# Patient Record
Sex: Female | Born: 1945
Health system: Southern US, Community
[De-identification: ages and names within clinical notes are randomized; demographics above are authoritative.]

## PROBLEM LIST (undated history)

## (undated) DIAGNOSIS — B9681 Helicobacter pylori [H. pylori] as the cause of diseases classified elsewhere: Secondary | ICD-10-CM

## (undated) DIAGNOSIS — I4891 Unspecified atrial fibrillation: Secondary | ICD-10-CM

## (undated) DIAGNOSIS — Z9889 Other specified postprocedural states: Secondary | ICD-10-CM

## (undated) DIAGNOSIS — K5281 Eosinophilic gastritis or gastroenteritis: Secondary | ICD-10-CM

## (undated) DIAGNOSIS — M1990 Unspecified osteoarthritis, unspecified site: Secondary | ICD-10-CM

## (undated) DIAGNOSIS — K5790 Diverticulosis of intestine, part unspecified, without perforation or abscess without bleeding: Secondary | ICD-10-CM

## (undated) DIAGNOSIS — E785 Hyperlipidemia, unspecified: Secondary | ICD-10-CM

## (undated) DIAGNOSIS — K219 Gastro-esophageal reflux disease without esophagitis: Secondary | ICD-10-CM

## (undated) DIAGNOSIS — E7800 Pure hypercholesterolemia, unspecified: Secondary | ICD-10-CM

## (undated) DIAGNOSIS — K279 Peptic ulcer, site unspecified, unspecified as acute or chronic, without hemorrhage or perforation: Secondary | ICD-10-CM

## (undated) HISTORY — DX: Eosinophilic gastritis or gastroenteritis: K52.81

## (undated) HISTORY — PX: CHOLECYSTECTOMY: SHX55

## (undated) HISTORY — DX: Gastro-esophageal reflux disease without esophagitis: K21.9

## (undated) HISTORY — DX: Helicobacter pylori (H. pylori) as the cause of diseases classified elsewhere: K27.9

## (undated) HISTORY — PX: BUNIONECTOMY: SHX129

## (undated) HISTORY — DX: Unspecified osteoarthritis, unspecified site: M19.90

## (undated) HISTORY — DX: Diverticulosis of intestine, part unspecified, without perforation or abscess without bleeding: K57.90

## (undated) HISTORY — PX: OTHER SURGICAL HISTORY: SHX169

## (undated) HISTORY — DX: Helicobacter pylori (H. pylori) as the cause of diseases classified elsewhere: B96.81

## (undated) HISTORY — DX: Unspecified atrial fibrillation: I48.91

## (undated) HISTORY — PX: CARPAL TUNNEL RELEASE: SHX101

## (undated) HISTORY — DX: Other specified postprocedural states: Z98.890

## (undated) HISTORY — PX: HAND SURGERY: SHX662

---

## 1999-09-27 ENCOUNTER — Ambulatory Visit (HOSPITAL_BASED_OUTPATIENT_CLINIC_OR_DEPARTMENT_OTHER): Admission: RE | Admit: 1999-09-27 | Discharge: 1999-09-27 | Payer: Self-pay | Admitting: Orthopedic Surgery

## 2000-12-26 ENCOUNTER — Encounter: Payer: Self-pay | Admitting: Rheumatology

## 2000-12-26 ENCOUNTER — Encounter (HOSPITAL_COMMUNITY): Admission: RE | Admit: 2000-12-26 | Discharge: 2001-01-25 | Payer: Self-pay | Admitting: Rheumatology

## 2001-08-13 ENCOUNTER — Ambulatory Visit (HOSPITAL_COMMUNITY): Admission: RE | Admit: 2001-08-13 | Discharge: 2001-08-13 | Payer: Self-pay | Admitting: Family Medicine

## 2001-08-13 ENCOUNTER — Encounter: Payer: Self-pay | Admitting: Family Medicine

## 2003-11-30 ENCOUNTER — Ambulatory Visit (HOSPITAL_COMMUNITY): Admission: RE | Admit: 2003-11-30 | Discharge: 2003-11-30 | Payer: Self-pay | Admitting: Internal Medicine

## 2004-03-19 DIAGNOSIS — Z9889 Other specified postprocedural states: Secondary | ICD-10-CM

## 2004-03-19 HISTORY — PX: COLONOSCOPY: SHX174

## 2004-03-19 HISTORY — DX: Other specified postprocedural states: Z98.890

## 2004-08-03 ENCOUNTER — Ambulatory Visit (HOSPITAL_COMMUNITY): Admission: RE | Admit: 2004-08-03 | Discharge: 2004-08-03 | Payer: Self-pay | Admitting: Family Medicine

## 2004-08-23 ENCOUNTER — Ambulatory Visit (HOSPITAL_COMMUNITY): Admission: RE | Admit: 2004-08-23 | Discharge: 2004-08-23 | Payer: Self-pay | Admitting: Family Medicine

## 2004-09-06 ENCOUNTER — Ambulatory Visit (HOSPITAL_COMMUNITY): Admission: RE | Admit: 2004-09-06 | Discharge: 2004-09-06 | Payer: Self-pay | Admitting: Internal Medicine

## 2004-09-06 ENCOUNTER — Ambulatory Visit: Payer: Self-pay | Admitting: Internal Medicine

## 2004-10-02 ENCOUNTER — Other Ambulatory Visit: Admission: RE | Admit: 2004-10-02 | Discharge: 2004-10-02 | Payer: Self-pay | Admitting: Obstetrics and Gynecology

## 2005-03-19 DIAGNOSIS — Z9889 Other specified postprocedural states: Secondary | ICD-10-CM

## 2005-03-19 HISTORY — DX: Other specified postprocedural states: Z98.890

## 2005-03-22 ENCOUNTER — Encounter (INDEPENDENT_AMBULATORY_CARE_PROVIDER_SITE_OTHER): Payer: Self-pay | Admitting: Specialist

## 2005-03-22 ENCOUNTER — Ambulatory Visit (HOSPITAL_BASED_OUTPATIENT_CLINIC_OR_DEPARTMENT_OTHER): Admission: RE | Admit: 2005-03-22 | Discharge: 2005-03-22 | Payer: Self-pay | Admitting: Orthopedic Surgery

## 2005-09-04 ENCOUNTER — Ambulatory Visit (HOSPITAL_COMMUNITY): Admission: RE | Admit: 2005-09-04 | Discharge: 2005-09-04 | Payer: Self-pay | Admitting: Family Medicine

## 2006-02-22 ENCOUNTER — Ambulatory Visit (HOSPITAL_COMMUNITY): Admission: RE | Admit: 2006-02-22 | Discharge: 2006-02-22 | Payer: Self-pay | Admitting: Family Medicine

## 2006-03-05 ENCOUNTER — Ambulatory Visit: Payer: Self-pay | Admitting: Internal Medicine

## 2006-03-13 ENCOUNTER — Ambulatory Visit: Payer: Self-pay | Admitting: Internal Medicine

## 2006-03-13 ENCOUNTER — Encounter (INDEPENDENT_AMBULATORY_CARE_PROVIDER_SITE_OTHER): Payer: Self-pay | Admitting: Specialist

## 2006-03-13 ENCOUNTER — Ambulatory Visit (HOSPITAL_COMMUNITY): Admission: RE | Admit: 2006-03-13 | Discharge: 2006-03-13 | Payer: Self-pay | Admitting: Internal Medicine

## 2006-04-15 ENCOUNTER — Ambulatory Visit: Payer: Self-pay | Admitting: Internal Medicine

## 2006-07-17 ENCOUNTER — Ambulatory Visit: Payer: Self-pay | Admitting: Internal Medicine

## 2006-09-09 ENCOUNTER — Ambulatory Visit (HOSPITAL_COMMUNITY): Admission: RE | Admit: 2006-09-09 | Discharge: 2006-09-09 | Payer: Self-pay | Admitting: Family Medicine

## 2007-03-06 ENCOUNTER — Ambulatory Visit: Payer: Self-pay | Admitting: Internal Medicine

## 2007-04-17 ENCOUNTER — Ambulatory Visit: Payer: Self-pay | Admitting: Internal Medicine

## 2007-04-21 ENCOUNTER — Ambulatory Visit (HOSPITAL_COMMUNITY): Admission: RE | Admit: 2007-04-21 | Discharge: 2007-04-21 | Payer: Self-pay | Admitting: Internal Medicine

## 2007-05-15 ENCOUNTER — Ambulatory Visit (HOSPITAL_COMMUNITY): Admission: RE | Admit: 2007-05-15 | Discharge: 2007-05-15 | Payer: Self-pay | Admitting: Family Medicine

## 2007-09-10 ENCOUNTER — Ambulatory Visit (HOSPITAL_COMMUNITY): Admission: RE | Admit: 2007-09-10 | Discharge: 2007-09-10 | Payer: Self-pay | Admitting: Family Medicine

## 2007-10-06 ENCOUNTER — Emergency Department (HOSPITAL_COMMUNITY): Admission: EM | Admit: 2007-10-06 | Discharge: 2007-10-06 | Payer: Self-pay | Admitting: Emergency Medicine

## 2007-11-17 DIAGNOSIS — A048 Other specified bacterial intestinal infections: Secondary | ICD-10-CM | POA: Insufficient documentation

## 2007-11-17 DIAGNOSIS — K5289 Other specified noninfective gastroenteritis and colitis: Secondary | ICD-10-CM | POA: Insufficient documentation

## 2007-11-17 DIAGNOSIS — R12 Heartburn: Secondary | ICD-10-CM | POA: Insufficient documentation

## 2007-11-17 DIAGNOSIS — R1319 Other dysphagia: Secondary | ICD-10-CM | POA: Insufficient documentation

## 2007-11-17 DIAGNOSIS — R110 Nausea: Secondary | ICD-10-CM | POA: Insufficient documentation

## 2007-11-17 DIAGNOSIS — K219 Gastro-esophageal reflux disease without esophagitis: Secondary | ICD-10-CM | POA: Insufficient documentation

## 2007-11-17 DIAGNOSIS — K222 Esophageal obstruction: Secondary | ICD-10-CM | POA: Insufficient documentation

## 2007-11-17 DIAGNOSIS — Z9089 Acquired absence of other organs: Secondary | ICD-10-CM | POA: Insufficient documentation

## 2007-11-17 DIAGNOSIS — R1110 Vomiting, unspecified: Secondary | ICD-10-CM | POA: Insufficient documentation

## 2007-11-17 DIAGNOSIS — R197 Diarrhea, unspecified: Secondary | ICD-10-CM | POA: Insufficient documentation

## 2007-11-17 DIAGNOSIS — M4726 Other spondylosis with radiculopathy, lumbar region: Secondary | ICD-10-CM | POA: Insufficient documentation

## 2007-11-17 DIAGNOSIS — Z9189 Other specified personal risk factors, not elsewhere classified: Secondary | ICD-10-CM | POA: Insufficient documentation

## 2007-11-17 DIAGNOSIS — R634 Abnormal weight loss: Secondary | ICD-10-CM | POA: Insufficient documentation

## 2007-11-17 DIAGNOSIS — K449 Diaphragmatic hernia without obstruction or gangrene: Secondary | ICD-10-CM | POA: Insufficient documentation

## 2008-12-06 ENCOUNTER — Ambulatory Visit (HOSPITAL_COMMUNITY): Admission: RE | Admit: 2008-12-06 | Discharge: 2008-12-06 | Payer: Self-pay | Admitting: Family Medicine

## 2008-12-08 ENCOUNTER — Ambulatory Visit (HOSPITAL_COMMUNITY): Admission: RE | Admit: 2008-12-08 | Discharge: 2008-12-08 | Payer: Self-pay | Admitting: Family Medicine

## 2008-12-09 ENCOUNTER — Encounter (HOSPITAL_COMMUNITY): Admission: RE | Admit: 2008-12-09 | Discharge: 2008-12-15 | Payer: Self-pay | Admitting: Family Medicine

## 2009-01-18 ENCOUNTER — Ambulatory Visit (HOSPITAL_COMMUNITY): Admission: RE | Admit: 2009-01-18 | Discharge: 2009-01-18 | Payer: Self-pay | Admitting: Family Medicine

## 2009-06-15 ENCOUNTER — Ambulatory Visit (HOSPITAL_COMMUNITY)
Admission: RE | Admit: 2009-06-15 | Discharge: 2009-06-15 | Payer: Self-pay | Source: Home / Self Care | Admitting: Family Medicine

## 2009-06-16 ENCOUNTER — Telehealth (INDEPENDENT_AMBULATORY_CARE_PROVIDER_SITE_OTHER): Payer: Self-pay

## 2009-11-11 ENCOUNTER — Ambulatory Visit: Payer: Self-pay | Admitting: Internal Medicine

## 2009-11-11 DIAGNOSIS — R143 Flatulence: Secondary | ICD-10-CM

## 2009-11-11 DIAGNOSIS — R141 Gas pain: Secondary | ICD-10-CM | POA: Insufficient documentation

## 2009-11-11 DIAGNOSIS — R142 Eructation: Secondary | ICD-10-CM

## 2009-12-26 ENCOUNTER — Telehealth (INDEPENDENT_AMBULATORY_CARE_PROVIDER_SITE_OTHER): Payer: Self-pay

## 2010-03-08 ENCOUNTER — Telehealth (INDEPENDENT_AMBULATORY_CARE_PROVIDER_SITE_OTHER): Payer: Self-pay

## 2010-03-23 ENCOUNTER — Ambulatory Visit (HOSPITAL_COMMUNITY)
Admission: RE | Admit: 2010-03-23 | Discharge: 2010-03-23 | Payer: Self-pay | Source: Home / Self Care | Attending: Family Medicine | Admitting: Family Medicine

## 2010-04-09 ENCOUNTER — Encounter: Payer: Self-pay | Admitting: Internal Medicine

## 2010-04-18 NOTE — Assessment & Plan Note (Signed)
Summary: FU ON MEDICATIONS,ACID REFLUX/SS   Primary Care Provider:  [facility]  Chief Complaint:  F/U needs to keep getting Nexium.  History of Present Illness:  History of GERD well-controlled on Nexium 40 mg orally daily when she can take it. She has not ever had a prescription filled.   She appeared to get by with samples from this office and office of PCP. She is out of her supply at this time. No dysphagia. She does have gas bloating and constipation on occasion; prior EGD demonstrated a Schatzki's ring in 2007. She had no esophagitis. Last colonoscopy was in June of 2006. She had scattered diverticulosis. She is due for routine screening colonoscopy 2016. She does not have any melena rectal bleeding. She denies dysphagia.   Problems Prior to Update: 1)  Hx of Osteoarthritis  (ICD-715.90) 2)  Cholecystectomy, Hx of  (ICD-V45.79) 3)  Abdominal Pain, Hx of  (ICD-V15.89) 4)  Gerd  (ICD-530.81) 5)  Diarrhea  (ICD-787.91) 6)  Hx of Weight Loss  (ICD-783.21) 7)  Nausea  (ICD-787.02) 8)  Vomiting  (ICD-787.03) 9)  Hx of Helicobacter Pylori Gastritis  (ICD-041.86) 10)  Hx of Schatzki's Ring  (ICD-530.3) 11)  Heartburn  (ICD-787.1) 12)  Eosinophilic Gastroenteritis  (ICD-558.9) 13)  Hiatal Hernia  (ICD-553.3) 14)  Other Dysphagia  (ZOX-096.04)  Current Medications (verified): 1)  Vitamin C .... Take 1 Tablet By Mouth Once A Day 2)  Zinc .... Take 1 Tablet By Mouth Once A Day 3)  Vitamin D .... Take 1 Tablet By Mouth Once A Day 4)  Vicodin 5-500 Mg Tabs (Hydrocodone-Acetaminophen) .... Take One Tablet By Mouth As Needed 5)  Lopid 600 Mg Tabs (Gemfibrozil) .... Take 1 Tablet By Mouth Two Times A Day 6)  Calcium 600 Mg .... Take 1 Tablet By Mouth Once A Day 7)  Vitamin D 5000 Iu .... Take 1 Tablet By Mouth Once A Day 8)  Omeprazole 20 Mg Cpdr (Omeprazole) .... Take 1 Tablet By Mouth Once A Day 9)  Advil .... As Needed  Allergies (verified): 1)  ! Aspirin 2)  ! Celebrex 3)  !  Fosamax 4)  ! Vioxx 5)  ! * Vytorin  Past History:  Past Surgical History: Last updated: 11/17/2007 Hysterectomy C-Section Cholecystectomy Bladder tacking Feet surgery Hand surgery  Family History: Last updated: 11-24-09 Father: Deceased age 41   CVA Mother: Deceased age 72   unknown Siblings: 70 2 living, healthy Deceased.Marland KitchenMarland KitchenEmphysema, lung cancer, abd aneurysm  Social History: Last updated: 11/24/2009 Marital Status: Married Children: 1 Occupation: Gaffer  Past Medical History: Esinophilic gastroenteritis H. Pylori Chronic Esophageal Reflux Dysphagia Hx Schatski's Ring Osteoarthritis Hx Diverticulosis  Family History: Father: Deceased age 50   CVA Mother: Deceased age 24   unknown Siblings: 52 2 living, healthy Deceased.Marland KitchenMarland KitchenEmphysema, lung cancer, abd aneurysm  Social History: Marital Status: Married Children: 1 Occupation: Retired/ Textile  Vital Signs:  Patient profile:   65 year old female Height:      64 inches Weight:      166 pounds BMI:     28.60 Temp:     98.7 degrees F oral Pulse rate:   72 / minute BP sitting:   100 / 64  (left arm) Cuff size:   regular  Vitals Entered By: Cloria Spring LPN (11-24-2009 1:40 PM)  Physical Exam  General:  pleasant alert lady appears at baseline Abdomen:  Soft, nontender and nondistended. No masses, hepatosplenomegaly or hernias noted. Normal bowel sounds.  Impression &  Recommendations: Impression:  Pleasant lady with GERD, well-controlled on Nexium when she has it to take. No alarm symptoms. I discussed the chronic nature of reflux with this nice ladyr today. Gas bloat symptoms non-specific.Colonoscopy not too long ago.  Recommendations: Continue Nexium 40 mm neurally daily;  prescription given and samples today  Probiotic for gas / bloat /constipation.  Unless something comes up, will plan to see this lady back in one year.  Appended Document: Orders Update    Clinical Lists  Changes  Problems: Added new problem of ABDOMINAL BLOATING (ICD-787.3) Orders: Added new Service order of Est. Patient Level III (81191) - Signed      Appended Document: FU ON MEDICATIONS,ACID REFLUX/SS REMINDER IN COMPUTER

## 2010-04-18 NOTE — Progress Notes (Signed)
Summary: samples  Phone Note Call from Patient   Caller: Patient Summary of Call: pt came to office visit for another pt and requested samples of Nexium. Ok for #30 per RMR but pt will need to make an OV before we can give anymore. Initial call taken by: Hendricks Limes LPN,  June 16, 2009 9:13 AM

## 2010-04-18 NOTE — Progress Notes (Signed)
Summary: nexium samples  Phone Note Call from Patient Call back at Home Phone [phone]   Caller: Patient Summary of Call: pt called requesting nexium samples # 20 given. Initial call taken by: Hendricks Limes LPN,  December 26, 2009 9:39 AM

## 2010-04-20 NOTE — Progress Notes (Signed)
Summary: nexium samples  Phone Note Call from Patient Call back at Home Phone [phone]   Caller: Patient Summary of Call: pt called to see if we have Nexium samples. She asked for them during her sisters OV with RMR. Left #20 at front desk. pt aware Initial call taken by: Hendricks Limes LPN,  March 08, 2010 10:22 AM

## 2010-08-01 NOTE — Assessment & Plan Note (Signed)
NAME:  ZARIN, HAGMANN           CHART#:  [id number]   DATE:  04/17/2007                       DOB:  [birth year]   CHIEF COMPLAINT:  Followup eosinophilic gastroenteritis.   SUBJECTIVE:  The patient is a 65 year old female.  She was initially  treated with prednisone for eosinophilic gastroenteritis one year ago.  She was tapered off of prednisone.  More recently she was seen in  December by Dr. Jena Gauss.  She was still having early morning nausea.  She  has previously been treated for H. pylori.  She was started on Zegerid  40 mg b.i.d., she is now on Prevacid 30 mg b.i.d.  She was found to have  an eosinophil count of 9% on 03/06/2007.  She has rare breakthrough  heartburn and indigestion.  She denies any nausea or vomiting.  Her  weight has remained stable.  She denies any anorexia.  She has been  having some dysphagia for about a year now.  She has problems with both  liquids and solids, points to the back of her throat or her upper  esophagus.  She did have a Schatzki's ring which was not manipulated on  previous EGD.  She does complain of some water brash.  She denies any  history of allergies or asthma.  Denies any rectal bleeding or melena,  denies any diarrhea or constipation.   CURRENT MEDICATIONS:  See updated list from 04/17/2007.   Allergy to ASPIRIN, CELEBREX, FOSAMAX, VIOXX and VYTORIN.   OBJECTIVE:  Weight 158 pounds, height 64 inches, temp 97.6, blood  pressure 110/74 and pulse 64.  GENERAL:  She is a well-developed, well-nourished female in no acute  distress.  HEENT:  Sclerae clear, non injected.  Conjunctivae pink.  Oropharynx  pink and moist without any lesions.  CHEST:  Heart regular rate and rhythm.  Normal S1, S2.  ABDOMEN:  Positive bowel sounds x4.  No bruits auscultated, soft,  nontender, nondistended.  No palpable masses or hepatosplenomegaly.  No  rebound, tenderness or guarding.  EXTREMITIES:  Without clubbing or edema bilaterally.  SKIN:  Pink,  warm, and dry without any rashes or jaundice.   ASSESSMENT:  1. The patient is a 65 year old female with history of eosinophilic      gastroenteritis.  She did well with a course of prednisone last      year.  2. She has been H. pylori positive, status post treatment.  3. She has chronic gastroesophageal reflux disease, well controlled at      this time.  4. She does have continued peripheral eosinophilia with most recent      eosinophil count 8%; however, it is asymptomatic at this time.  5. Dysphagia for one year.  I wonder if it is possible that she could      have eosinophilic esophagitis, although I am not convinved that she      is having esophageal dysphagia.  She may need to have dilatation of      previous Schatski's ring.  Will discuss further with Dr. Jena Gauss.   PLAN:  1. Continue Prevacid 30 mg b.i.d., and I have given her a week's worth      of samples.  2. Will discuss further with Dr. Jena Gauss and make recommendations soon.       Lorenza Burton, N.P.  Electronically Signed     R.  Roetta Sessions, M.D.  Electronically Signed    KJ/MEDQ  D:  04/17/2007  T:  04/17/2007  Job:  [id number]   cc:   Patrica Duel, M.D.

## 2010-08-01 NOTE — Assessment & Plan Note (Signed)
NAME:  Patricia Hodges, Patricia Hodges           CHART#:  [id number]   DATE:  03/06/2007                       DOB:  Jul 24, 1945   Followup probable essential gastroenteritis.  The patient was seen the  first of this year.  She had increasing peripheral eosinophil count.  Biopsies did not, however, reveal increased eosinophils in gastric or  duodenal mucosa.  No evidence of celiac disease or other abnormality of  the GI tract.  She responded nicely to a brief course of prednisone.  She had evidence of H. pylori, for which she took triple drug therapy  for 2 weeks.  She has done well.  She does experience early morning  nausea, but hardly ever vomits.  She says just 2 days ago she did have  an episode of vomiting in the morning and it subsided.  She wanted to  come in and get checked out.  She has not had any abdominal pain.  Her  gallbladder is out.  She asks if she could have hepatitis.  She has not  had any yellow jaundice, clay colored stools, or dark colored urine.  No  fevers or chills.  Colonoscopy 2006 demonstrated only diverticulosis.  Her weight is down 5 pounds from what it was earlier in the year.  She  has not had a repeat CBC with differential recently.  She has been  taking Prevacid 30 mg orally b.i.d.  Sometimes takes Prevacid evening  dose at bedtime.  The patient has been experiencing some typical reflux  symptoms recently.   CURRENT MEDICATIONS:  See updated list.   ALLERGIES:  Aspirin, Celebrex, Fosamax, Vioxx, Vytorin.   EXAM:  She appears in no acute distress.  Weight 165, height 5 feet 4, temperature 98.1, BP 102/70, pulse 76.  SKIN:  Warm and dry.  There is no jaundice or stigmata of chronic liver  disease.  HEENT:  No scleral icterus.  Conjunctivae pink.  CHEST AND LUNGS:  Clear to auscultation.  CARDIOVASCULAR:  Regular rate and rhythm without murmur, gallop, or rub.  ABDOMEN:  Nondistended.  Positive bowel sounds.  Soft and nontender.  No  mass or organomegaly.  EXTREMITIES:  No edema.   IMPRESSION:  Early morning nausea, history of probable eosinophilic  gastroenteritis, history of H. pylori treated.  She did improve  considerably with a course of prednisone previously.  She did have some  nausea and vomiting, which was self-limiting 2 days ago.  She has  chronic early morning nausea and has had some breakthrough reflux  symptoms recently.   I doubt she has hepatitis but this is a concern of hers.   RECOMMENDATIONS:  CBC with differential, hepatic profile.  Stop  Prevacid.  Begin Zegerid 40 mg orally twice daily, i.e. before breakfast  and supper.  She will let us know in 2 weeks how she is doing one way or  the other, and we will go from there.       Jonathon Bellows, M.D.  Electronically Signed     RMR/MEDQ  D:  03/06/2007  T:  03/06/2007  Job:  [id number]   cc:   Patrica Duel, M.D.

## 2010-08-04 NOTE — Op Note (Signed)
NAME:  NARE, GASPARI          ACCOUNT NO.:  [id number]   MEDICAL RECORD NO.:  [id number]          PATIENT TYPE:  AMB   LOCATION:  DSC                          FACILITY:  [facility]   PHYSICIAN:  Katy Fitch. Sypher, M.D. DATE OF BIRTH:  1945/07/07   DATE OF PROCEDURE:  03/22/2005  DATE OF DISCHARGE:                                 OPERATIVE REPORT   PREOPERATIVE DIAGNOSIS:  Severe degenerative arthritis right ring finger PIP  joint.   POSTOPERATIVE DIAGNOSIS:  Severe degenerative arthritis right ring finger  PIP joint.   OPERATION:  Is a size 2 [facility] Silastic implant arthroplasty, right  ring finger PIP joint.   OPERATIONS:  Katy Fitch. Sypher, M.D.   ASSISTANT:  Molly Maduro Dasnoit PA-C.   ANESTHESIA:  Is general by LMA, supervising anesthesiologist is Dr.  Jacklynn Bue.   INDICATIONS:  Patricia Hodges is a 65 year old woman self-referred for  evaluation and management of severe hand pain.   She had a history of extensive degenerative arthritis of her interphalangeal  joints and particular pain and deformity of her right ring finger PIP joint.   We had a lengthy discussion in the office regarding the issues with  osteoarthrosis of the hand.   While we cannot perform implant arthroplasty of all of her joints, the most  symptomatic joints can be relieved by implant arthroplasty technique.   After lengthy informed consent, Ms. Evitt has requested that we  reconstruct her right ring finger PIP joint with a primary indication of  pain relief and secondary indication of improvement of range of motion.   PROCEDURE:  Teanna Elem is brought to the operating room and placed  in supine position on the table.   Following the induction of general anesthesia by LMA technique under direct  supervision of Dr. Jacklynn Bue, the right arm was prepped with Betadine soap  and solution, sterilely draped. A pneumatic tourniquet was applied to  proximal brachium.   Following  exsanguination of right arm with an Esmarch bandage, an arterial  tourniquet on proximal brachium was inflated to 220 mmHg.   1 gram of Ancef was administered as IV prophylactic antibiotic in the  holding area.   Procedure commenced with curvilinear incision exposing extensor mechanism  overlying the ring finger PIP joint.   The extensor was split in a T manner elevating the central slip off the base  of the middle phalanx.   The collateral ligaments were released proximally followed by complete  synovectomy of the PIP joint.   The hypertrophic osteophytes of the condyles of proximal phalanx were  resected to allow identification of the true neck of the proximal phalanx.   An oscillating saw was used to remove the proximal phalangeal head at a line  perpendicular to long axis of the proximal phalanx shaft.   The middle phalangeal joint surface was then leveled with a power bur  followed by use of rongeur to remove the marginal osteophytes.   After thorough synovectomy of the synovial recesses, a bone awl was used to  sound the intramedullary canals. The canals were enlarged with a Swanson  reamer followed by placement of a  size 2 trial implant.   AP lateral images documented satisfactory position of the implant followed  by drill holes at the base of the middle phalanx to reconstruct the central  slip insertion.   After irrigation with sterile saline. A size 2 implant was placed with no-  touch technique. A 0.035-inch Kirschner wire was placed down the flexor  sheath to maintain extension of the DIP and PIP joints. The central slip was  then reconstructed with a grasping suture of 4-0 FiberWire followed by  repair of the extensor split with a running suture of 4-0 Mersilene. The  wound was repaired with intradermal 3-0 Prolene followed by Steri-Strips. A  voluminous gauze dressing was applied with sterile gauze, sterile Webril,  and a hand sandwich splint maintaining the long  ring and small fingers in  the safe position.   There were no apparent complications noted.      Katy Fitch Sypher, M.D.  Electronically Signed     RVS/MEDQ  D:  03/22/2005  T:  03/22/2005  Job:  [id number]   cc:   Katy Fitch. Sypher, M.D.  Fax: [phone]

## 2010-08-04 NOTE — Consult Note (Signed)
NAME:  Patricia Hodges, Patricia Hodges          ACCOUNT NO.:  [id number]   MEDICAL RECORD NO.:  [id number]          PATIENT TYPE:  AMB   LOCATION:  DAY                           FACILITY:  [facility]   PHYSICIAN:  R. Roetta Sessions, M.D. DATE OF BIRTH:  Nov 25, 1945   DATE OF CONSULTATION:  03/05/2006  DATE OF DISCHARGE:                                 CONSULTATION   REASON FOR CONSULTATION:  Intermittent nausea, vomiting, diarrhea.   HISTORY OF PRESENT ILLNESS:  Patricia Hodges is a very pleasant  65 year old Caucasian female, sent over at the courtesy of Dr. Patrica Hodges, to further evaluate essentially a one-year-history of episodes  of nausea, vomiting and diarrhea.  She tells me she has had six episodes  since January of this year, almost always awakening her from a sound  sleep at night.  The symptoms are characterized by sick or nauseated  feeling,  that wakes her from a sound sleep.  She feels that she gets  chills, but has never had a documented fever.  This is followed by  nausea, vomiting and intermittent non-bloody diarrhea.  The attack may  peak within an hour and slowly subside over the subsequent 24 hours.  No  known precipitating factors and almost always occurs in the evening  hours and mostly after she has gone to bed.  She has seen Dr. Nobie Putnam  for these symptoms.  Giardia angio testing came back negative.  She had  an upper GI series which revealed a small hiatal hernia and a small  gastric diverticulum.  The ultrasound of the abdomen demonstrated  minimal fatty infiltration of the liver.  She is status post  cholecystectomy, otherwise no significant findings.   LABORATORY DATA:  Evaluation also revealed low-titer positive H. pylori  serologies at 1.2.  A CBC with differential revealed an elevated  eosinophil count of 9% with the upper limits of normal being 4%.  Her  liver function tests were normal.   Patricia Hodges does drink well water.  No one else at home has been  ill.  She really has not had any significant abdominal pain with her symptoms.  She has not been jaundiced.  Her gallbladder was removed some 20 years  ago because of symptomatic gallstones.   She has a three to four-year-history of rather prominent  gastroesophageal reflux disease symptoms and requires Prevacid 30 mg  orally b.i.d. for control.  She has had no odynophagia or dysphagia or  early satiety.  She has not lost any weight.  She has not had any melena  or rectal bleeding.  She typically has one bowel movement daily when she  does not have bouts of diarrhea.   She previously was on Fosamax some time ago because of atypical chest  pain.  That medication was stopped.   PAST MEDICAL HISTORY:  Significant for osteoarthritis.   PAST SURGICAL HISTORY:  1. Hysterectomy.  2. C-section.  3. Cholecystectomy.  4. Bladder tacking.  5. Feet and hand surgery.  6. She is status post a screening colonoscopy by me on September 06, 2004,      which demonstrated scattered  pan-colonic diverticula.   CURRENT MEDICATIONS:  1. Vicodin p.r.n.  2. Prevacid 30 mg p.o. b.i.d.  3. Vitamin C, glucosamine, coral calcium.   ALLERGIES:  ASPIRIN, CELEBREX, VIOXX, FOSAMAX.   FAMILY HISTORY:  Father died with a CVA.  Mother died at age 74, cause  unknown.  Otherwise no history of chronic GI or liver illness.   SOCIAL HISTORY:  The patient is married.  She has one child.  She is  disabled.  She stopped smoking some 20 years ago.  No alcohol.   REVIEW OF SYSTEMS:  No chest pain, no dyspnea on exertion.  No change in  weight.  No fever, but chills as outlined above.  Otherwise as per the  history of present illness.   PHYSICAL EXAMINATION:  GENERAL:  A pleasant 65 year old lady resting  comfortably.  VITAL SIGNS:  Weight 170.5 pounds, height 5 feet 4 ounces, temperature  98 degrees, blood pressure 128/80, pulse 68.  SKIN:  Warm and dry.  No jaundice.  No stigmata of chronic liver  disease.  HEENT:  No  scleral icterus.  Conjunctivae pink.  Oral cavity without  lesions.  NECK:  Jugular venous distention is not prominent.  CHEST:  Lungs clear to auscultation.  CARDIOVASCULAR:  A regular rate and rhythm, without murmur, gallop or  rub.  ABDOMEN:  Non-distended.  Positive bowel sounds.  Abdomen is soft and  entirely nontender to palpation.  No appreciable hepatosplenomegaly.  EXTREMITIES:  No edema.   IMPRESSION:  1. Ms. Patricia Hodges is a very pleasant 65 year old lady with      recurrent episodes of intermittent nausea, vomiting and non-bloody      diarrhea for one year's duration.  She has relative __________ of      any abdominal pain with these symptoms and she feels fairly well in      between episodes.  She has had approximately six in the past year.  2. She has longstanding gastroesophageal reflux disease requiring      b.i.d. proton pump inhibition therapy.  3. She also has positive Helicobacter pylori serologies.   NOTATION:  I note that she does have an elevated peripheral eosinophil  count.   The differential at this point in time would include eosinophilic  gastroenteritis or much less likely cyclical vomiting syndrome.  I doubt  her positive Helicobacter pylori serologies or her gastroesophageal  reflux disease have anything to do with these episodic acute symptoms;  however, all of these issues need to be addressed further.   RECOMMENDATIONS:  1. Will proceed with a diagnostic EGD in the near future, to rule out      complicated gastroesophageal reflux disease, but as importantly to      in all likelihood perform biopsies of the gastric and small bowel      mucosa, to rule out eosinophilic gastroenteritis.  2. Because she has positive H. pylori serologies and H. pylori is a      class one carcinogen, would make plans to treat for this organism,      but will hold off until we have sorted her recent recurrent acute     GI symptoms.  I discussed this approach with  Ms. Puller.  The      potential risks, benefits and alternatives have been reviewed and      questions answered.  She is agreeable.  Will make further      recommendations in the very near future.  Will go ahead and take  the liberty of repeating a CBC with differential at this time.   I would like to thank Dr. Nobie Putnam for allowing me to see this nice lady  today in consultation.      Jonathon Bellows, M.D.  Electronically Signed     RMR/MEDQ  D:  03/05/2006  T:  03/05/2006  Job:  [id number]   cc:   Patrica Hodges, M.D.  Fax: [phone]

## 2010-08-04 NOTE — Op Note (Signed)
[facility]  Patient:    Patricia Hodges, Patricia Hodges                   MRN: [id number] Proc. Date: 09/27/99 Adm. Date:  [id number] Attending:  Sypher, Douglass Rivers CC:         Katy Fitch. Sypher, Montez Hageman., M.D. (2)                           Operative Report  PREOPERATIVE DIAGNOSIS: 1. Entrapment neuropathy, median nerve, left carpal tunnel. 2. Severe degenerative arthritis with inflammatory synovitis of index and    long finger proximal interphalangeal joints.  POSTOPERATIVE DIAGNOSIS: 1. Entrapment neuropathy, median nerve, left carpal tunnel. 2. Severe degenerative arthritis with inflammatory synovitis of index and    long finger proximal interphalangeal joints.  OPERATION PERFORMED: 1. Release of left transverse carpal ligament. 2. Injection of left index finger proximal interphalangeal joint capsule    with Aristospan and lidocaine 1%. 2. Injection of left long finger proximal interphalangeal joint capsule and    synovium with Aristospan and lidocaine 1%.  OPERATING SURGEON:  Josephine Igo, M.D.  ANESTHESIA:  General by mask.  SUPERVISING ANESTHESIOLOGIST:  Dr. Gelene Mink.  DESCRIPTION OF PROCEDURE:  [name] was brought to the operating room and placed in supine position on the operating table.  Following anesthesia, the left arm was prepped with Betadine soap and solution and sterilely draped.  The left arm was exsanguinated with an Esmarch bandage and the arterial tourniquet was inflated to 220 mmHg.  The procedure commenced with a short incision in line of the ring finger in the palm.  The subcutaneous tissues were carefully divided revealing the palmar fascia.  This was split longitudinally to reveal the common sensory branch of the median nerve.  These were followed back to the transverse carpal ligament which was carefully separated from the median nerve.  The ligament was released on its ulnar border extending into the distal  forearm.  This widely opened the carpal canal.  No masses or other predicaments were noted.  Bleeding points were electrocauterized with bipolar forceps followed by repair of the skin with intradermal 3-0 Prolene.  Attention was then directed to the index and long finger PIP joint.  A 50/50 mixture of 1% lidocaine and Aristospan was injected for a total volume of 0.7 cc into each PIP joint capsule.  This was well tolerated.  The hand was then dressed with a voluminous gauze dressing, sterile Webril and a volar plaster splint maintaining the wrist in 5 degrees dorsiflexion.  She was discharged to the recovery room with stable vital signs.  She will be discharged to the care of her family with prescriptions for Percocet 5/325 1 to 2 tablets p.o. q.4-6h. p.r.n. pain, 20 tablets without refill.DD:  09/27/99 TD:  09/27/99 Job: [id number]

## 2010-08-04 NOTE — Op Note (Signed)
NAME:  Patricia Hodges, Patricia Hodges          ACCOUNT NO.:  [id number]   MEDICAL RECORD NO.:  [id number]          PATIENT TYPE:  AMB   LOCATION:  DAY                           FACILITY:  [facility]   PHYSICIAN:  R. Roetta Sessions, M.D. DATE OF BIRTH:  [birth year]   DATE OF PROCEDURE:  03/13/2006  DATE OF DISCHARGE:                               OPERATIVE REPORT   INDICATIONS FOR PROCEDURE:  The patient is a 60-year lady with  intermittent nausea, vomiting, sparse diarrhea, Giardia antigen testing  negative through Dr. Loraine Leriche Cresenzo's office.  She did have an elevated  peripheral eosinophil count recently with Dr. Nobie Putnam (9%).  I repeated  that on 03/06/2006 and the eosinophil count was elevated at 11%.  EGD is  now being done.  This approach has been discussed with the patient at  length.  Potential risks, benefits and alternatives have been reviewed,  questions were answered.  She is agreeable.  Please see documentation in  the patient's medical record.   PROCEDURE NOTE:  O2 saturation, blood pressure, pulse, and respirations  monitored during the entire procedure.  Conscious sedation with Versed 4  mg IV, Demerol 100 mg IV in divided doses.  Cetacaine spray for topical  pharyngeal anesthesia.  Instrument:  Olympus Pentax system.   FINDINGS:  Examination of tubular esophagus revealed a noncritical  Schatzki ring, otherwise esophageal mucosa appeared entirely normal.  EG  junction easily traversed, stomach and gastric cavity was empty.  Insufflated well with air.  Thorough examination of the gastric mucosa,  retroflexion to proximal stomach, esophagogastric junction, demonstrated  a single fundal diverticulum, a very small hiatal hernia.  There were  linear gastric erosions, otherwise gastric mucosa appeared normal and  patent.  Pylorus was patent and easily traversed. Examination of the  bulb, second and third portion, revealed no abnormalities.  Therapeutic/diagnostic maneuvers were performed  with biopsies of second  and third portion of the duodenum were taken for histologic study.  The  scope was pulled back into the antrum, and biopsies of the gastric  mucosa were taken as well.  The patient tolerated the procedure well.   IMPRESSION:  1. Normal esophagus aside from a noncritical Schatzki ring, not      manipulated.  2. Small hiatal hernia.  3. Single fundal diverticulum.  4. Antral erosions of uncertain significance.  5. Patent pylorus.  6. Normal D1 through D3.  7. Status post biopsies D2, D3, and the antrum.   I expect the patient may well have eosinophilic gastroenteritis and will  probably benefit from a course of  corticosteroid therapy.  We will  follow up on biopsies and make further recommendations in the very near  future.      Jonathon Bellows, M.D.  Electronically Signed     RMR/MEDQ  D:  03/13/2006  T:  03/13/2006  Job:  [id number]   cc:   Patrica Duel, M.D.  Fax: [phone]

## 2010-08-04 NOTE — Consult Note (Signed)
[facility]  Patient:    Patricia Hodges, Patricia Hodges Visit Number: [id number] MRN: [id number]          Service Type: RHE Location: [facility] Attending Physician:  Aundra Dubin Dictated by:   Aundra Dubin, M.D. Proc. Date: 12/26/00 Admit Date:  12/26/2000   CC:         Patrica Duel, M.D.   Consultation Report  CHIEF COMPLAINT:  Arthritis.  Dear Loraine Leriche:  Thank you for this consultation.  Patricia Hodges is a 65 year old white female who has had several years of swelling to the DIPs.  However, about a month to six weeks ago she began hurting all through the shoulders, down to the elbows, and into the hands.  The shoulders seemed to be worse. She was very stiff.  She is achy in the mornings for about one hour before warming up.  She has found that hot water helps her hands.  The shoulders have kept her awake at night but this has improved some.  She received a cortisone injection and she feels that this helped for about one week.  She has significant intolerances to NSAIDs and she says they "tear my stomach up." Other areas that were involved were her knees and ankles.  She feels that she is about 40% improved from six weeks ago.  Also, the wrists were some involved.  The only swollen joints were the hands to the forearms.  She was very active doing yard work and other tasks and people told her that she was doing too much.  Laboratories from May 29, 2000 show AST 23, ALT 18, albumin 4.3, TSH 2.3.  WBC 5.2, hemoglobin 13.7, platelets 247,000.  Glucose 94, creatinine 0.8.  Cholesterol 209.  On further review of systems there has been no weight loss.  She denies fever and rashes.  Her energy level may be a little down but she says it is still pretty good.  She is generally sleeping okay.  There has been no psoriasis, oral ulcers, alopecia, pleurisy, or Raynauds.  She has significant heartburn, but no blood to the bowel movement.  She continues to have a  great deal of back pain in the low back.  There is an area that has bothered her for years. She is quite stiff in the back in the mornings.  PAST MEDICAL HISTORY/PAST SURGICAL HISTORY:  Hysterectomy 1966, cholecystectomy, bladder tack, right foot surgery to the first, third, fourth, and fifth toes and the left second toe, carpal tunnel surgery 2000, reflux.  MEDICATIONS: 1. Nexium 40 mg q.d. 2. Estrace 1 mg q.d. 3. Prozac 20 mg q.d.  DRUG INTOLERANCES:  FOSAMAX, CELEBREX, VIOXX all bother her stomach.  FAMILY HISTORY:  Her father died at age 61 after a stroke.  Her mother died at age 53, was in a nursing home.  Evidently, she had an ankle ulcer and some type of infection near the end of her life.  SOCIAL HISTORY:  She is originally from [place], [place].  She has lived in [place] for most of her life.  She has one grown son.  She lives with her husband.  She is not working since BF closed.  She completed the eighth grade. She has smoked off and on for a number of years and last quit 15 years ago. No alcohol.  PHYSICAL EXAMINATION  VITAL SIGNS:  Weight 176 pounds, height 5 feet 6 inches, blood pressure 120/80, respirations 16.  GENERAL:  She is well-nourished, well-developed.  SKIN:  Unremarkable.  The skin appears to be brown from tanning in a tanning bed.  HEENT:  Normal hair pattern.  PERRL/EOMI.  Mouth:  No ulcers or petechiae.  NECK:  Slight upper cervical adenopathy, nontender thyroid.  LUNGS:  Clear.  HEART:  Regular with no murmur.  ABDOMEN:  Negative HSM, nontender.  MUSCULOSKELETAL:  The hands show swollen DIPs.  They are not tender.  There are several PIPs that are swollen, particularly the left second and third and they are slightly boggy and quite tender.  On the right hand there is less swelling and they are less tender.  The MCPs appear somewhat swollen or full and are slightly tender only on the left side.  The wrists are cool and there is no  guarding with flexion.  They are mildly tender.  Elbows:  Full range of motion.  She has tenderness to the medial right elbow.  Shoulders have a good fluid range of motion and show little resistance.  Trigger points at the elbows, shoulder, neck, occiput, anterior chest, and upper paraspinous muscles were tender.  She was quite tender in the bilateral SI joint areas.  Hips: Good range of motion.  Knees were cool.  Flexes lead to 130 degrees and were nontender.  Ankles were cool, nonswollen, and nontender.  She was nontender over the compression across the MTPs.  NEUROLOGIC:  Strength 5/5.  DTRs 2+ throughout.  Negative SLRs.  With movement of the legs she complained mostly of hurting in the low back.  ASSESSMENT AND PLAN: 1. Suspected osteoarthritis.  I will have her go for x-rays of the hands to    better evaluate this.  She does have slight fullness and tenderness to the    left metacarpophalangeal joints and the bilateral wrists.  Also in the    differential could be an involving inflammatory arthritis.  I will check an    RF, ESR, ANA, CBC, and CMET.  At the present time I will treat this as    osteoarthritis.  I have discussed that NSAIDs are frequently used but we    will not use these because of the significant problems with her stomach.    Placed her on one Darvocet and one 500 mg Tylenol together b.i.d.-t.i.d.    p.r.n.  I pointed out she should not exceed 4000 mc of acetaminophen q.d. 2. Back pain.  This seems to be a fairly significant problem for her.  I will    have her go for physical therapy to learn range of motion and stretching to    try and help with this.  Also, walking and water aerobics could help.  I    think she has access to a jacuzzi and if it allows enough room, then    kicking in the water can help. 3. History of carpal tunnel surgery. 4. Hysterectomy.  Loraine Leriche, I believe Patricia Hodges has OA.  I am not quite certain what happened about a month ago but she may  have overexerted herself with all her tasks.  She seems to be some better but she clearly has the swelling to the PIPs and DIPs.  I have discussed that possible injections to the left second and third PIPs might be reasonable.  She has had this done in the past.  At the present time we will not do this.  I will see her back in about six weeks. Dictated by:   Aundra Dubin, M.D. Attending Physician:  Aundra Dubin DD:  12/26/00  TD:  12/26/00 Job: [id number]

## 2010-08-04 NOTE — Op Note (Signed)
NAME:  Patricia Hodges, Patricia Hodges          ACCOUNT NO.:  [id number]   MEDICAL RECORD NO.:  [id number]          PATIENT TYPE:  AMB   LOCATION:  DAY                           FACILITY:  [facility]   PHYSICIAN:  R. Roetta Sessions, M.D. DATE OF BIRTH:  [birth year]   DATE OF PROCEDURE:  09/06/2004  DATE OF DISCHARGE:                                 OPERATIVE REPORT   PROCEDURE:  Screening colonoscopy.   INDICATIONS FOR PROCEDURE:  The patient is a 65 year old lady sent over for  colorectal cancer screening. She is devoid of any lower GI tract symptoms.  There is no family history of colorectal neoplasia. She has never had a  colonoscopy. Colonoscopy is now being done as a screening maneuver. This  approach has been discussed with patient at length. Potential risks,  benefits, and alternatives have been reviewed and questions answered. She is  agreeable. Please see the documentation in the medical record.   PROCEDURE NOTE:  O2 saturation, blood pressure, pulse, and respirations were  monitored throughout the entire procedure. Conscious sedation with Versed 5  mg IV and Demerol 75 mg IV in divided doses.   INSTRUMENT:  Olympus video chip system.   FINDINGS:  Digital rectal exam revealed no abnormalities.   ENDOSCOPIC FINDINGS:  Prep was good.   Rectum:  Examination of the rectal mucosa revealed normal mucosa. Rectal  vault was small and was unable to retroflex although for the same reason I  was able to see the rectal mucosa very well, and it appeared normal.   Colon:  Colonic mucosa was surveyed from the rectosigmoid junction through  the left, transverse, and right colon to the area of the appendiceal  orifice, ileocecal valve, and cecum. These structures were well seen and  photographed for the record. From this level, the scope was slowly  withdrawn, and all previously mentioned mucosal surfaces were again seen.  The patient had a few scattered pancolonic diverticula all the way to the  cecum.  Remainder of colonic mucosa appeared normal. The patient tolerated  the procedure well and was reactive to endoscopy.   IMPRESSION:  Normal rectum with few scattered pancolonic diverticula.  Remainder of colonic mucosa appeared normal.   RECOMMENDATIONS:  1.  Diverticulosis literature provided to Ms. Veale.  2.  Repeat colonoscopy in 10 years.       RMR/MEDQ  D:  09/06/2004  T:  09/06/2004  Job:  [id number]   cc:   Patrica Duel, M.D.  [address]  [place]  [place] [address]  Fax: [phone]

## 2010-10-19 ENCOUNTER — Encounter: Payer: Self-pay | Admitting: Internal Medicine

## 2010-11-23 ENCOUNTER — Encounter: Payer: Self-pay | Admitting: Gastroenterology

## 2010-11-23 ENCOUNTER — Ambulatory Visit (INDEPENDENT_AMBULATORY_CARE_PROVIDER_SITE_OTHER): Payer: Medicare Other | Admitting: Gastroenterology

## 2010-11-23 DIAGNOSIS — K219 Gastro-esophageal reflux disease without esophagitis: Secondary | ICD-10-CM

## 2010-11-23 NOTE — Assessment & Plan Note (Signed)
65 year old Caucasian female with history of GERD, doing quite well today without any dysphagia, nausea or vomiting, or abdominal pain. She does notice a difference when she misses a dose of Omeprazole. She favors Nexium over omeprazole, but her insurance does not cover this.  Continue with omeprazole daily. Nexium samples provided to use in place of omeprazole until more samples obtained. [employer] does not cover). Colonoscopy in 2016 Follow-up in 1 year with Dr. Jena Gauss. Pt wants to be seen by him only; however, she did state she would be agreeable to seeing myself if he was unable at that time.

## 2010-11-23 NOTE — Progress Notes (Signed)
Primary Care Physician:  Kirk Ruths, MD Primary Gastroenterologist: Dr. Jena Gauss   Chief Complaint  Patient presents with  . Follow-up    HPI:   Ms. Ware is a pleasant 65 year old female who presents in follow-up today for GERD. There was some confusion initially regarding who she was seeing today; she thought she was only seeing Dr. Jena Gauss. I explained who I was, and she stated she would rather reschedule. I told her this was absolutely fine and offered some samples until she could be seen again.  When I returned to the room, she stated she would be ok with seeing me today since she was already here. She was not upset. She is taking Omeprazole daily and Nexium when she can obtain samples. The Nexium does the best for her, but omeprazole is doing a fair job as well. If she misses a dose, she has breakthrough reflux. Otherwise, she denies dysphagia, nausea or vomiting, loss of appetite, or abdominal pain. She has no lower GI symptoms currently.  Her next colonoscopy will be due in 2016.   Past Medical History  Diagnosis Date  . Gastroenteritis, eosinophilic   . H pylori ulcer   . Esophageal reflux   . Dysphagia   . Osteoarthritis   . Diverticulosis   . S/P endoscopy 2007    Schatzki's Ring  . S/P colonoscopy 2006    diverticulosis, due for repeat in 2016    Past Surgical History  Procedure Date  . Hx schatski's ring   . Cesarean section   . Cholecystectomy   . Bladder tack   . Feet surgery   . Hand surgery   . S/p hysterectomy   . Colonoscopy 2006    Current Outpatient Prescriptions  Medication Sig Dispense Refill  . Ascorbic Acid (VITAMIN C) 1000 MG tablet Take 1,000 mg by mouth daily.        . calcium carbonate (OS-CAL) 600 MG TABS Take 600 mg by mouth 2 (two) times daily with a meal.        . gemfibrozil (LOPID) 600 MG tablet Take 600 mg by mouth 2 (two) times daily before a meal.        . ibuprofen (ADVIL,MOTRIN) 200 MG tablet Take 200 mg by mouth every 6 (six)  hours as needed.        Marland Kitchen omeprazole (PRILOSEC) 20 MG capsule Take 20 mg by mouth daily.        Marland Kitchen VITAMIN D, CHOLECALCIFEROL, PO Take 5,000 Units by mouth daily.        Marland Kitchen zinc gluconate 50 MG tablet Take 50 mg by mouth daily.        Marland Kitchen HYDROcodone-acetaminophen (VICODIN) 5-500 MG per tablet Take 1 tablet by mouth every 6 (six) hours as needed.          Allergies as of 11/23/2010 - Review Complete 11/23/2010  Allergen Reaction Noted  . Alendronate sodium    . Aspirin    . Celecoxib    . Ezetimibe-simvastatin    . Rofecoxib        History   Social History  . Marital Status: Married    Spouse Name: N/A    Number of Children: N/A  . Years of Education: N/A   Social History Main Topics  . Smoking status: Former Smoker -- 1.0 packs/day    Types: Cigarettes  . Smokeless tobacco: None   Comment: quit about 30 yrs ago  . Alcohol Use: No  . Drug Use: No  . Sexually  Active: None    Review of Systems: Gen: Denies fever, chills, anorexia. Denies fatigue, weakness, weight loss.  CV: Denies chest pain, palpitations, syncope, peripheral edema, and claudication. Resp: Denies dyspnea at rest, cough, wheezing, coughing up blood, and pleurisy. GI: Denies vomiting blood, jaundice, and fecal incontinence.   Denies dysphagia or odynophagia. Derm: Denies rash, itching, dry skin Psych: Denies depression, anxiety, memory loss, confusion. No homicidal or suicidal ideation.  Heme: Denies bruising, bleeding, and enlarged lymph nodes.  Physical Exam: BP 123/79  Pulse 58  Temp(Src) 97 F (36.1 C) (Temporal)  Ht 5\' 4"  (1.626 m)  Wt 161 lb 6.4 oz (73.211 kg)  BMI 27.70 kg/m2 General:   Alert and oriented. No distress noted. Pleasant and cooperative.  Head:  Normocephalic and atraumatic. Eyes:  Conjuctiva clear without scleral icterus. Mouth:  Oral mucosa pink and moist. Good dentition. No lesions. Neck:  Supple, without mass or thyromegaly. Heart:  S1, S2 present without murmurs, rubs, or  gallops. Regular rate and rhythm. Abdomen:  +BS, soft, non-tender and non-distended. No rebound or guarding. No HSM or masses noted. Msk:  Symmetrical without gross deformities. Normal posture. Extremities:  Without edema. Neurologic:  Alert and  oriented x4;  grossly normal neurologically. Skin:  Intact without significant lesions or rashes. Cervical Nodes:  No significant cervical adenopathy. Psych:  Alert and cooperative. Normal mood and affect.

## 2010-11-23 NOTE — Progress Notes (Signed)
Cc to PCP 

## 2010-12-15 LAB — CBC
HCT: 39.4
Hemoglobin: 13.4
MCHC: 33.9
MCV: 90
Platelets: 210
RBC: 4.38
RDW: 13.3
WBC: 7.3

## 2010-12-15 LAB — BASIC METABOLIC PANEL
BUN: 11
CO2: 31
Calcium: 8.6
Chloride: 107
Creatinine, Ser: 0.7
GFR calc Af Amer: >60
GFR calc non Af Amer: >60
Glucose, Bld: 106 — ABNORMAL HIGH
Potassium: 3.7
Sodium: 140

## 2010-12-15 LAB — POCT CARDIAC MARKERS
CKMB, poc: 2.1
Myoglobin, poc: 64.6
Operator id: 205141
Troponin i, poc: <0.05

## 2010-12-15 LAB — DIFFERENTIAL
Basophils Absolute: 0
Basophils Relative: 1
Eosinophils Absolute: 0.6
Eosinophils Relative: 8 — ABNORMAL HIGH
Lymphocytes Relative: 28
Lymphs Abs: 2
Monocytes Absolute: 0.4
Monocytes Relative: 6
Neutro Abs: 4.3
Neutrophils Relative %: 58

## 2011-01-08 NOTE — Progress Notes (Signed)
Pt called for samples. I left to boxes of Prilosec up front for her.

## 2011-01-19 ENCOUNTER — Other Ambulatory Visit (HOSPITAL_COMMUNITY): Payer: Self-pay | Admitting: Family Medicine

## 2011-01-19 DIAGNOSIS — Z139 Encounter for screening, unspecified: Secondary | ICD-10-CM

## 2011-01-24 ENCOUNTER — Ambulatory Visit (HOSPITAL_COMMUNITY)
Admission: RE | Admit: 2011-01-24 | Discharge: 2011-01-24 | Disposition: A | Discharge status: Home / Self Care | Payer: Medicare Other | Source: Ambulatory Visit | Attending: Family Medicine | Admitting: Family Medicine

## 2011-01-24 DIAGNOSIS — Z780 Asymptomatic menopausal state: Secondary | ICD-10-CM | POA: Insufficient documentation

## 2011-01-24 DIAGNOSIS — Z139 Encounter for screening, unspecified: Secondary | ICD-10-CM

## 2011-01-24 DIAGNOSIS — Z13820 Encounter for screening for osteoporosis: Secondary | ICD-10-CM | POA: Insufficient documentation

## 2011-01-24 DIAGNOSIS — M818 Other osteoporosis without current pathological fracture: Secondary | ICD-10-CM | POA: Insufficient documentation

## 2011-02-06 DIAGNOSIS — C4492 Squamous cell carcinoma of skin, unspecified: Secondary | ICD-10-CM

## 2011-02-06 HISTORY — DX: Squamous cell carcinoma of skin, unspecified: C44.92

## 2011-03-26 ENCOUNTER — Ambulatory Visit (HOSPITAL_COMMUNITY)
Admission: RE | Admit: 2011-03-26 | Discharge: 2011-03-26 | Disposition: A | Discharge status: Home / Self Care | Payer: Medicare Other | Source: Ambulatory Visit | Attending: Family Medicine | Admitting: Family Medicine

## 2011-03-26 ENCOUNTER — Ambulatory Visit (HOSPITAL_COMMUNITY): Payer: Medicare Other

## 2011-03-26 DIAGNOSIS — Z139 Encounter for screening, unspecified: Secondary | ICD-10-CM

## 2011-03-26 DIAGNOSIS — Z1231 Encounter for screening mammogram for malignant neoplasm of breast: Secondary | ICD-10-CM | POA: Insufficient documentation

## 2011-11-29 ENCOUNTER — Other Ambulatory Visit: Payer: Self-pay | Admitting: Dermatology

## 2011-12-04 ENCOUNTER — Encounter: Payer: Self-pay | Admitting: Internal Medicine

## 2012-04-25 ENCOUNTER — Other Ambulatory Visit (HOSPITAL_COMMUNITY): Payer: Self-pay | Admitting: Family Medicine

## 2012-04-25 DIAGNOSIS — Z139 Encounter for screening, unspecified: Secondary | ICD-10-CM

## 2012-04-29 ENCOUNTER — Ambulatory Visit (HOSPITAL_COMMUNITY): Payer: Medicare Other

## 2012-05-08 ENCOUNTER — Ambulatory Visit (HOSPITAL_COMMUNITY): Payer: Medicare Other

## 2012-08-05 ENCOUNTER — Ambulatory Visit (HOSPITAL_COMMUNITY)
Admission: RE | Admit: 2012-08-05 | Discharge: 2012-08-05 | Disposition: A | Discharge status: Home / Self Care | Payer: Medicare Other | Source: Ambulatory Visit | Attending: Family Medicine | Admitting: Family Medicine

## 2012-08-05 DIAGNOSIS — Z1231 Encounter for screening mammogram for malignant neoplasm of breast: Secondary | ICD-10-CM | POA: Insufficient documentation

## 2012-08-05 DIAGNOSIS — Z139 Encounter for screening, unspecified: Secondary | ICD-10-CM

## 2013-02-18 DIAGNOSIS — R002 Palpitations: Secondary | ICD-10-CM | POA: Insufficient documentation

## 2013-10-01 ENCOUNTER — Other Ambulatory Visit (HOSPITAL_COMMUNITY): Payer: Self-pay | Admitting: Family Medicine

## 2013-10-01 ENCOUNTER — Ambulatory Visit (HOSPITAL_COMMUNITY)
Admission: RE | Admit: 2013-10-01 | Discharge: 2013-10-01 | Disposition: A | Discharge status: Home / Self Care | Payer: Medicare HMO | Source: Ambulatory Visit | Attending: Family Medicine | Admitting: Family Medicine

## 2013-10-01 DIAGNOSIS — M546 Pain in thoracic spine: Secondary | ICD-10-CM

## 2013-10-01 DIAGNOSIS — M549 Dorsalgia, unspecified: Secondary | ICD-10-CM | POA: Insufficient documentation

## 2013-12-10 ENCOUNTER — Other Ambulatory Visit (HOSPITAL_COMMUNITY): Payer: Self-pay | Admitting: Family Medicine

## 2013-12-10 DIAGNOSIS — M810 Age-related osteoporosis without current pathological fracture: Secondary | ICD-10-CM

## 2013-12-10 DIAGNOSIS — Z139 Encounter for screening, unspecified: Secondary | ICD-10-CM

## 2013-12-16 ENCOUNTER — Ambulatory Visit (HOSPITAL_COMMUNITY)
Admission: RE | Admit: 2013-12-16 | Discharge: 2013-12-16 | Disposition: A | Discharge status: Home / Self Care | Payer: Medicare HMO | Source: Ambulatory Visit | Attending: Family Medicine | Admitting: Family Medicine

## 2013-12-16 DIAGNOSIS — Z139 Encounter for screening, unspecified: Secondary | ICD-10-CM

## 2013-12-16 DIAGNOSIS — M810 Age-related osteoporosis without current pathological fracture: Secondary | ICD-10-CM | POA: Insufficient documentation

## 2013-12-16 DIAGNOSIS — Z1231 Encounter for screening mammogram for malignant neoplasm of breast: Secondary | ICD-10-CM | POA: Diagnosis present

## 2013-12-21 ENCOUNTER — Other Ambulatory Visit: Payer: Self-pay | Admitting: Ophthalmology

## 2014-04-26 ENCOUNTER — Other Ambulatory Visit: Payer: Self-pay

## 2014-05-05 ENCOUNTER — Other Ambulatory Visit (HOSPITAL_COMMUNITY): Payer: Self-pay | Admitting: Family Medicine

## 2014-05-05 ENCOUNTER — Ambulatory Visit (HOSPITAL_COMMUNITY)
Admission: RE | Admit: 2014-05-05 | Discharge: 2014-05-05 | Disposition: A | Discharge status: Home / Self Care | Payer: Medicare HMO | Source: Ambulatory Visit | Attending: Family Medicine | Admitting: Family Medicine

## 2014-05-05 DIAGNOSIS — M542 Cervicalgia: Secondary | ICD-10-CM

## 2014-05-05 DIAGNOSIS — R51 Headache: Secondary | ICD-10-CM | POA: Diagnosis not present

## 2014-08-02 ENCOUNTER — Telehealth: Payer: Self-pay | Admitting: Internal Medicine

## 2014-08-02 NOTE — Telephone Encounter (Signed)
Letter mailed for pt to call.  

## 2014-08-02 NOTE — Telephone Encounter (Signed)
June RECALL FOR TCS

## 2014-08-04 ENCOUNTER — Telehealth: Payer: Self-pay

## 2014-08-04 NOTE — Telephone Encounter (Signed)
PATIENT CALLED TO SCHEDULE A COLONOSCOPY  [phone]

## 2014-08-06 ENCOUNTER — Telehealth: Payer: Self-pay

## 2014-08-06 ENCOUNTER — Other Ambulatory Visit: Payer: Self-pay

## 2014-08-06 DIAGNOSIS — Z1211 Encounter for screening for malignant neoplasm of colon: Secondary | ICD-10-CM

## 2014-08-09 NOTE — Telephone Encounter (Signed)
See separate triage.  

## 2014-08-09 NOTE — Telephone Encounter (Signed)
Gastroenterology Pre-Procedure Review  Request Date: 08/06/2014 Requesting Physician: ON 10 YEAR RECALL  PT IS REQUESTING PILLS, SAID SHE HAD THEM THE LAST TIME  PATIENT REVIEW QUESTIONS: The patient responded to the following health history questions as indicated:    1. Diabetes Melitis: no 2. Joint replacements in the past 12 months: no 3. Major health problems in the past 3 months: no 4. Has an artificial valve or MVP: no 5. Has a defibrillator: no 6. Has been advised in past to take antibiotics in advance of a procedure like teeth cleaning: no    MEDICATIONS & ALLERGIES:    Patient reports the following regarding taking any blood thinners:   Plavix? no Aspirin? no Coumadin? no  Patient confirms/reports the following medications:  Current Outpatient Prescriptions  Medication Sig Dispense Refill  . FLUoxetine (PROZAC) 10 MG tablet Take 10 mg by mouth daily.    Marland Kitchen HYDROcodone-acetaminophen (VICODIN) 5-500 MG per tablet Take 1 tablet by mouth every 6 (six) hours as needed.      Marland Kitchen ibuprofen (ADVIL,MOTRIN) 200 MG tablet Take 200 mg by mouth every 6 (six) hours as needed.      Marland Kitchen omeprazole (PRILOSEC) 20 MG capsule Take 20 mg by mouth daily.      . Ascorbic Acid (VITAMIN C) 1000 MG tablet Take 1,000 mg by mouth daily.      . calcium carbonate (OS-CAL) 600 MG TABS Take 600 mg by mouth 2 (two) times daily with a meal.      . gemfibrozil (LOPID) 600 MG tablet Take 600 mg by mouth 2 (two) times daily before a meal.      . VITAMIN D, CHOLECALCIFEROL, PO Take 5,000 Units by mouth daily.      Marland Kitchen zinc gluconate 50 MG tablet Take 50 mg by mouth daily.       No current facility-administered medications for this visit.    Patient confirms/reports the following allergies:  Allergies  Allergen Reactions  . Alendronate Sodium     REACTION: Unknown reaction  . Aspirin     REACTION: Unknown reaction  . Celecoxib     REACTION: Unknown reaction  . Ezetimibe-Simvastatin     REACTION: Unknown  reaction  . Rofecoxib     REACTION: Unknown reaction    No orders of the defined types were placed in this encounter.    AUTHORIZATION INFORMATION Primary Insurance:  ID #:   Group #:  Pre-Cert / Auth required:  Pre-Cert / Auth #:   Secondary Insurance:  ID #:  Group #:  Pre-Cert / Auth required:  Pre-Cert / Auth #:   SCHEDULE INFORMATION: Procedure has been scheduled as follows:  Date:   09/08/2014                Time:  10:30 am Location: [facility] Short Stay  This Gastroenterology Pre-Precedure Review Form is being routed to the following provider(s): R. Garfield Cornea, MD

## 2014-08-10 NOTE — Telephone Encounter (Signed)
Please find out if the patient is having daily bowel movements.  I will see if Dr. Gala Romney will agree to pill prep. If we do pill prep, I will need documentation of normal creatinine within past six months or so from PCP.   Better option may be Prepopik (10 ounces of liquid and then lots of water).

## 2014-08-10 NOTE — Telephone Encounter (Signed)
Pt said she sometimes has a little problem with constipation, but not often. For the most part she has a BM daily. She does not want the pill if she will have to have labs, she will go with the Lopeno. IF it is too expensive, she will try the Movie Prep and if it is too expensive she will have to take the cheaper.

## 2014-08-12 NOTE — Telephone Encounter (Signed)
Recommend Moviprep (Split dose) if insurance will pay.

## 2014-08-13 MED ORDER — PEG-KCL-NACL-NASULF-NA ASC-C 100 G PO SOLR: 1.0000 | ORAL | Status: DC

## 2014-08-17 NOTE — Telephone Encounter (Signed)
Rx was sent to the pharmacy and instructions mailed to pt.  

## 2014-08-30 ENCOUNTER — Telehealth: Payer: Self-pay

## 2014-08-30 NOTE — Telephone Encounter (Signed)
I called pt and LMOM for a return call to update med list prior to colonoscopy on 09/08/2014.

## 2014-08-31 ENCOUNTER — Telehealth: Payer: Self-pay

## 2014-08-31 NOTE — Telephone Encounter (Signed)
I called [employer] @ [phone] and spoke to [place] who said a PA is not required for screening colonoscopy.

## 2014-09-06 ENCOUNTER — Ambulatory Visit (HOSPITAL_COMMUNITY)
Admission: RE | Admit: 2014-09-06 | Discharge: 2014-09-06 | Disposition: A | Discharge status: Home / Self Care | Payer: Medicare HMO | Source: Ambulatory Visit | Attending: Family Medicine | Admitting: Family Medicine

## 2014-09-06 ENCOUNTER — Other Ambulatory Visit (HOSPITAL_COMMUNITY): Payer: Self-pay | Admitting: Family Medicine

## 2014-09-06 DIAGNOSIS — M8580 Other specified disorders of bone density and structure, unspecified site: Secondary | ICD-10-CM | POA: Diagnosis not present

## 2014-09-06 DIAGNOSIS — R0781 Pleurodynia: Secondary | ICD-10-CM | POA: Diagnosis present

## 2014-09-07 NOTE — Telephone Encounter (Signed)
PT said there has not been any change in her medications.

## 2014-09-08 ENCOUNTER — Encounter (HOSPITAL_COMMUNITY): Payer: Self-pay | Admitting: *Deleted

## 2014-09-08 ENCOUNTER — Encounter (HOSPITAL_COMMUNITY)
Admission: RE | Disposition: A | Discharge status: Home / Self Care | Payer: Self-pay | Source: Ambulatory Visit | Attending: Internal Medicine

## 2014-09-08 ENCOUNTER — Ambulatory Visit (HOSPITAL_COMMUNITY)
Admission: RE | Admit: 2014-09-08 | Discharge: 2014-09-08 | Disposition: A | Discharge status: Home / Self Care | Payer: Medicare HMO | Source: Ambulatory Visit | Attending: Internal Medicine | Admitting: Internal Medicine

## 2014-09-08 DIAGNOSIS — Z87891 Personal history of nicotine dependence: Secondary | ICD-10-CM | POA: Insufficient documentation

## 2014-09-08 DIAGNOSIS — K5730 Diverticulosis of large intestine without perforation or abscess without bleeding: Secondary | ICD-10-CM | POA: Diagnosis not present

## 2014-09-08 DIAGNOSIS — Z791 Long term (current) use of non-steroidal anti-inflammatories (NSAID): Secondary | ICD-10-CM | POA: Insufficient documentation

## 2014-09-08 DIAGNOSIS — Z79891 Long term (current) use of opiate analgesic: Secondary | ICD-10-CM | POA: Insufficient documentation

## 2014-09-08 DIAGNOSIS — Z860100 Personal history of colon polyps, unspecified: Secondary | ICD-10-CM | POA: Insufficient documentation

## 2014-09-08 DIAGNOSIS — D124 Benign neoplasm of descending colon: Secondary | ICD-10-CM

## 2014-09-08 DIAGNOSIS — F0390 Unspecified dementia without behavioral disturbance: Secondary | ICD-10-CM | POA: Insufficient documentation

## 2014-09-08 DIAGNOSIS — M1990 Unspecified osteoarthritis, unspecified site: Secondary | ICD-10-CM | POA: Diagnosis not present

## 2014-09-08 DIAGNOSIS — Z9049 Acquired absence of other specified parts of digestive tract: Secondary | ICD-10-CM | POA: Insufficient documentation

## 2014-09-08 DIAGNOSIS — K219 Gastro-esophageal reflux disease without esophagitis: Secondary | ICD-10-CM | POA: Insufficient documentation

## 2014-09-08 DIAGNOSIS — Z79899 Other long term (current) drug therapy: Secondary | ICD-10-CM | POA: Insufficient documentation

## 2014-09-08 DIAGNOSIS — D120 Benign neoplasm of cecum: Secondary | ICD-10-CM

## 2014-09-08 DIAGNOSIS — Z1211 Encounter for screening for malignant neoplasm of colon: Secondary | ICD-10-CM | POA: Insufficient documentation

## 2014-09-08 DIAGNOSIS — D122 Benign neoplasm of ascending colon: Secondary | ICD-10-CM | POA: Diagnosis not present

## 2014-09-08 HISTORY — DX: Pure hypercholesterolemia, unspecified: E78.00

## 2014-09-08 HISTORY — PX: COLONOSCOPY: SHX5424

## 2014-09-08 SURGERY — COLONOSCOPY
Anesthesia: Moderate Sedation

## 2014-09-08 MED ORDER — STERILE WATER FOR IRRIGATION IR SOLN
Status: DC | PRN
  Administered 2014-09-08: 11:00:00

## 2014-09-08 MED ORDER — ONDANSETRON HCL 4 MG/2ML IJ SOLN
INTRAMUSCULAR | Status: AC
  Filled 2014-09-08: qty 2

## 2014-09-08 MED ORDER — MEPERIDINE HCL 100 MG/ML IJ SOLN
INTRAMUSCULAR | Status: DC | PRN
  Administered 2014-09-08 (×2): 25 mg via INTRAVENOUS
  Administered 2014-09-08: 50 mg via INTRAVENOUS

## 2014-09-08 MED ORDER — SODIUM CHLORIDE 0.9 % IV SOLN
INTRAVENOUS | Status: DC
  Administered 2014-09-08: 10:00:00 via INTRAVENOUS

## 2014-09-08 MED ORDER — MEPERIDINE HCL 100 MG/ML IJ SOLN
INTRAMUSCULAR | Status: AC
  Filled 2014-09-08: qty 2

## 2014-09-08 MED ORDER — MIDAZOLAM HCL 5 MG/5ML IJ SOLN
INTRAMUSCULAR | Status: AC
  Filled 2014-09-08: qty 10

## 2014-09-08 MED ORDER — MIDAZOLAM HCL 5 MG/5ML IJ SOLN
INTRAMUSCULAR | Status: DC | PRN
Start: 2014-09-08 — End: 2014-09-08
  Administered 2014-09-08: 2 mg via INTRAVENOUS
  Administered 2014-09-08: 1 mg via INTRAVENOUS
  Administered 2014-09-08: 2 mg via INTRAVENOUS
  Administered 2014-09-08: 1 mg via INTRAVENOUS

## 2014-09-08 MED ORDER — ONDANSETRON HCL 4 MG/2ML IJ SOLN
INTRAMUSCULAR | Status: DC | PRN
  Administered 2014-09-08: 4 mg via INTRAVENOUS

## 2014-09-08 NOTE — Telephone Encounter (Signed)
Noted and agree. 

## 2014-09-08 NOTE — Op Note (Signed)
[facility] [address] [place], [address]   COLONOSCOPY PROCEDURE REPORT  PATIENT: Patricia Hodges, Patricia Hodges  MR#: [id number] BIRTHDATE: Feb 28, 1946 , 69  yrs. old GENDER: female ENDOSCOPIST: R.  Garfield Cornea, MD FACP [facility] REFERRED MK:LKJZPH Ethlyn Gallery, M.D. PROCEDURE DATE:  2014/10/02 PROCEDURE:   Colonoscopy with biopsy and snare polypectomy INDICATIONS:Average risk colorectal cancer screening examination. MEDICATIONS: Versed 6 mg IV and Demerol 100 mg IV in divided doses. Zofran 4 mg IV. ASA CLASS:       Class II  CONSENT: The risks, benefits, alternatives and imponderables including but not limited to bleeding, perforation as well as the possibility of a missed lesion have been reviewed.  The potential for biopsy, lesion removal, etc. have also been discussed. Questions have been answered.  All parties agreeable.  Please see the history and physical in the medical record for more information.  DESCRIPTION OF PROCEDURE:   After the risks benefits and alternatives of the procedure were thoroughly explained, informed consent was obtained.  The digital rectal exam revealed no abnormalities of the rectum.   The EC-[id number] ([id number])  endoscope was introduced through the anus and advanced to the cecum, which was identified by both the appendix and ileocecal valve. No adverse events experienced.   The quality of the prep was adequate  The instrument was then slowly withdrawn as the colon was fully examined. Estimated blood loss is zero unless otherwise noted in this procedure report.      COLON FINDINGS: Single ascending colon diverticulum.  (1) 5 mm polyp in the mid descending segment; there was another 5 mm polyp in the cecum and (1) 3 mm polyp in the ascending segment.  The remainder of the colonic mucosa appeared normal  The descending and cecal polyp were removed with cold snare technique.  The ascending colon polyp was removed with cold biopsy forceps  technique.  Retroflexion was performed. .  Withdrawal time=12 minutes 0 seconds.  The scope was withdrawn and the procedure completed. COMPLICATIONS: There were no immediate complications.  ENDOSCOPIC IMPRESSION: Multiple colonic polyps?"removed as described above. Single right-sided colon diverticulum  RECOMMENDATIONS: Follow-up on pathology. Further recommendations to follow.  eSigned:  R. Garfield Cornea, MD Rosalita Chessman [facility] 10-02-2014 11:24 AM   cc:  CPT CODES: ICD CODES:  The ICD and CPT codes recommended by this software are interpretations from the data that the clinical staff has captured with the software.  The verification of the translation of this report to the ICD and CPT codes and modifiers is the sole responsibility of the health care institution and practicing physician where this report was generated.  [place]. will not be held responsible for the validity of the ICD and CPT codes included on this report.  AMA assumes no liability for data contained or not contained herein. CPT is a Designer, television/film set of the [employer].  PATIENT NAME:  Patricia Hodges, Patricia Hodges MR#: [id number]

## 2014-09-08 NOTE — H&P (Signed)
$'@LOGO'A$ @   Primary Care Physician:  Rocky Morel, MD Primary Gastroenterologist:  Dr. Gala Romney  Pre-Procedure History & Physical: HPI:  Patricia Hodges is a 69 y.o. female is here for a screening colonoscopy. Negative examination 10 years ago. No bowel symptoms. No family history of colon cancer.  Past Medical History  Diagnosis Date  . Gastroenteritis, eosinophilic   . H pylori ulcer   . Esophageal reflux   . Dysphagia   . Osteoarthritis   . Diverticulosis   . S/P endoscopy 2007    Schatzki's Ring  . S/P colonoscopy 2006    diverticulosis, due for repeat in 2016  . Dementia   . High cholesterol     Past Surgical History  Procedure Laterality Date  . Hx schatski's ring    . Cesarean section    . Cholecystectomy    . Bladder tack    . Feet surgery    . S/p hysterectomy    . Colonoscopy  2006  . Carpal tunnel release Left   . Hand surgery Right     on ring finger due to swollen knuckle  . Bunionectomy Right     Prior to Admission medications   Medication Sig Start Date End Date Taking? Authorizing Provider  FLUoxetine (PROZAC) 10 MG tablet Take 10 mg by mouth daily.   Yes Historical Provider, MD  HYDROcodone-acetaminophen (VICODIN) 5-500 MG per tablet Take 1 tablet by mouth every 6 (six) hours as needed.     Yes Historical Provider, MD  ibuprofen (ADVIL,MOTRIN) 200 MG tablet Take 200 mg by mouth every 6 (six) hours as needed.     Yes Historical Provider, MD  omeprazole (PRILOSEC) 20 MG capsule Take 20 mg by mouth daily.     Yes Historical Provider, MD  peg 3350 powder (MOVIPREP) 100 G SOLR Take 1 kit (200 g total) by mouth as directed. 08/13/14  Yes Daneil Dolin, MD    Allergies as of 08/06/2014 - Review Complete 08/06/2014  Allergen Reaction Noted  . Alendronate sodium    . Aspirin    . Celecoxib    . Ezetimibe-simvastatin    . Rofecoxib      Family History  Problem Relation Age of Onset  . Colon cancer Neg Hx     History   Social History   . Marital Status: Married    Spouse Name: N/A  . Number of Children: N/A  . Years of Education: N/A   Occupational History  . Not on file.   Social History Main Topics  . Smoking status: Former Smoker -- 1.00 packs/day for 20 years    Types: Cigarettes  . Smokeless tobacco: Not on file     Comment: quit about 30 yrs ago  . Alcohol Use: No  . Drug Use: No  . Sexual Activity: Not on file   Other Topics Concern  . Not on file   Social History Narrative    Review of Systems: See HPI, otherwise negative ROS  Physical Exam: BP 115/47 mmHg  Pulse 60  Temp(Src) 97.8 F (36.6 C) (Oral)  Resp 23  Ht 5' 4.5" (1.638 m)  Wt 175 lb (79.379 kg)  BMI 29.59 kg/m2  SpO2 95% General:   Alert,  Well-developed, well-nourished, pleasant and cooperative in NAD Head:  Normocephalic and atraumatic. Eyes:  Sclera clear, no icterus.   Conjunctiva pink. Ears:  Normal auditory acuity. Nose:  No deformity, discharge,  or lesions. Mouth:  No deformity or lesions, dentition normal. Neck:  Supple; no masses or thyromegaly. Lungs:  Clear throughout to auscultation.   No wheezes, crackles, or rhonchi. No acute distress. Heart:  Regular rate and rhythm; no murmurs, clicks, rubs,  or gallops. Abdomen:  Soft, nontender and nondistended. No masses, hepatosplenomegaly or hernias noted. Normal bowel sounds, without guarding, and without rebound.   Msk:  Symmetrical without gross deformities. Normal posture. Pulses:  Normal pulses noted. Extremities:  Without clubbing or edema. Neurologic:  Alert and  oriented x4;  grossly normal neurologically. Skin:  Intact without significant lesions or rashes. Cervical Nodes:  No significant cervical adenopathy. Psych:  Alert and cooperative. Normal mood and affect.  Impression/Plan: Patricia Hodges is now here to undergo a screening colonoscopy.  Average risk screening examination. Risks, benefits, limitations, imponderables and alternatives regarding  colonoscopy have been reviewed with the patient. Questions have been answered. All parties agreeable.     Notice:  This dictation was prepared with Dragon dictation along with smaller phrase technology. Any transcriptional errors that result from this process are unintentional and may not be corrected upon review.

## 2014-09-08 NOTE — Discharge Instructions (Signed)
Colonoscopy Discharge Instructions  Read the instructions outlined below and refer to this sheet in the next few weeks. These discharge instructions provide you with general information on caring for yourself after you leave the hospital. Your doctor may also give you specific instructions. While your treatment has been planned according to the most current medical practices available, unavoidable complications occasionally occur. If you have any problems or questions after discharge, call Dr. Gala Romney at [phone]. ACTIVITY  You may resume your regular activity, but move at a slower pace for the next 24 hours.   Take frequent rest periods for the next 24 hours.   Walking will help get rid of the air and reduce the bloated feeling in your belly (abdomen).   No driving for 24 hours (because of the medicine (anesthesia) used during the test).    Do not sign any important legal documents or operate any machinery for 24 hours (because of the anesthesia used during the test).  NUTRITION  Drink plenty of fluids.   You may resume your normal diet as instructed by your doctor.   Begin with a light meal and progress to your normal diet. Heavy or fried foods are harder to digest and may make you feel sick to your stomach (nauseated).   Avoid alcoholic beverages for 24 hours or as instructed.  MEDICATIONS  You may resume your normal medications unless your doctor tells you otherwise.  WHAT YOU CAN EXPECT TODAY  Some feelings of bloating in the abdomen.   Passage of more gas than usual.   Spotting of blood in your stool or on the toilet paper.  IF YOU HAD POLYPS REMOVED DURING THE COLONOSCOPY:  No aspirin products for 7 days or as instructed.   No alcohol for 7 days or as instructed.   Eat a soft diet for the next 24 hours.  FINDING OUT THE RESULTS OF YOUR TEST Not all test results are available during your visit. If your test results are not back during the visit, make an appointment  with your caregiver to find out the results. Do not assume everything is normal if you have not heard from your caregiver or the medical facility. It is important for you to follow up on all of your test results.  SEEK IMMEDIATE MEDICAL ATTENTION IF:  You have more than a spotting of blood in your stool.   Your belly is swollen (abdominal distention).   You are nauseated or vomiting.   You have a temperature over 101.   You have abdominal pain or discomfort that is severe or gets worse throughout the day.    Polyp information provided Further recommendations to follow pending review of pathology report   Colonoscopy, Care After Refer to this sheet in the next few weeks. These instructions provide you with information on caring for yourself after your procedure. Your health care provider may also give you more specific instructions. Your treatment has been planned according to current medical practices, but problems sometimes occur. Call your health care provider if you have any problems or questions after your procedure. WHAT TO EXPECT AFTER THE PROCEDURE  After your procedure, it is typical to have the following:  A small amount of blood in your stool.  Moderate amounts of gas and mild abdominal cramping or bloating. HOME CARE INSTRUCTIONS  Do not drive, operate machinery, or sign important documents for 24 hours.  You may shower and resume your regular physical activities, but move at a slower pace for the  first 24 hours.  Take frequent rest periods for the first 24 hours.  Walk around or put a warm pack on your abdomen to help reduce abdominal cramping and bloating.  Drink enough fluids to keep your urine clear or pale yellow.  You may resume your normal diet as instructed by your health care provider. Avoid heavy or fried foods that are hard to digest.  Avoid drinking alcohol for 24 hours or as instructed by your health care provider.  Only take over-the-counter or  prescription medicines as directed by your health care provider.  If a tissue sample (biopsy) was taken during your procedure:  Do not take aspirin or blood thinners for 7 days, or as instructed by your health care provider.  Do not drink alcohol for 7 days, or as instructed by your health care provider.  Eat soft foods for the first 24 hours. SEEK MEDICAL CARE IF: You have persistent spotting of blood in your stool 2-3 days after the procedure. SEEK IMMEDIATE MEDICAL CARE IF:  You have more than a small spotting of blood in your stool.  You pass large blood clots in your stool.  Your abdomen is swollen (distended).  You have nausea or vomiting.  You have a fever.  You have increasing abdominal pain that is not relieved with medicine. Document Released: 10/18/2003 Document Revised: 12/24/2012 Document Reviewed: 11/10/2012 [facility] Patient Information 2015 [place], [place]. This information is not intended to replace advice given to you by your health care provider. Make sure you discuss any questions you have with your health care provider. Colon Polyps Polyps are lumps of extra tissue growing inside the body. Polyps can grow in the large intestine (colon). Most colon polyps are noncancerous (benign). However, some colon polyps can become cancerous over time. Polyps that are larger than a pea may be harmful. To be safe, caregivers remove and test all polyps. CAUSES  Polyps form when mutations in the genes cause your cells to grow and divide even though no more tissue is needed. RISK FACTORS There are a number of risk factors that can increase your chances of getting colon polyps. They include:  Being older than 50 years.  Family history of colon polyps or colon cancer.  Long-term colon diseases, such as colitis or Crohn disease.  Being overweight.  Smoking.  Being inactive.  Drinking too much alcohol. SYMPTOMS  Most small polyps do not cause symptoms. If symptoms are  present, they may include:  Blood in the stool. The stool may look dark red or black.  Constipation or diarrhea that lasts longer than 1 week. DIAGNOSIS People often do not know they have polyps until their caregiver finds them during a regular checkup. Your caregiver can use 4 tests to check for polyps:  Digital rectal exam. The caregiver wears gloves and feels inside the rectum. This test would find polyps only in the rectum.  Barium enema. The caregiver puts a liquid called barium into your rectum before taking X-rays of your colon. Barium makes your colon look white. Polyps are dark, so they are easy to see in the X-ray pictures.  Sigmoidoscopy. A thin, flexible tube (sigmoidoscope) is placed into your rectum. The sigmoidoscope has a light and tiny camera in it. The caregiver uses the sigmoidoscope to look at the last third of your colon.  Colonoscopy. This test is like sigmoidoscopy, but the caregiver looks at the entire colon. This is the most common method for finding and removing polyps. TREATMENT  Any polyps will  be removed during a sigmoidoscopy or colonoscopy. The polyps are then tested for cancer. PREVENTION  To help lower your risk of getting more colon polyps:  Eat plenty of fruits and vegetables. Avoid eating fatty foods.  Do not smoke.  Avoid drinking alcohol.  Exercise every day.  Lose weight if recommended by your caregiver.  Eat plenty of calcium and folate. Foods that are rich in calcium include milk, cheese, and broccoli. Foods that are rich in folate include chickpeas, kidney beans, and spinach. HOME CARE INSTRUCTIONS Keep all follow-up appointments as directed by your caregiver. You may need periodic exams to check for polyps. SEEK MEDICAL CARE IF: You notice bleeding during a bowel movement. Document Released: 11/30/2003 Document Revised: 05/28/2011 Document Reviewed: 05/15/2011 [facility] Patient Information 2015 [place], [place]. This information is not  intended to replace advice given to you by your health care provider. Make sure you discuss any questions you have with your health care provider.  Colon Polyps Polyps are lumps of extra tissue growing inside the body. Polyps can grow in the large intestine (colon). Most colon polyps are noncancerous (benign). However, some colon polyps can become cancerous over time. Polyps that are larger than a pea may be harmful. To be safe, caregivers remove and test all polyps. CAUSES  Polyps form when mutations in the genes cause your cells to grow and divide even though no more tissue is needed. RISK FACTORS There are a number of risk factors that can increase your chances of getting colon polyps. They include:  Being older than 50 years.  Family history of colon polyps or colon cancer.  Long-term colon diseases, such as colitis or Crohn disease.  Being overweight.  Smoking.  Being inactive.  Drinking too much alcohol. SYMPTOMS  Most small polyps do not cause symptoms. If symptoms are present, they may include:  Blood in the stool. The stool may look dark red or black.  Constipation or diarrhea that lasts longer than 1 week. DIAGNOSIS People often do not know they have polyps until their caregiver finds them during a regular checkup. Your caregiver can use 4 tests to check for polyps:  Digital rectal exam. The caregiver wears gloves and feels inside the rectum. This test would find polyps only in the rectum.  Barium enema. The caregiver puts a liquid called barium into your rectum before taking X-rays of your colon. Barium makes your colon look white. Polyps are dark, so they are easy to see in the X-ray pictures.  Sigmoidoscopy. A thin, flexible tube (sigmoidoscope) is placed into your rectum. The sigmoidoscope has a light and tiny camera in it. The caregiver uses the sigmoidoscope to look at the last third of your colon.  Colonoscopy. This test is like sigmoidoscopy, but the caregiver  looks at the entire colon. This is the most common method for finding and removing polyps. TREATMENT  Any polyps will be removed during a sigmoidoscopy or colonoscopy. The polyps are then tested for cancer. PREVENTION  To help lower your risk of getting more colon polyps:  Eat plenty of fruits and vegetables. Avoid eating fatty foods.  Do not smoke.  Avoid drinking alcohol.  Exercise every day.  Lose weight if recommended by your caregiver.  Eat plenty of calcium and folate. Foods that are rich in calcium include milk, cheese, and broccoli. Foods that are rich in folate include chickpeas, kidney beans, and spinach. HOME CARE INSTRUCTIONS Keep all follow-up appointments as directed by your caregiver. You may need periodic exams to  check for polyps. SEEK MEDICAL CARE IF: You notice bleeding during a bowel movement. Document Released: 11/30/2003 Document Revised: 05/28/2011 Document Reviewed: 05/15/2011 [facility] Patient Information 2015 [place], [place]. This information is not intended to replace advice given to you by your health care provider. Make sure you discuss any questions you have with your health care provider.

## 2014-09-10 ENCOUNTER — Encounter: Payer: Self-pay | Admitting: Internal Medicine

## 2014-09-13 ENCOUNTER — Encounter (HOSPITAL_COMMUNITY): Payer: Self-pay | Admitting: Internal Medicine

## 2014-12-21 ENCOUNTER — Other Ambulatory Visit (HOSPITAL_COMMUNITY): Payer: Self-pay | Admitting: Family Medicine

## 2014-12-21 DIAGNOSIS — R69 Illness, unspecified: Secondary | ICD-10-CM | POA: Diagnosis not present

## 2014-12-21 DIAGNOSIS — Z1231 Encounter for screening mammogram for malignant neoplasm of breast: Secondary | ICD-10-CM

## 2014-12-23 ENCOUNTER — Ambulatory Visit (HOSPITAL_COMMUNITY)
Admission: RE | Admit: 2014-12-23 | Discharge: 2014-12-23 | Disposition: A | Discharge status: Home / Self Care | Payer: Medicare HMO | Source: Ambulatory Visit | Attending: Family Medicine | Admitting: Family Medicine

## 2014-12-23 DIAGNOSIS — R002 Palpitations: Secondary | ICD-10-CM | POA: Diagnosis not present

## 2014-12-23 DIAGNOSIS — E785 Hyperlipidemia, unspecified: Secondary | ICD-10-CM | POA: Diagnosis not present

## 2014-12-23 DIAGNOSIS — Z1389 Encounter for screening for other disorder: Secondary | ICD-10-CM | POA: Diagnosis not present

## 2014-12-23 DIAGNOSIS — Z1231 Encounter for screening mammogram for malignant neoplasm of breast: Secondary | ICD-10-CM | POA: Insufficient documentation

## 2014-12-23 DIAGNOSIS — Z6829 Body mass index (BMI) 29.0-29.9, adult: Secondary | ICD-10-CM | POA: Diagnosis not present

## 2014-12-23 DIAGNOSIS — Z0001 Encounter for general adult medical examination with abnormal findings: Secondary | ICD-10-CM | POA: Diagnosis not present

## 2014-12-23 DIAGNOSIS — R69 Illness, unspecified: Secondary | ICD-10-CM | POA: Diagnosis not present

## 2014-12-23 DIAGNOSIS — E663 Overweight: Secondary | ICD-10-CM | POA: Diagnosis not present

## 2014-12-23 DIAGNOSIS — M542 Cervicalgia: Secondary | ICD-10-CM | POA: Diagnosis not present

## 2014-12-24 ENCOUNTER — Other Ambulatory Visit: Payer: Self-pay | Admitting: Family Medicine

## 2014-12-24 DIAGNOSIS — R928 Other abnormal and inconclusive findings on diagnostic imaging of breast: Secondary | ICD-10-CM

## 2015-01-04 ENCOUNTER — Ambulatory Visit (HOSPITAL_COMMUNITY)
Admission: RE | Admit: 2015-01-04 | Discharge: 2015-01-04 | Disposition: A | Discharge status: Home / Self Care | Payer: Medicare HMO | Source: Ambulatory Visit | Attending: Family Medicine | Admitting: Family Medicine

## 2015-01-04 DIAGNOSIS — R928 Other abnormal and inconclusive findings on diagnostic imaging of breast: Secondary | ICD-10-CM | POA: Diagnosis not present

## 2015-01-05 DIAGNOSIS — R69 Illness, unspecified: Secondary | ICD-10-CM | POA: Diagnosis not present

## 2015-01-26 DIAGNOSIS — S8991XA Unspecified injury of right lower leg, initial encounter: Secondary | ICD-10-CM | POA: Diagnosis not present

## 2015-01-26 DIAGNOSIS — S8012XA Contusion of left lower leg, initial encounter: Secondary | ICD-10-CM | POA: Diagnosis not present

## 2015-02-09 DIAGNOSIS — Z23 Encounter for immunization: Secondary | ICD-10-CM | POA: Diagnosis not present

## 2015-03-24 DIAGNOSIS — R69 Illness, unspecified: Secondary | ICD-10-CM | POA: Diagnosis not present

## 2015-04-14 DIAGNOSIS — H6990 Unspecified Eustachian tube disorder, unspecified ear: Secondary | ICD-10-CM | POA: Diagnosis not present

## 2015-04-14 DIAGNOSIS — J019 Acute sinusitis, unspecified: Secondary | ICD-10-CM | POA: Diagnosis not present

## 2015-04-14 DIAGNOSIS — Z6829 Body mass index (BMI) 29.0-29.9, adult: Secondary | ICD-10-CM | POA: Diagnosis not present

## 2015-04-14 DIAGNOSIS — E663 Overweight: Secondary | ICD-10-CM | POA: Diagnosis not present

## 2015-04-14 DIAGNOSIS — Z1389 Encounter for screening for other disorder: Secondary | ICD-10-CM | POA: Diagnosis not present

## 2015-04-18 DIAGNOSIS — J019 Acute sinusitis, unspecified: Secondary | ICD-10-CM | POA: Diagnosis not present

## 2015-04-18 DIAGNOSIS — H6690 Otitis media, unspecified, unspecified ear: Secondary | ICD-10-CM | POA: Diagnosis not present

## 2015-04-18 DIAGNOSIS — Z6829 Body mass index (BMI) 29.0-29.9, adult: Secondary | ICD-10-CM | POA: Diagnosis not present

## 2015-04-19 DIAGNOSIS — D2311 Other benign neoplasm of skin of right eyelid, including canthus: Secondary | ICD-10-CM | POA: Diagnosis not present

## 2015-05-16 DIAGNOSIS — Z1389 Encounter for screening for other disorder: Secondary | ICD-10-CM | POA: Diagnosis not present

## 2015-05-16 DIAGNOSIS — Z6829 Body mass index (BMI) 29.0-29.9, adult: Secondary | ICD-10-CM | POA: Diagnosis not present

## 2015-05-16 DIAGNOSIS — J301 Allergic rhinitis due to pollen: Secondary | ICD-10-CM | POA: Diagnosis not present

## 2015-05-16 DIAGNOSIS — E663 Overweight: Secondary | ICD-10-CM | POA: Diagnosis not present

## 2015-05-16 DIAGNOSIS — J019 Acute sinusitis, unspecified: Secondary | ICD-10-CM | POA: Diagnosis not present

## 2015-05-30 DIAGNOSIS — D485 Neoplasm of uncertain behavior of skin: Secondary | ICD-10-CM | POA: Diagnosis not present

## 2015-07-12 DIAGNOSIS — R350 Frequency of micturition: Secondary | ICD-10-CM | POA: Diagnosis not present

## 2015-07-12 DIAGNOSIS — Z1389 Encounter for screening for other disorder: Secondary | ICD-10-CM | POA: Diagnosis not present

## 2015-07-12 DIAGNOSIS — G894 Chronic pain syndrome: Secondary | ICD-10-CM | POA: Diagnosis not present

## 2015-07-12 DIAGNOSIS — N760 Acute vaginitis: Secondary | ICD-10-CM | POA: Diagnosis not present

## 2015-07-12 DIAGNOSIS — N342 Other urethritis: Secondary | ICD-10-CM | POA: Diagnosis not present

## 2015-07-12 DIAGNOSIS — M1991 Primary osteoarthritis, unspecified site: Secondary | ICD-10-CM | POA: Diagnosis not present

## 2015-07-12 DIAGNOSIS — Z6829 Body mass index (BMI) 29.0-29.9, adult: Secondary | ICD-10-CM | POA: Diagnosis not present

## 2015-08-29 DIAGNOSIS — N390 Urinary tract infection, site not specified: Secondary | ICD-10-CM | POA: Diagnosis not present

## 2015-08-29 DIAGNOSIS — Z6829 Body mass index (BMI) 29.0-29.9, adult: Secondary | ICD-10-CM | POA: Diagnosis not present

## 2015-08-29 DIAGNOSIS — Z1389 Encounter for screening for other disorder: Secondary | ICD-10-CM | POA: Diagnosis not present

## 2015-08-29 DIAGNOSIS — M1990 Unspecified osteoarthritis, unspecified site: Secondary | ICD-10-CM | POA: Diagnosis not present

## 2015-10-10 DIAGNOSIS — H5203 Hypermetropia, bilateral: Secondary | ICD-10-CM | POA: Diagnosis not present

## 2015-10-10 DIAGNOSIS — H25813 Combined forms of age-related cataract, bilateral: Secondary | ICD-10-CM | POA: Diagnosis not present

## 2015-10-10 DIAGNOSIS — H52223 Regular astigmatism, bilateral: Secondary | ICD-10-CM | POA: Diagnosis not present

## 2015-10-10 DIAGNOSIS — H25811 Combined forms of age-related cataract, right eye: Secondary | ICD-10-CM | POA: Diagnosis not present

## 2015-10-19 DIAGNOSIS — R69 Illness, unspecified: Secondary | ICD-10-CM | POA: Diagnosis not present

## 2015-11-16 DIAGNOSIS — R69 Illness, unspecified: Secondary | ICD-10-CM | POA: Diagnosis not present

## 2015-11-18 ENCOUNTER — Encounter (HOSPITAL_COMMUNITY): Payer: Self-pay

## 2015-11-18 NOTE — Patient Instructions (Signed)
Patricia Hodges  11/18/2015     @PREFPERIOPPHARMACY @   Your procedure is scheduled on 11/28/2015.  Report to [facility] at 6:15 A.M.  Call this number if you have problems the morning of surgery:  [phone]   Remember:  Do not eat food or drink liquids after midnight.  Take these medicines the morning of surgery with A SIP OF WATER : Prozac, VIcodin and  Prilosec   Do not wear jewelry, make-up or nail polish.  Do not wear lotions, powders, or perfumes, or deoderant.  Do not shave 48 hours prior to surgery.  Men may shave face and neck.  Do not bring valuables to the hospital.  [facility] is not responsible for any belongings or valuables.  Contacts, dentures or bridgework may not be worn into surgery.  Leave your suitcase in the car.  After surgery it may be brought to your room.  For patients admitted to the hospital, discharge time will be determined by your treatment team.  Patients discharged the day of surgery will not be allowed to drive home.   Name and phone number of your driver:   family Special instructions:  n/a  Please read over the following fact sheets that you were given. Care and Recovery After Surgery      A cataract is a clouding of the lens of the eye. When a lens becomes cloudy, vision is reduced based on the degree and nature of the clouding. Surgery may be needed to improve vision. Surgery removes the cloudy lens and usually replaces it with a substitute lens (intraocular lens, IOL). LET YOUR EYE DOCTOR KNOW ABOUT:  Allergies to food or medicine.  Medicines taken including herbs, eye drops, over-the-counter medicines, and creams.  Use of steroids (by mouth or creams).  Previous problems with anesthetics or numbing medicine.  History of bleeding problems or blood clots.  Previous surgery.  Other health problems, including diabetes and kidney problems.  Possibility of pregnancy, if this applies. RISKS AND  COMPLICATIONS  Infection.  Inflammation of the eyeball (endophthalmitis) that can spread to both eyes (sympathetic ophthalmia).  Poor wound healing.  If an IOL is inserted, it can later fall out of proper position. This is very uncommon.  Clouding of the part of your eye that holds an IOL in place. This is called an "after-cataract." These are uncommon but easily treated. BEFORE THE PROCEDURE  Do not eat or drink anything except small amounts of water for 8 to 12 before your surgery, or as directed by your caregiver.  Unless you are told otherwise, continue any eye drops you have been prescribed.  Talk to your primary caregiver about all other medicines that you take (both prescription and nonprescription). In some cases, you may need to stop or change medicines near the time of your surgery. This is most important if you are taking blood-thinning medicine.Do not stop medicines unless you are told to do so.  Arrange for someone to drive you to and from the procedure.  Do not put contact lenses in either eye on the day of your surgery. PROCEDURE There is more than one method for safely removing a cataract. Your doctor can explain the differences and help determine which is best for you. Phacoemulsification surgery is the most common form of cataract surgery.  An injection is given behind the eye or eye drops are given to make this a painless procedure.  A small cut (incision) is made on the edge of the clear,  dome-shaped surface that covers the front of the eye (cornea).  A tiny probe is painlessly inserted into the eye. This device gives off ultrasound waves that soften and break up the cloudy center of the lens. This makes it easier for the cloudy lens to be removed by suction.  An IOL may be implanted.  The normal lens of the eye is covered by a clear capsule. Part of that capsule is intentionally left in the eye to support the IOL.  Your surgeon may or may not use stitches to  close the incision. There are other forms of cataract surgery that require a larger incision and stitches to close the eye. This approach is taken in cases where the doctor feels that the cataract cannot be easily removed using phacoemulsification. AFTER THE PROCEDURE  When an IOL is implanted, it does not need care. It becomes a permanent part of your eye and cannot be seen or felt.  Your doctor will schedule follow-up exams to check on your progress.  Review your other medicines with your doctor to see which can be resumed after surgery.  Use eye drops or take medicine as prescribed by your doctor.   This information is not intended to replace advice given to you by your health care provider. Make sure you discuss any questions you have with your health care provider.   Document Released: 02/22/2011 Document Revised: 03/26/2014 Document Reviewed: 02/22/2011 Elsevier Interactive Patient Education [employer].

## 2015-11-23 ENCOUNTER — Encounter (HOSPITAL_COMMUNITY): Payer: Self-pay

## 2015-11-23 ENCOUNTER — Encounter (HOSPITAL_COMMUNITY)
Admission: RE | Admit: 2015-11-23 | Discharge: 2015-11-23 | Disposition: A | Discharge status: Home / Self Care | Payer: Medicare HMO | Source: Ambulatory Visit | Attending: Ophthalmology | Admitting: Ophthalmology

## 2015-11-23 ENCOUNTER — Other Ambulatory Visit: Payer: Self-pay

## 2015-11-23 DIAGNOSIS — Z01812 Encounter for preprocedural laboratory examination: Secondary | ICD-10-CM | POA: Insufficient documentation

## 2015-11-23 DIAGNOSIS — Z01810 Encounter for preprocedural cardiovascular examination: Secondary | ICD-10-CM | POA: Diagnosis not present

## 2015-11-23 LAB — BASIC METABOLIC PANEL
ANION GAP: 6 (ref 5–15)
BUN: 15 mg/dL (ref 6–20)
CO2: 26 mmol/L (ref 22–32)
Calcium: 9.3 mg/dL (ref 8.9–10.3)
Chloride: 108 mmol/L (ref 101–111)
Creatinine, Ser: 0.79 mg/dL (ref 0.44–1.00)
GFR calc Af Amer: >60 mL/min (ref >60)
GFR calc non Af Amer: >60 mL/min (ref >60)
GLUCOSE: 104 mg/dL — AB (ref 65–99)
POTASSIUM: 3.8 mmol/L (ref 3.5–5.1)
Sodium: 140 mmol/L (ref 135–145)

## 2015-11-23 LAB — CBC
HEMATOCRIT: 43.5 % (ref 36.0–46.0)
Hemoglobin: 14.3 g/dL (ref 12.0–15.0)
MCH: 30.2 pg (ref 26.0–34.0)
MCHC: 32.9 g/dL (ref 30.0–36.0)
MCV: 92 fL (ref 78.0–100.0)
PLATELETS: 222 10*3/uL (ref 150–400)
RBC: 4.73 MIL/uL (ref 3.87–5.11)
RDW: 13.1 % (ref 11.5–15.5)
WBC: 5.8 10*3/uL (ref 4.0–10.5)

## 2015-11-28 ENCOUNTER — Ambulatory Visit (HOSPITAL_COMMUNITY): Payer: Medicare HMO | Admitting: Anesthesiology

## 2015-11-28 ENCOUNTER — Ambulatory Visit (HOSPITAL_COMMUNITY)
Admission: RE | Admit: 2015-11-28 | Discharge: 2015-11-28 | Disposition: A | Discharge status: Home / Self Care | Payer: Medicare HMO | Source: Ambulatory Visit | Attending: Ophthalmology | Admitting: Ophthalmology

## 2015-11-28 ENCOUNTER — Encounter (HOSPITAL_COMMUNITY)
Admission: RE | Disposition: A | Discharge status: Home / Self Care | Payer: Self-pay | Source: Ambulatory Visit | Attending: Ophthalmology

## 2015-11-28 ENCOUNTER — Encounter (HOSPITAL_COMMUNITY): Payer: Self-pay | Admitting: *Deleted

## 2015-11-28 DIAGNOSIS — Z87891 Personal history of nicotine dependence: Secondary | ICD-10-CM | POA: Diagnosis not present

## 2015-11-28 DIAGNOSIS — H2511 Age-related nuclear cataract, right eye: Secondary | ICD-10-CM | POA: Diagnosis not present

## 2015-11-28 DIAGNOSIS — Z79899 Other long term (current) drug therapy: Secondary | ICD-10-CM | POA: Diagnosis not present

## 2015-11-28 DIAGNOSIS — K219 Gastro-esophageal reflux disease without esophagitis: Secondary | ICD-10-CM | POA: Diagnosis not present

## 2015-11-28 DIAGNOSIS — H25811 Combined forms of age-related cataract, right eye: Secondary | ICD-10-CM | POA: Diagnosis not present

## 2015-11-28 HISTORY — PX: CATARACT EXTRACTION W/PHACO: SHX586

## 2015-11-28 SURGERY — PHACOEMULSIFICATION, CATARACT, WITH IOL INSERTION
Anesthesia: Monitor Anesthesia Care | Site: Eye | Laterality: Right

## 2015-11-28 MED ORDER — FENTANYL CITRATE (PF) 100 MCG/2ML IJ SOLN
25.0000 ug | Freq: Once | INTRAMUSCULAR | Status: AC
  Administered 2015-11-28: 25 ug via INTRAVENOUS

## 2015-11-28 MED ORDER — BSS IO SOLN
INTRAOCULAR | Status: DC | PRN
  Administered 2015-11-28: 15 mL

## 2015-11-28 MED ORDER — FENTANYL CITRATE (PF) 100 MCG/2ML IJ SOLN
INTRAMUSCULAR | Status: AC
  Filled 2015-11-28: qty 2

## 2015-11-28 MED ORDER — POVIDONE-IODINE 5 % OP SOLN
OPHTHALMIC | Status: DC | PRN
  Administered 2015-11-28: 1 via OPHTHALMIC

## 2015-11-28 MED ORDER — LIDOCAINE HCL 3.5 % OP GEL
1.0000 "application " | Freq: Once | OPHTHALMIC | Status: AC
  Administered 2015-11-28: 1 via OPHTHALMIC

## 2015-11-28 MED ORDER — MIDAZOLAM HCL 2 MG/2ML IJ SOLN
2.0000 mg | Freq: Once | INTRAMUSCULAR | Status: AC
  Administered 2015-11-28: 2 mg via INTRAVENOUS

## 2015-11-28 MED ORDER — CYCLOPENTOLATE-PHENYLEPHRINE 0.2-1 % OP SOLN
1.0000 [drp] | OPHTHALMIC | Status: AC
  Administered 2015-11-28 (×3): 1 [drp] via OPHTHALMIC

## 2015-11-28 MED ORDER — PHENYLEPHRINE HCL 2.5 % OP SOLN
1.0000 [drp] | OPHTHALMIC | Status: AC
  Administered 2015-11-28 (×3): 1 [drp] via OPHTHALMIC

## 2015-11-28 MED ORDER — LIDOCAINE 3.5 % OP GEL OPTIME - NO CHARGE
OPHTHALMIC | Status: DC | PRN
Start: 2015-11-28 — End: 2015-11-28
  Administered 2015-11-28: 1 [drp] via OPHTHALMIC

## 2015-11-28 MED ORDER — LIDOCAINE HCL (PF) 1 % IJ SOLN
INTRAMUSCULAR | Status: DC | PRN
  Administered 2015-11-28: .6 mL

## 2015-11-28 MED ORDER — LACTATED RINGERS IV SOLN
INTRAVENOUS | Status: DC
  Administered 2015-11-28: 1000 mL via INTRAVENOUS

## 2015-11-28 MED ORDER — PROVISC 10 MG/ML IO SOLN
INTRAOCULAR | Status: DC | PRN
  Administered 2015-11-28: 0.85 mL via INTRAOCULAR

## 2015-11-28 MED ORDER — EPINEPHRINE HCL 1 MG/ML IJ SOLN
INTRAOCULAR | Status: DC | PRN
  Administered 2015-11-28: 500 mL

## 2015-11-28 MED ORDER — EPINEPHRINE HCL 1 MG/ML IJ SOLN
INTRAMUSCULAR | Status: AC
  Filled 2015-11-28: qty 1

## 2015-11-28 MED ORDER — NEOMYCIN-POLYMYXIN-DEXAMETH 3.5-10000-0.1 OP SUSP
OPHTHALMIC | Status: DC | PRN
  Administered 2015-11-28: 2 [drp] via OPHTHALMIC

## 2015-11-28 MED ORDER — MIDAZOLAM HCL 2 MG/2ML IJ SOLN
INTRAMUSCULAR | Status: AC
  Filled 2015-11-28: qty 2

## 2015-11-28 MED ORDER — TETRACAINE HCL 0.5 % OP SOLN
1.0000 [drp] | OPHTHALMIC | Status: AC
  Administered 2015-11-28 (×3): 1 [drp] via OPHTHALMIC

## 2015-11-28 SURGICAL SUPPLY — 10 items
CLOTH BEACON ORANGE TIMEOUT ST (SAFETY) ×2 IMPLANT
EYE SHIELD UNIVERSAL CLEAR (GAUZE/BANDAGES/DRESSINGS) ×1 IMPLANT
GLOVE BIOGEL PI IND STRL 7.0 (GLOVE) IMPLANT
GLOVE BIOGEL PI INDICATOR 7.0 (GLOVE) ×2
GLOVE EXAM NITRILE MD LF STRL (GLOVE) ×1 IMPLANT
PAD ARMBOARD 7.5X6 YLW CONV (MISCELLANEOUS) ×1 IMPLANT
SIGHTPATH CAT PROC W REG LENS (Ophthalmic Related) ×2 IMPLANT
SYRINGE LUER LOK 1CC (MISCELLANEOUS) ×1 IMPLANT
TAPE TRANSPORE STRL 2 31045 (GAUZE/BANDAGES/DRESSINGS) ×2 IMPLANT
WATER STERILE IRR 250ML POUR (IV SOLUTION) ×1 IMPLANT

## 2015-11-28 NOTE — Anesthesia Procedure Notes (Signed)
Procedure Name: MAC Date/Time: 11/28/2015 7:26 AM Performed by: Andree Elk, AMY A Pre-anesthesia Checklist: Patient identified, Timeout performed, Emergency Drugs available, Suction available and Patient being monitored Oxygen Delivery Method: Nasal cannula

## 2015-11-28 NOTE — Anesthesia Postprocedure Evaluation (Signed)
Anesthesia Post Note  Patient: Patricia Hodges  Procedure(s) Performed: Procedure(s) (LRB): CATARACT EXTRACTION PHACO AND INTRAOCULAR LENS PLACEMENT (IOC) RIGHT (Right)  Patient location during evaluation: Short Stay Anesthesia Type: MAC Level of consciousness: awake and alert and oriented Pain management: pain level controlled Vital Signs Assessment: post-procedure vital signs reviewed and stable Respiratory status: spontaneous breathing Cardiovascular status: stable Postop Assessment: no signs of nausea or vomiting Anesthetic complications: no    Last Vitals:  Vitals:   11/28/15 0715 11/28/15 0720  BP: 132/80 131/74  Pulse:    Resp: 19 18  Temp:      Last Pain:  Vitals:   11/28/15 0651  TempSrc: Oral                 ADAMS, AMY A

## 2015-11-28 NOTE — Transfer of Care (Signed)
Immediate Anesthesia Transfer of Care Note  Patient: Patricia Hodges  Procedure(s) Performed: Procedure(s) with comments: CATARACT EXTRACTION PHACO AND INTRAOCULAR LENS PLACEMENT (IOC) RIGHT (Right) - CDE: 6.96  Patient Location: Short Stay  Anesthesia Type:MAC  Level of Consciousness: awake, alert , oriented and patient cooperative  Airway & Oxygen Therapy: Patient Spontanous Breathing  Post-op Assessment: Report given to RN and Post -op Vital signs reviewed and stable  Post vital signs: Reviewed and stable  Last Vitals:  Vitals:   11/28/15 0715 11/28/15 0720  BP: 132/80 131/74  Pulse:    Resp: 19 18  Temp:      Last Pain:  Vitals:   11/28/15 0651  TempSrc: Oral      Patients Stated Pain Goal: 6 ([id number])  Complications: No apparent anesthesia complications

## 2015-11-28 NOTE — Anesthesia Preprocedure Evaluation (Signed)
Anesthesia Evaluation  Patient identified by MRN, date of birth, ID band Patient awake    Reviewed: Allergy & Precautions, H&P , NPO status , Patient's Chart, lab work & pertinent test results  Airway Mallampati: II   Neck ROM: full    Dental   Pulmonary former smoker,    breath sounds clear to auscultation       Cardiovascular negative cardio ROS   Rhythm:regular Rate:Normal     Neuro/Psych  Neuromuscular disease    GI/Hepatic GERD  ,  Endo/Other    Renal/GU      Musculoskeletal  (+) Arthritis ,   Abdominal   Peds  Hematology   Anesthesia Other Findings   Reproductive/Obstetrics                             Anesthesia Physical Anesthesia Plan  ASA: II  Anesthesia Plan: MAC   Post-op Pain Management:    Induction: Intravenous  Airway Management Planned: Nasal Cannula  Additional Equipment:   Intra-op Plan:   Post-operative Plan:   Informed Consent: I have reviewed the patients History and Physical, chart, labs and discussed the procedure including the risks, benefits and alternatives for the proposed anesthesia with the patient or authorized representative who has indicated his/her understanding and acceptance.     Plan Discussed with: CRNA, Anesthesiologist and Surgeon  Anesthesia Plan Comments:         Anesthesia Quick Evaluation

## 2015-11-28 NOTE — Op Note (Signed)
Date of Admission: 11/28/2015  Date of Surgery: 11/28/2015   Pre-Op Dx: Cataract Right Eye  Post-Op Dx: Senile Combined Cataract Right  Eye,  Dx Code [id number]  Surgeon: Tonny Branch, M.D.  Assistants: None  Anesthesia: Topical with MAC  Indications: Painless, progressive loss of vision with compromise of daily activities.  Surgery: Cataract Extraction with Intraocular lens Implant Right Eye  Discription: The patient had dilating drops and viscous lidocaine placed into the Right eye in the pre-op holding area. After transfer to the operating room, a time out was performed. The patient was then prepped and draped. Beginning with a 56 degree blade a paracentesis port was made at the surgeon's 2 o'clock position. The anterior chamber was then filled with 1% non-preserved lidocaine. This was followed by filling the anterior chamber with Provisc.  A 2.45mm keratome blade was used to make a clear corneal incision at the temporal limbus.  A bent cystatome needle was used to create a continuous tear capsulotomy. Hydrodissection was performed with balanced salt solution on a Fine canula. The lens nucleus was then removed using the phacoemulsification handpiece. Residual cortex was removed with the I&A handpiece. The anterior chamber and capsular bag were refilled with Provisc. A posterior chamber intraocular lens was placed into the capsular bag with it's injector. The implant was positioned with the Kuglan hook. The Provisc was then removed from the anterior chamber and capsular bag with the I&A handpiece. Stromal hydration of the main incision and paracentesis port was performed with BSS on a Fine canula. The wounds were tested for leak which was negative. The patient tolerated the procedure well. There were no operative complications. The patient was then transferred to the recovery room in stable condition.  Complications: None  Specimen: None  EBL: None  Prosthetic device: Hoya iSert 250, power 22.5  D, SN [id number].

## 2015-11-28 NOTE — Discharge Instructions (Signed)

## 2015-11-28 NOTE — H&P (Signed)
I have reviewed the H&P, the patient was re-examined, and I have identified no interval changes in medical condition and plan of care since the history and physical of record  

## 2015-12-01 ENCOUNTER — Encounter (HOSPITAL_COMMUNITY): Payer: Self-pay | Admitting: Ophthalmology

## 2015-12-08 ENCOUNTER — Other Ambulatory Visit (HOSPITAL_COMMUNITY): Payer: Self-pay | Admitting: Orthopaedic Surgery

## 2015-12-08 DIAGNOSIS — M5412 Radiculopathy, cervical region: Secondary | ICD-10-CM

## 2015-12-08 DIAGNOSIS — M47812 Spondylosis without myelopathy or radiculopathy, cervical region: Secondary | ICD-10-CM | POA: Diagnosis not present

## 2015-12-08 DIAGNOSIS — M545 Low back pain: Secondary | ICD-10-CM | POA: Diagnosis not present

## 2015-12-12 DIAGNOSIS — H25812 Combined forms of age-related cataract, left eye: Secondary | ICD-10-CM | POA: Diagnosis not present

## 2015-12-12 DIAGNOSIS — H5201 Hypermetropia, right eye: Secondary | ICD-10-CM | POA: Diagnosis not present

## 2015-12-12 DIAGNOSIS — H5231 Anisometropia: Secondary | ICD-10-CM | POA: Diagnosis not present

## 2015-12-12 DIAGNOSIS — Z961 Presence of intraocular lens: Secondary | ICD-10-CM | POA: Diagnosis not present

## 2015-12-12 MED ORDER — OXYCODONE HCL 5 MG/5ML PO SOLN: 5.0000 mg | Freq: Once | ORAL | Status: DC | PRN

## 2015-12-12 MED ORDER — FENTANYL CITRATE (PF) 100 MCG/2ML IJ SOLN
25.0000 ug | INTRAMUSCULAR | Status: DC | PRN
Start: 2015-12-12 — End: 2015-12-14

## 2015-12-12 MED ORDER — ONDANSETRON HCL 4 MG/2ML IJ SOLN: 4.0000 mg | Freq: Four times a day (QID) | INTRAMUSCULAR | Status: DC | PRN

## 2015-12-12 MED ORDER — OXYCODONE HCL 5 MG PO TABS: 5.0000 mg | ORAL_TABLET | Freq: Once | ORAL | Status: DC | PRN

## 2015-12-13 ENCOUNTER — Encounter (HOSPITAL_COMMUNITY): Payer: Self-pay

## 2015-12-13 ENCOUNTER — Encounter (HOSPITAL_COMMUNITY)
Admission: RE | Admit: 2015-12-13 | Discharge: 2015-12-13 | Disposition: A | Discharge status: Home / Self Care | Payer: Medicare HMO | Source: Ambulatory Visit | Attending: Ophthalmology | Admitting: Ophthalmology

## 2015-12-14 ENCOUNTER — Ambulatory Visit (HOSPITAL_COMMUNITY)
Admission: RE | Admit: 2015-12-14 | Discharge: 2015-12-14 | Disposition: A | Discharge status: Home / Self Care | Payer: Medicare HMO | Source: Ambulatory Visit | Attending: Orthopaedic Surgery | Admitting: Orthopaedic Surgery

## 2015-12-14 DIAGNOSIS — M47892 Other spondylosis, cervical region: Secondary | ICD-10-CM | POA: Insufficient documentation

## 2015-12-14 DIAGNOSIS — M778 Other enthesopathies, not elsewhere classified: Secondary | ICD-10-CM | POA: Diagnosis not present

## 2015-12-14 DIAGNOSIS — M4802 Spinal stenosis, cervical region: Secondary | ICD-10-CM | POA: Insufficient documentation

## 2015-12-14 DIAGNOSIS — M5412 Radiculopathy, cervical region: Secondary | ICD-10-CM | POA: Diagnosis not present

## 2015-12-15 ENCOUNTER — Ambulatory Visit (HOSPITAL_COMMUNITY): Payer: Medicare HMO | Admitting: Anesthesiology

## 2015-12-15 ENCOUNTER — Encounter (HOSPITAL_COMMUNITY)
Admission: RE | Disposition: A | Discharge status: Home / Self Care | Payer: Self-pay | Source: Ambulatory Visit | Attending: Ophthalmology

## 2015-12-15 ENCOUNTER — Ambulatory Visit (HOSPITAL_COMMUNITY)
Admission: RE | Admit: 2015-12-15 | Discharge: 2015-12-15 | Disposition: A | Discharge status: Home / Self Care | Payer: Medicare HMO | Source: Ambulatory Visit | Attending: Ophthalmology | Admitting: Ophthalmology

## 2015-12-15 ENCOUNTER — Encounter (HOSPITAL_COMMUNITY): Payer: Self-pay | Admitting: *Deleted

## 2015-12-15 DIAGNOSIS — G709 Myoneural disorder, unspecified: Secondary | ICD-10-CM | POA: Diagnosis not present

## 2015-12-15 DIAGNOSIS — Z87891 Personal history of nicotine dependence: Secondary | ICD-10-CM | POA: Insufficient documentation

## 2015-12-15 DIAGNOSIS — K219 Gastro-esophageal reflux disease without esophagitis: Secondary | ICD-10-CM | POA: Diagnosis not present

## 2015-12-15 DIAGNOSIS — M1990 Unspecified osteoarthritis, unspecified site: Secondary | ICD-10-CM | POA: Diagnosis not present

## 2015-12-15 DIAGNOSIS — Z79899 Other long term (current) drug therapy: Secondary | ICD-10-CM | POA: Insufficient documentation

## 2015-12-15 DIAGNOSIS — H2512 Age-related nuclear cataract, left eye: Secondary | ICD-10-CM | POA: Diagnosis not present

## 2015-12-15 DIAGNOSIS — H25812 Combined forms of age-related cataract, left eye: Secondary | ICD-10-CM | POA: Diagnosis not present

## 2015-12-15 HISTORY — PX: CATARACT EXTRACTION W/PHACO: SHX586

## 2015-12-15 SURGERY — PHACOEMULSIFICATION, CATARACT, WITH IOL INSERTION
Anesthesia: Monitor Anesthesia Care | Site: Eye | Laterality: Left

## 2015-12-15 MED ORDER — CYCLOPENTOLATE-PHENYLEPHRINE 0.2-1 % OP SOLN
1.0000 [drp] | OPHTHALMIC | Status: AC
  Administered 2015-12-15 (×3): 1 [drp] via OPHTHALMIC

## 2015-12-15 MED ORDER — PROVISC 10 MG/ML IO SOLN
INTRAOCULAR | Status: DC | PRN
  Administered 2015-12-15: 0.85 mL via INTRAOCULAR

## 2015-12-15 MED ORDER — NEOMYCIN-POLYMYXIN-DEXAMETH 3.5-10000-0.1 OP SUSP
OPHTHALMIC | Status: DC | PRN
  Administered 2015-12-15: 2 [drp] via OPHTHALMIC

## 2015-12-15 MED ORDER — FENTANYL CITRATE (PF) 100 MCG/2ML IJ SOLN
INTRAMUSCULAR | Status: AC
  Filled 2015-12-15: qty 2

## 2015-12-15 MED ORDER — LIDOCAINE HCL 3.5 % OP GEL
1.0000 "application " | Freq: Once | OPHTHALMIC | Status: AC
  Administered 2015-12-15: 1 via OPHTHALMIC

## 2015-12-15 MED ORDER — BSS IO SOLN
INTRAOCULAR | Status: DC | PRN
  Administered 2015-12-15: 15 mL via INTRAOCULAR

## 2015-12-15 MED ORDER — LIDOCAINE HCL (PF) 1 % IJ SOLN
INTRAMUSCULAR | Status: DC | PRN
  Administered 2015-12-15: .6 mL

## 2015-12-15 MED ORDER — FENTANYL CITRATE (PF) 100 MCG/2ML IJ SOLN
25.0000 ug | INTRAMUSCULAR | Status: AC | PRN
Start: 2015-12-15 — End: 2015-12-15
  Administered 2015-12-15 (×2): 25 ug via INTRAVENOUS

## 2015-12-15 MED ORDER — PHENYLEPHRINE HCL 2.5 % OP SOLN
1.0000 [drp] | OPHTHALMIC | Status: AC
  Administered 2015-12-15 (×3): 1 [drp] via OPHTHALMIC

## 2015-12-15 MED ORDER — MIDAZOLAM HCL 2 MG/2ML IJ SOLN
INTRAMUSCULAR | Status: AC
  Filled 2015-12-15: qty 2

## 2015-12-15 MED ORDER — LACTATED RINGERS IV SOLN
INTRAVENOUS | Status: DC
  Administered 2015-12-15: 12:00:00 via INTRAVENOUS

## 2015-12-15 MED ORDER — POVIDONE-IODINE 5 % OP SOLN
OPHTHALMIC | Status: DC | PRN
  Administered 2015-12-15: 1 via OPHTHALMIC

## 2015-12-15 MED ORDER — EPINEPHRINE HCL 1 MG/ML IJ SOLN
INTRAOCULAR | Status: DC | PRN
  Administered 2015-12-15: 500 mL

## 2015-12-15 MED ORDER — TETRACAINE HCL 0.5 % OP SOLN
1.0000 [drp] | OPHTHALMIC | Status: AC
  Administered 2015-12-15 (×3): 1 [drp] via OPHTHALMIC

## 2015-12-15 MED ORDER — MIDAZOLAM HCL 2 MG/2ML IJ SOLN
INTRAMUSCULAR | Status: DC | PRN
  Administered 2015-12-15: 2 mg via INTRAVENOUS

## 2015-12-15 MED ORDER — MIDAZOLAM HCL 2 MG/2ML IJ SOLN
1.0000 mg | INTRAMUSCULAR | Status: DC | PRN
  Administered 2015-12-15: 2 mg via INTRAVENOUS

## 2015-12-15 SURGICAL SUPPLY — 12 items
CLOTH BEACON ORANGE TIMEOUT ST (SAFETY) ×1 IMPLANT
EYE SHIELD UNIVERSAL CLEAR (GAUZE/BANDAGES/DRESSINGS) ×2 IMPLANT
GLOVE BIOGEL PI IND STRL 6.5 (GLOVE) IMPLANT
GLOVE BIOGEL PI IND STRL 7.0 (GLOVE) IMPLANT
GLOVE BIOGEL PI INDICATOR 6.5 (GLOVE) ×1
GLOVE BIOGEL PI INDICATOR 7.0 (GLOVE) ×1
PAD ARMBOARD 7.5X6 YLW CONV (MISCELLANEOUS) ×1 IMPLANT
SIGHTPATH CAT PROC W REG LENS (Ophthalmic Related) ×2 IMPLANT
SYRINGE LUER LOK 1CC (MISCELLANEOUS) ×1 IMPLANT
TAPE SURG TRANSPORE 1 IN (GAUZE/BANDAGES/DRESSINGS) ×1 IMPLANT
TAPE SURGICAL TRANSPORE 1 IN (GAUZE/BANDAGES/DRESSINGS) ×1
WATER STERILE IRR 250ML POUR (IV SOLUTION) ×1 IMPLANT

## 2015-12-15 NOTE — Op Note (Signed)
Date of Admission: 12/15/2015  Date of Surgery: 12/15/2015   Pre-Op Dx: Cataract Left Eye  Post-Op Dx: Senile Combined Cataract Left  Eye,  Dx Code [id number]  Surgeon: Tonny Branch, M.D.  Assistants: None  Anesthesia: Topical with MAC  Indications: Painless, progressive loss of vision with compromise of daily activities.  Surgery: Cataract Extraction with Intraocular lens Implant Left Eye  Discription: The patient had dilating drops and viscous lidocaine placed into the Left eye in the pre-op holding area. After transfer to the operating room, a time out was performed. The patient was then prepped and draped. Beginning with a 70 degree blade a paracentesis port was made at the surgeon's 2 o'clock position. The anterior chamber was then filled with 1% non-preserved lidocaine. This was followed by filling the anterior chamber with Provisc.  A 2.82mm keratome blade was used to make a clear corneal incision at the temporal limbus.  A bent cystatome needle was used to create a continuous tear capsulotomy. Hydrodissection was performed with balanced salt solution on a Fine canula. The lens nucleus was then removed using the phacoemulsification handpiece. Residual cortex was removed with the I&A handpiece. The anterior chamber and capsular bag were refilled with Provisc. A posterior chamber intraocular lens was placed into the capsular bag with it's injector. The implant was positioned with the Kuglan hook. The Provisc was then removed from the anterior chamber and capsular bag with the I&A handpiece. Stromal hydration of the main incision and paracentesis port was performed with BSS on a Fine canula. The wounds were tested for leak which was negative. The patient tolerated the procedure well. There were no operative complications. The patient was then transferred to the recovery room in stable condition.  Complications: None  Specimen: None  EBL: None  Prosthetic device: Hoya iSert 250, power 22.0 D, SN  [id number].

## 2015-12-15 NOTE — Anesthesia Postprocedure Evaluation (Signed)
  Anesthesia Post-op Note  Patient: Patricia Hodges  Procedure(s) Performed: Procedure(s) (LRB): CATARACT EXTRACTION PHACO AND INTRAOCULAR LENS PLACEMENT LEFT EYE CDE=9.04 (Left)  Patient Location:  Short Stay  Anesthesia Type: MAC  Level of Consciousness: awake  Airway and Oxygen Therapy: Patient Spontanous Breathing  Post-op Pain: none  Post-op Assessment: Post-op Vital signs reviewed, Patient's Cardiovascular Status Stable, Respiratory Function Stable, Patent Airway, No signs of Nausea or vomiting and Pain level controlled  Post-op Vital Signs: Reviewed and stable  Complications: No apparent anesthesia complications Anesthesia Post Note  Patient: Patricia Hodges  Procedure(s) Performed: Procedure(s) (LRB): CATARACT EXTRACTION PHACO AND INTRAOCULAR LENS PLACEMENT LEFT EYE CDE=9.04 (Left)  Anesthesia Post Evaluation  Last Vitals:  Vitals:   12/15/15 1225 12/15/15 1244  BP:  (!) 145/69  Pulse:  (!) 56  Resp: 16 20  Temp:  36.4 C    Last Pain:  Vitals:   12/15/15 1244  TempSrc: Oral                 Lakiah Dhingra

## 2015-12-15 NOTE — Transfer of Care (Signed)
Immediate Anesthesia Transfer of Care Note  Patient: Patricia Hodges  Procedure(s) Performed: Procedure(s) (LRB): CATARACT EXTRACTION PHACO AND INTRAOCULAR LENS PLACEMENT LEFT EYE CDE=9.04 (Left)  Patient Location: Shortstay  Anesthesia Type: MAC  Level of Consciousness: awake  Airway & Oxygen Therapy: Patient Spontanous Breathing   Post-op Assessment: Report given to PACU RN, Post -op Vital signs reviewed and stable and Patient moving all extremities  Post vital signs: Reviewed and stable  Complications: No apparent anesthesia complications

## 2015-12-15 NOTE — Anesthesia Preprocedure Evaluation (Signed)
Anesthesia Evaluation  Patient identified by MRN, date of birth, ID band Patient awake    Reviewed: Allergy & Precautions, H&P , NPO status , Patient's Chart, lab work & pertinent test results  Airway Mallampati: II   Neck ROM: full    Dental   Pulmonary former smoker,    breath sounds clear to auscultation       Cardiovascular negative cardio ROS   Rhythm:regular Rate:Normal     Neuro/Psych  Neuromuscular disease    GI/Hepatic GERD  Medicated,  Endo/Other    Renal/GU      Musculoskeletal  (+) Arthritis ,   Abdominal   Peds  Hematology   Anesthesia Other Findings   Reproductive/Obstetrics                             Anesthesia Physical Anesthesia Plan  ASA: II  Anesthesia Plan: MAC   Post-op Pain Management:    Induction: Intravenous  Airway Management Planned: Nasal Cannula  Additional Equipment:   Intra-op Plan:   Post-operative Plan:   Informed Consent: I have reviewed the patients History and Physical, chart, labs and discussed the procedure including the risks, benefits and alternatives for the proposed anesthesia with the patient or authorized representative who has indicated his/her understanding and acceptance.     Plan Discussed with: CRNA, Anesthesiologist and Surgeon  Anesthesia Plan Comments:         Anesthesia Quick Evaluation

## 2015-12-15 NOTE — H&P (Signed)
I have reviewed the H&P, the patient was re-examined, and I have identified no interval changes in medical condition and plan of care since the history and physical of record  

## 2015-12-15 NOTE — Discharge Instructions (Signed)

## 2015-12-15 NOTE — Anesthesia Procedure Notes (Signed)
Procedure Name: MAC Date/Time: 12/15/2015 12:26 PM Performed by: Vista Deck Pre-anesthesia Checklist: Patient identified, Emergency Drugs available, Suction available, Timeout performed and Patient being monitored Patient Re-evaluated:Patient Re-evaluated prior to inductionOxygen Delivery Method: Nasal Cannula

## 2015-12-19 ENCOUNTER — Encounter (HOSPITAL_COMMUNITY): Payer: Self-pay | Admitting: Ophthalmology

## 2015-12-22 ENCOUNTER — Ambulatory Visit (INDEPENDENT_AMBULATORY_CARE_PROVIDER_SITE_OTHER): Payer: Medicare HMO | Admitting: Orthopaedic Surgery

## 2015-12-22 ENCOUNTER — Ambulatory Visit (INDEPENDENT_AMBULATORY_CARE_PROVIDER_SITE_OTHER): Payer: Self-pay | Admitting: Orthopaedic Surgery

## 2015-12-22 DIAGNOSIS — M4212 Adult osteochondrosis of spine, cervical region: Secondary | ICD-10-CM

## 2016-01-11 ENCOUNTER — Encounter (HOSPITAL_COMMUNITY): Admission: RE | Payer: Self-pay | Source: Ambulatory Visit

## 2016-01-11 ENCOUNTER — Ambulatory Visit (HOSPITAL_COMMUNITY): Admission: RE | Admit: 2016-01-11 | Payer: Medicare HMO | Source: Ambulatory Visit | Admitting: Orthopaedic Surgery

## 2016-01-11 SURGERY — ANTERIOR CERVICAL DECOMPRESSION/DISCECTOMY FUSION 1 LEVEL
Anesthesia: General

## 2016-01-13 ENCOUNTER — Telehealth (INDEPENDENT_AMBULATORY_CARE_PROVIDER_SITE_OTHER): Payer: Self-pay | Admitting: Radiology

## 2016-01-13 NOTE — Telephone Encounter (Signed)
Received email from [place]. Patient states insurance denied her surgery and suggested Physical Therapy.  She has sent an appeal in for surgery, but would like to know if you feel that therapy would be beneficial?  CB [phone]

## 2016-01-13 NOTE — Telephone Encounter (Signed)
Send her to therapy please. ROV in one month. Let her know that after a month of therapy if she is still having problems we will resubmit request for surgery and it should be approved. ROV after 4 wks therapy . ucall thanks

## 2016-01-16 DIAGNOSIS — Z23 Encounter for immunization: Secondary | ICD-10-CM | POA: Diagnosis not present

## 2016-01-16 DIAGNOSIS — E6609 Other obesity due to excess calories: Secondary | ICD-10-CM | POA: Diagnosis not present

## 2016-01-16 DIAGNOSIS — G894 Chronic pain syndrome: Secondary | ICD-10-CM | POA: Diagnosis not present

## 2016-01-16 DIAGNOSIS — J302 Other seasonal allergic rhinitis: Secondary | ICD-10-CM | POA: Diagnosis not present

## 2016-01-16 DIAGNOSIS — Z6830 Body mass index (BMI) 30.0-30.9, adult: Secondary | ICD-10-CM | POA: Diagnosis not present

## 2016-01-16 DIAGNOSIS — R69 Illness, unspecified: Secondary | ICD-10-CM | POA: Diagnosis not present

## 2016-01-16 DIAGNOSIS — Z1389 Encounter for screening for other disorder: Secondary | ICD-10-CM | POA: Diagnosis not present

## 2016-01-16 DIAGNOSIS — H6593 Unspecified nonsuppurative otitis media, bilateral: Secondary | ICD-10-CM | POA: Diagnosis not present

## 2016-01-23 DIAGNOSIS — G894 Chronic pain syndrome: Secondary | ICD-10-CM | POA: Diagnosis not present

## 2016-01-23 DIAGNOSIS — E785 Hyperlipidemia, unspecified: Secondary | ICD-10-CM | POA: Diagnosis not present

## 2016-01-23 DIAGNOSIS — Z6830 Body mass index (BMI) 30.0-30.9, adult: Secondary | ICD-10-CM | POA: Diagnosis not present

## 2016-01-23 DIAGNOSIS — Z23 Encounter for immunization: Secondary | ICD-10-CM | POA: Diagnosis not present

## 2016-01-23 DIAGNOSIS — M159 Polyosteoarthritis, unspecified: Secondary | ICD-10-CM | POA: Diagnosis not present

## 2016-01-23 DIAGNOSIS — Z0000 Encounter for general adult medical examination without abnormal findings: Secondary | ICD-10-CM | POA: Diagnosis not present

## 2016-01-23 DIAGNOSIS — R69 Illness, unspecified: Secondary | ICD-10-CM | POA: Diagnosis not present

## 2016-01-23 DIAGNOSIS — E663 Overweight: Secondary | ICD-10-CM | POA: Diagnosis not present

## 2016-01-23 DIAGNOSIS — E559 Vitamin D deficiency, unspecified: Secondary | ICD-10-CM | POA: Diagnosis not present

## 2016-01-25 NOTE — Telephone Encounter (Signed)
See note

## 2016-01-25 NOTE — Telephone Encounter (Signed)
IC and NA, leave voice mail.  Will advise Baldo Ash what Dr Lorin Mercy has ordered and have her arrange PT.  Will call patient and advise also tomorrow.

## 2016-01-26 ENCOUNTER — Other Ambulatory Visit (INDEPENDENT_AMBULATORY_CARE_PROVIDER_SITE_OTHER): Payer: Self-pay | Admitting: Orthopedic Surgery

## 2016-01-26 NOTE — Telephone Encounter (Signed)
Can you do this one since you are in [place]?  Thanks!

## 2016-01-26 NOTE — Telephone Encounter (Signed)
Baldo Ash called patient to set up therapy. Patient is already on surgery schedule for Monday.

## 2016-01-26 NOTE — Pre-Procedure Instructions (Signed)
    Patricia Hodges  01/26/2016      [facility] [place], [place] - [id number] [place] [address] Phone: [phone] Fax: [phone]    Your procedure is scheduled on November 13  Report to [facility] at 1030 A.M.  Call this number if you have problems the morning of surgery:  [phone]   Remember:  Do not eat food or drink liquids after midnight.   Take these medicines the morning of surgery with A SIP OF WATER FLUoxetine (PROZAC), fluticasone (FLONASE), HYDROcodone-acetaminophen (NORCO/VICODIN, omeprazole (PRILOSEC)   Do not wear jewelry, make-up or nail polish.  Do not wear lotions, powders, or perfumes, or deoderant.  Do not shave 48 hours prior to surgery.    Do not bring valuables to the hospital.  [facility] is not responsible for any belongings or valuables.  Contacts, dentures or bridgework may not be worn into surgery.  Leave your suitcase in the car.  After surgery it may be brought to your room.  For patients admitted to the hospital, discharge time will be determined by your treatment team.  Patients discharged the day of surgery will not be allowed to drive home.    Special instructions:   [place]- Preparing For Surgery  Before surgery, you can play an important role. Because skin is not sterile, your skin needs to be as free of germs as possible. You can reduce the number of germs on your skin by washing with CHG (chlorahexidine gluconate) Soap before surgery.  CHG is an antiseptic cleaner which kills germs and bonds with the skin to continue killing germs even after washing.  Please do not use if you have an allergy to CHG or antibacterial soaps. If your skin becomes reddened/irritated stop using the CHG.  Do not shave (including legs and underarms) for at least 48 hours prior to first CHG shower. It is OK to shave your face.  Please follow these instructions  carefully.   1. Shower the NIGHT BEFORE SURGERY and the MORNING OF SURGERY with CHG.   2. If you chose to wash your hair, wash your hair first as usual with your normal shampoo.  3. After you shampoo, rinse your hair and body thoroughly to remove the shampoo.  4. Use CHG as you would any other liquid soap. You can apply CHG directly to the skin and wash gently with a scrungie or a clean washcloth.   5. Apply the CHG Soap to your body ONLY FROM THE NECK DOWN.  Do not use on open wounds or open sores. Avoid contact with your eyes, ears, mouth and genitals (private parts). Wash genitals (private parts) with your normal soap.  6. Wash thoroughly, paying special attention to the area where your surgery will be performed.  7. Thoroughly rinse your body with warm water from the neck down.  8. DO NOT shower/wash with your normal soap after using and rinsing off the CHG Soap.  9. Pat yourself dry with a CLEAN TOWEL.   10. Wear CLEAN PAJAMAS   11. Place CLEAN SHEETS on your bed the night of your first shower and DO NOT SLEEP WITH PETS.    Day of Surgery: Do not apply any deodorants/lotions. Please wear clean clothes to the hospital/surgery center.       Please read over the following fact sheets that you were given.

## 2016-01-27 ENCOUNTER — Ambulatory Visit (HOSPITAL_COMMUNITY)
Admission: RE | Admit: 2016-01-27 | Discharge: 2016-01-27 | Disposition: A | Discharge status: Home / Self Care | Payer: Medicare HMO | Source: Ambulatory Visit | Attending: Surgery | Admitting: Surgery

## 2016-01-27 ENCOUNTER — Encounter (HOSPITAL_COMMUNITY): Payer: Self-pay

## 2016-01-27 ENCOUNTER — Encounter (HOSPITAL_COMMUNITY)
Admission: RE | Admit: 2016-01-27 | Discharge: 2016-01-27 | Disposition: A | Discharge status: Home / Self Care | Payer: Medicare HMO | Source: Ambulatory Visit | Attending: Orthopaedic Surgery | Admitting: Orthopaedic Surgery

## 2016-01-27 DIAGNOSIS — M47812 Spondylosis without myelopathy or radiculopathy, cervical region: Secondary | ICD-10-CM | POA: Insufficient documentation

## 2016-01-27 DIAGNOSIS — Z87891 Personal history of nicotine dependence: Secondary | ICD-10-CM | POA: Diagnosis not present

## 2016-01-27 DIAGNOSIS — M4802 Spinal stenosis, cervical region: Secondary | ICD-10-CM | POA: Diagnosis not present

## 2016-01-27 DIAGNOSIS — Z01818 Encounter for other preprocedural examination: Secondary | ICD-10-CM

## 2016-01-27 DIAGNOSIS — Z01812 Encounter for preprocedural laboratory examination: Secondary | ICD-10-CM | POA: Insufficient documentation

## 2016-01-27 DIAGNOSIS — Z79899 Other long term (current) drug therapy: Secondary | ICD-10-CM | POA: Insufficient documentation

## 2016-01-27 DIAGNOSIS — E7800 Pure hypercholesterolemia, unspecified: Secondary | ICD-10-CM | POA: Insufficient documentation

## 2016-01-27 DIAGNOSIS — R918 Other nonspecific abnormal finding of lung field: Secondary | ICD-10-CM | POA: Diagnosis not present

## 2016-01-27 DIAGNOSIS — I700 Atherosclerosis of aorta: Secondary | ICD-10-CM | POA: Insufficient documentation

## 2016-01-27 LAB — COMPREHENSIVE METABOLIC PANEL WITH GFR
ALT: 27 U/L (ref 14–54)
AST: 28 U/L (ref 15–41)
Albumin: 4.3 g/dL (ref 3.5–5.0)
Alkaline Phosphatase: 74 U/L (ref 38–126)
Anion gap: 7 (ref 5–15)
BUN: 17 mg/dL (ref 6–20)
CO2: 26 mmol/L (ref 22–32)
Calcium: 9.7 mg/dL (ref 8.9–10.3)
Chloride: 106 mmol/L (ref 101–111)
Creatinine, Ser: 0.94 mg/dL (ref 0.44–1.00)
GFR calc Af Amer: >60 mL/min
GFR calc non Af Amer: 59 mL/min — ABNORMAL LOW
Glucose, Bld: 96 mg/dL (ref 65–99)
Potassium: 4 mmol/L (ref 3.5–5.1)
Sodium: 139 mmol/L (ref 135–145)
Total Bilirubin: 0.8 mg/dL (ref 0.3–1.2)
Total Protein: 6.8 g/dL (ref 6.5–8.1)

## 2016-01-27 LAB — URINALYSIS, ROUTINE W REFLEX MICROSCOPIC
BILIRUBIN URINE: NEGATIVE
Glucose, UA: NEGATIVE mg/dL
Hgb urine dipstick: NEGATIVE
KETONES UR: NEGATIVE mg/dL
LEUKOCYTES UA: NEGATIVE
NITRITE: NEGATIVE
PH: 6 (ref 5.0–8.0)
PROTEIN: NEGATIVE mg/dL
Specific Gravity, Urine: 1.014 (ref 1.005–1.030)

## 2016-01-27 LAB — CBC
HEMATOCRIT: 45 % (ref 36.0–46.0)
Hemoglobin: 14.8 g/dL (ref 12.0–15.0)
MCH: 29.8 pg (ref 26.0–34.0)
MCHC: 32.9 g/dL (ref 30.0–36.0)
MCV: 90.7 fL (ref 78.0–100.0)
PLATELETS: 256 10*3/uL (ref 150–400)
RBC: 4.96 MIL/uL (ref 3.87–5.11)
RDW: 13 % (ref 11.5–15.5)
WBC: 9.4 10*3/uL (ref 4.0–10.5)

## 2016-01-27 LAB — SURGICAL PCR SCREEN
MRSA, PCR: NEGATIVE
Staphylococcus aureus: NEGATIVE

## 2016-01-27 LAB — APTT: aPTT: 30 seconds (ref 24–36)

## 2016-01-27 LAB — PROTIME-INR
INR: 1.02
Prothrombin Time: 13.4 s (ref 11.4–15.2)

## 2016-01-27 NOTE — Progress Notes (Addendum)
Anesthesia chart review: Patient is 70 year old female scheduled for C5-6 ACDF on 01/30/2016 by Dr. Lorin Mercy.  History includes former smoker, eosinophilic gastroenteritis, H pylori, dysphagia, diverticulosis, hypercholesterolemia, cholecystectomy, hysterectomy bilateral cataract extraction 11/2015.  PCP is with [facility]. She denied shortness of breath, chest pain cough, and fever at pre-admit.  Meds include aspirin 81 mg, Prozac, Flonase, Norco, Prilosec. Patient instructed to hold aspirin 7 days prior to surgery.  BP 140/71   Pulse 62   Temp 36.7 C   Resp 20   Ht 5\' 4"  (1.626 m)   Wt 177 lb (80.3 kg)   SpO2 95%   BMI 30.38 kg/m   She denies seeing a cardiologist currently, but did see Dr. Vista Mink in  11/2011 and 02/2012 ([facility]; [place]) for palpitations. Dr. Francella Solian. Clair's note states, "Exercise Cardiolite stress test September 2014 10 METS, abnormal ECG portion of the stress test however myocardial perfusion revealed no ischemia with an ejection fraction of 64%"  11/23/2015 EKG: Normal sinus rhythm, low voltage QRS, nonspecific T wave abnormality.  01/27/16 CXR: IMPRESSION: Mild hyperinflation consistent with COPD. There is no pneumonia, CHF, nor other acute cardiopulmonary abnormality.  Preoperative labs noted.  If no acute changes then I anticipate that she can proceed as planned.  [name] [facility] Short Stay Center/Anesthesiology Phone [phone] 01/27/2016 4:14 PM

## 2016-01-27 NOTE — Progress Notes (Signed)
PCP - [facility] Cardiologist - denies  Chest x-ray - 01/27/16 EKG - 11/23/15 Stress Test -.20 years  ECHO - denies Cardiac Cath - denies  Patient instructed to stop aspirin 7 days prior to surgery  If positive for MRSA or Staph nasal swab will treat DOS with betadine    Patient denies shortness of breath, fever, cough and chest pain at PAT appointment

## 2016-01-30 ENCOUNTER — Ambulatory Visit (HOSPITAL_COMMUNITY): Payer: Medicare HMO

## 2016-01-30 ENCOUNTER — Encounter (HOSPITAL_COMMUNITY)
Admission: RE | Disposition: A | Discharge status: Home / Self Care | Payer: Self-pay | Source: Ambulatory Visit | Attending: Orthopaedic Surgery

## 2016-01-30 ENCOUNTER — Ambulatory Visit (HOSPITAL_COMMUNITY): Payer: Medicare HMO | Admitting: Vascular Surgery

## 2016-01-30 ENCOUNTER — Encounter (HOSPITAL_COMMUNITY): Payer: Self-pay | Admitting: General Practice

## 2016-01-30 ENCOUNTER — Observation Stay (HOSPITAL_COMMUNITY)
Admission: RE | Admit: 2016-01-30 | Discharge: 2016-01-31 | Disposition: A | Discharge status: Home / Self Care | Payer: Medicare HMO | Source: Ambulatory Visit | Attending: Orthopaedic Surgery | Admitting: Orthopaedic Surgery

## 2016-01-30 ENCOUNTER — Ambulatory Visit (HOSPITAL_COMMUNITY): Payer: Medicare HMO | Admitting: Anesthesiology

## 2016-01-30 DIAGNOSIS — I700 Atherosclerosis of aorta: Secondary | ICD-10-CM | POA: Diagnosis not present

## 2016-01-30 DIAGNOSIS — Z888 Allergy status to other drugs, medicaments and biological substances status: Secondary | ICD-10-CM | POA: Insufficient documentation

## 2016-01-30 DIAGNOSIS — Z9842 Cataract extraction status, left eye: Secondary | ICD-10-CM | POA: Insufficient documentation

## 2016-01-30 DIAGNOSIS — E7800 Pure hypercholesterolemia, unspecified: Secondary | ICD-10-CM | POA: Insufficient documentation

## 2016-01-30 DIAGNOSIS — M4322 Fusion of spine, cervical region: Secondary | ICD-10-CM | POA: Diagnosis not present

## 2016-01-30 DIAGNOSIS — M47812 Spondylosis without myelopathy or radiculopathy, cervical region: Secondary | ICD-10-CM | POA: Diagnosis not present

## 2016-01-30 DIAGNOSIS — M50121 Cervical disc disorder at C4-C5 level with radiculopathy: Secondary | ICD-10-CM | POA: Diagnosis not present

## 2016-01-30 DIAGNOSIS — M1990 Unspecified osteoarthritis, unspecified site: Secondary | ICD-10-CM | POA: Insufficient documentation

## 2016-01-30 DIAGNOSIS — K219 Gastro-esophageal reflux disease without esophagitis: Secondary | ICD-10-CM | POA: Insufficient documentation

## 2016-01-30 DIAGNOSIS — Z961 Presence of intraocular lens: Secondary | ICD-10-CM | POA: Diagnosis not present

## 2016-01-30 DIAGNOSIS — Z87891 Personal history of nicotine dependence: Secondary | ICD-10-CM | POA: Diagnosis not present

## 2016-01-30 DIAGNOSIS — M4722 Other spondylosis with radiculopathy, cervical region: Secondary | ICD-10-CM | POA: Diagnosis not present

## 2016-01-30 DIAGNOSIS — Z8719 Personal history of other diseases of the digestive system: Secondary | ICD-10-CM | POA: Diagnosis not present

## 2016-01-30 DIAGNOSIS — Z9841 Cataract extraction status, right eye: Secondary | ICD-10-CM | POA: Insufficient documentation

## 2016-01-30 DIAGNOSIS — Z884 Allergy status to anesthetic agent status: Secondary | ICD-10-CM | POA: Diagnosis not present

## 2016-01-30 DIAGNOSIS — M4802 Spinal stenosis, cervical region: Secondary | ICD-10-CM | POA: Diagnosis not present

## 2016-01-30 DIAGNOSIS — Z419 Encounter for procedure for purposes other than remedying health state, unspecified: Secondary | ICD-10-CM

## 2016-01-30 DIAGNOSIS — Z7982 Long term (current) use of aspirin: Secondary | ICD-10-CM | POA: Diagnosis not present

## 2016-01-30 DIAGNOSIS — K5281 Eosinophilic gastritis or gastroenteritis: Secondary | ICD-10-CM | POA: Diagnosis not present

## 2016-01-30 HISTORY — PX: ANTERIOR CERVICAL DECOMP/DISCECTOMY FUSION: SHX1161

## 2016-01-30 SURGERY — ANTERIOR CERVICAL DECOMPRESSION/DISCECTOMY FUSION 1 LEVEL
Anesthesia: General

## 2016-01-30 MED ORDER — DEXAMETHASONE SODIUM PHOSPHATE 10 MG/ML IJ SOLN
INTRAMUSCULAR | Status: AC
  Filled 2016-01-30: qty 1

## 2016-01-30 MED ORDER — FLUOXETINE HCL 10 MG PO CAPS
10.0000 mg | ORAL_CAPSULE | Freq: Every day | ORAL | Status: DC
  Administered 2016-01-31: 10 mg via ORAL
  Filled 2016-01-30 (×2): qty 1

## 2016-01-30 MED ORDER — DEXAMETHASONE SODIUM PHOSPHATE 10 MG/ML IJ SOLN
INTRAMUSCULAR | Status: DC | PRN
  Administered 2016-01-30: 10 mg via INTRAVENOUS

## 2016-01-30 MED ORDER — MENTHOL 3 MG MT LOZG: 1.0000 | LOZENGE | OROMUCOSAL | Status: DC | PRN

## 2016-01-30 MED ORDER — SUGAMMADEX SODIUM 200 MG/2ML IV SOLN
INTRAVENOUS | Status: AC
  Filled 2016-01-30: qty 2

## 2016-01-30 MED ORDER — FENTANYL CITRATE (PF) 100 MCG/2ML IJ SOLN
INTRAMUSCULAR | Status: DC | PRN
  Administered 2016-01-30: 100 ug via INTRAVENOUS
  Administered 2016-01-30 (×2): 50 ug via INTRAVENOUS

## 2016-01-30 MED ORDER — METHOCARBAMOL 500 MG PO TABS
ORAL_TABLET | ORAL | Status: AC
  Filled 2016-01-30: qty 1

## 2016-01-30 MED ORDER — OXYCODONE-ACETAMINOPHEN 5-325 MG PO TABS
1.0000 | ORAL_TABLET | Freq: Four times a day (QID) | ORAL | Status: DC | PRN
  Administered 2016-01-30: 1 via ORAL

## 2016-01-30 MED ORDER — PHENYLEPHRINE HCL 10 MG/ML IJ SOLN
INTRAMUSCULAR | Status: DC | PRN
  Administered 2016-01-30: 80 ug via INTRAVENOUS

## 2016-01-30 MED ORDER — PROPOFOL 10 MG/ML IV BOLUS
INTRAVENOUS | Status: DC | PRN
  Administered 2016-01-30: 50 mg via INTRAVENOUS
  Administered 2016-01-30: 150 mg via INTRAVENOUS

## 2016-01-30 MED ORDER — BUPIVACAINE HCL (PF) 0.25 % IJ SOLN
INTRAMUSCULAR | Status: AC
  Filled 2016-01-30: qty 30

## 2016-01-30 MED ORDER — HYDROMORPHONE HCL 1 MG/ML IJ SOLN
INTRAMUSCULAR | Status: AC
  Filled 2016-01-30: qty 1

## 2016-01-30 MED ORDER — HEMOSTATIC AGENTS (NO CHARGE) OPTIME
TOPICAL | Status: DC | PRN
  Administered 2016-01-30: 1 via TOPICAL

## 2016-01-30 MED ORDER — HYDROMORPHONE HCL 2 MG/ML IJ SOLN
0.5000 mg | INTRAMUSCULAR | Status: DC | PRN
  Administered 2016-01-31: 0.5 mg via INTRAVENOUS
  Filled 2016-01-30: qty 1

## 2016-01-30 MED ORDER — MIDAZOLAM HCL 5 MG/5ML IJ SOLN
INTRAMUSCULAR | Status: DC | PRN
  Administered 2016-01-30 (×2): 1 mg via INTRAVENOUS

## 2016-01-30 MED ORDER — MIDAZOLAM HCL 2 MG/2ML IJ SOLN
INTRAMUSCULAR | Status: AC
  Filled 2016-01-30: qty 2

## 2016-01-30 MED ORDER — 0.9 % SODIUM CHLORIDE (POUR BTL) OPTIME
TOPICAL | Status: DC | PRN
  Administered 2016-01-30: 1000 mL

## 2016-01-30 MED ORDER — SODIUM CHLORIDE 0.9% FLUSH: 3.0000 mL | INTRAVENOUS | Status: DC | PRN

## 2016-01-30 MED ORDER — LIDOCAINE HCL (PF) 2 % IJ SOLN
INTRAMUSCULAR | Status: DC | PRN
  Administered 2016-01-30: 60 mg via INTRADERMAL

## 2016-01-30 MED ORDER — FENTANYL CITRATE (PF) 100 MCG/2ML IJ SOLN
INTRAMUSCULAR | Status: AC
  Filled 2016-01-30: qty 2

## 2016-01-30 MED ORDER — LACTATED RINGERS IV SOLN
INTRAVENOUS | Status: DC
  Administered 2016-01-30 (×2): via INTRAVENOUS

## 2016-01-30 MED ORDER — SUGAMMADEX SODIUM 200 MG/2ML IV SOLN
INTRAVENOUS | Status: DC | PRN
  Administered 2016-01-30: 170 mg via INTRAVENOUS

## 2016-01-30 MED ORDER — PANTOPRAZOLE SODIUM 40 MG PO TBEC
40.0000 mg | DELAYED_RELEASE_TABLET | Freq: Every day | ORAL | Status: DC
  Administered 2016-01-31: 40 mg via ORAL
  Filled 2016-01-30: qty 1

## 2016-01-30 MED ORDER — GLYCOPYRROLATE 0.2 MG/ML IJ SOLN
INTRAMUSCULAR | Status: DC | PRN
  Administered 2016-01-30 (×2): 0.1 mg via INTRAVENOUS

## 2016-01-30 MED ORDER — ONDANSETRON HCL 4 MG/2ML IJ SOLN
INTRAMUSCULAR | Status: AC
  Filled 2016-01-30: qty 2

## 2016-01-30 MED ORDER — OXYCODONE HCL 5 MG PO TABS
ORAL_TABLET | ORAL | Status: DC
Start: 2016-01-30 — End: 2016-01-30
  Filled 2016-01-30: qty 2

## 2016-01-30 MED ORDER — ROCURONIUM BROMIDE 100 MG/10ML IV SOLN
INTRAVENOUS | Status: DC | PRN
  Administered 2016-01-30: 50 mg via INTRAVENOUS

## 2016-01-30 MED ORDER — SUCCINYLCHOLINE CHLORIDE 200 MG/10ML IV SOSY
PREFILLED_SYRINGE | INTRAVENOUS | Status: AC
  Filled 2016-01-30: qty 10

## 2016-01-30 MED ORDER — FLUTICASONE PROPIONATE 50 MCG/ACT NA SUSP: 2.0000 | Freq: Every day | NASAL | Status: DC

## 2016-01-30 MED ORDER — SODIUM CHLORIDE 0.9 % IV SOLN
250.0000 mL | INTRAVENOUS | Status: DC
  Administered 2016-01-30: 250 mL via INTRAVENOUS

## 2016-01-30 MED ORDER — SODIUM CHLORIDE 0.9 % IV SOLN
INTRAVENOUS | Status: DC
  Administered 2016-01-31: 05:00:00 via INTRAVENOUS

## 2016-01-30 MED ORDER — HYDROMORPHONE HCL 1 MG/ML IJ SOLN
0.2500 mg | INTRAMUSCULAR | Status: DC | PRN
  Administered 2016-01-30 (×4): 0.5 mg via INTRAVENOUS

## 2016-01-30 MED ORDER — ONDANSETRON HCL 4 MG/2ML IJ SOLN: 4.0000 mg | INTRAMUSCULAR | Status: DC | PRN

## 2016-01-30 MED ORDER — SODIUM CHLORIDE 0.9% FLUSH
3.0000 mL | Freq: Two times a day (BID) | INTRAVENOUS | Status: DC
  Administered 2016-01-31: 3 mL via INTRAVENOUS

## 2016-01-30 MED ORDER — CEFAZOLIN SODIUM-DEXTROSE 2-4 GM/100ML-% IV SOLN
2.0000 g | INTRAVENOUS | Status: AC
  Administered 2016-01-30: 2 g via INTRAVENOUS
  Filled 2016-01-30: qty 100

## 2016-01-30 MED ORDER — LIDOCAINE 2% (20 MG/ML) 5 ML SYRINGE
INTRAMUSCULAR | Status: AC
  Filled 2016-01-30: qty 5

## 2016-01-30 MED ORDER — PHENOL 1.4 % MT LIQD: 1.0000 | OROMUCOSAL | Status: DC | PRN

## 2016-01-30 MED ORDER — THROMBIN 20000 UNITS EX SOLR
CUTANEOUS | Status: AC
  Filled 2016-01-30: qty 20000

## 2016-01-30 MED ORDER — OXYCODONE-ACETAMINOPHEN 5-325 MG PO TABS
ORAL_TABLET | ORAL | Status: AC
  Filled 2016-01-30: qty 2

## 2016-01-30 MED ORDER — CHLORHEXIDINE GLUCONATE 4 % EX LIQD: 60.0000 mL | Freq: Once | CUTANEOUS | Status: DC

## 2016-01-30 MED ORDER — METHOCARBAMOL 500 MG PO TABS
500.0000 mg | ORAL_TABLET | Freq: Four times a day (QID) | ORAL | Status: DC | PRN
  Administered 2016-01-30: 500 mg via ORAL

## 2016-01-30 MED ORDER — ONDANSETRON HCL 4 MG/2ML IJ SOLN
INTRAMUSCULAR | Status: DC | PRN
  Administered 2016-01-30: 4 mg via INTRAVENOUS

## 2016-01-30 MED ORDER — CEFAZOLIN IN D5W 1 GM/50ML IV SOLN
1.0000 g | Freq: Three times a day (TID) | INTRAVENOUS | Status: AC
  Administered 2016-01-30 – 2016-01-31 (×2): 1 g via INTRAVENOUS
  Filled 2016-01-30 (×2): qty 50

## 2016-01-30 MED ORDER — ROCURONIUM BROMIDE 10 MG/ML (PF) SYRINGE
PREFILLED_SYRINGE | INTRAVENOUS | Status: AC
  Filled 2016-01-30: qty 10

## 2016-01-30 MED ORDER — PROPOFOL 10 MG/ML IV BOLUS
INTRAVENOUS | Status: AC
  Filled 2016-01-30: qty 20

## 2016-01-30 MED ORDER — METHOCARBAMOL 1000 MG/10ML IJ SOLN
500.0000 mg | Freq: Four times a day (QID) | INTRAVENOUS | Status: DC | PRN
  Filled 2016-01-30: qty 5

## 2016-01-30 SURGICAL SUPPLY — 54 items
APL SKNCLS STERI-STRIP NONHPOA (GAUZE/BANDAGES/DRESSINGS) ×1
BENZOIN TINCTURE PRP APPL 2/3 (GAUZE/BANDAGES/DRESSINGS) ×2 IMPLANT
BIT DRILL SRG 14X2.2XFLT CHK (BIT) IMPLANT
BIT DRL SRG 14X2.2XFLT CHK (BIT) ×1
BLADE SURG ROTATE 9660 (MISCELLANEOUS) IMPLANT
BONE CERV LORDOTIC 14.5X12X8 (Bone Implant) ×2 IMPLANT
BUR ROUND FLUTED 4 SOFT TCH (BURR) ×1 IMPLANT
COLLAR CERV LO CONTOUR FIRM DE (SOFTGOODS) ×2 IMPLANT
COVER MAYO STAND STRL (DRAPES) ×2 IMPLANT
COVER SURGICAL LIGHT HANDLE (MISCELLANEOUS) ×2 IMPLANT
CRADLE DONUT ADULT HEAD (MISCELLANEOUS) ×2 IMPLANT
DRAIN HEMOVAC 7FR (DRAIN) ×1 IMPLANT
DRAPE C-ARM 42X72 X-RAY (DRAPES) ×2 IMPLANT
DRAPE MICROSCOPE LEICA (MISCELLANEOUS) ×2 IMPLANT
DRAPE PROXIMA HALF (DRAPES) ×2 IMPLANT
DRILL BIT SKYLINE 14MM (BIT) ×2
DRSG MEPILEX BORDER 4X4 (GAUZE/BANDAGES/DRESSINGS) ×1 IMPLANT
DURAPREP 6ML APPLICATOR 50/CS (WOUND CARE) ×2 IMPLANT
ELECT COATED BLADE 2.86 ST (ELECTRODE) ×2 IMPLANT
ELECT REM PT RETURN 9FT ADLT (ELECTROSURGICAL) ×2
ELECTRODE REM PT RTRN 9FT ADLT (ELECTROSURGICAL) ×1 IMPLANT
EVACUATOR 1/8 PVC DRAIN (DRAIN) ×2 IMPLANT
GAUZE SPONGE 4X4 12PLY STRL (GAUZE/BANDAGES/DRESSINGS) ×2 IMPLANT
GLOVE BIOGEL PI IND STRL 8 (GLOVE) ×2 IMPLANT
GLOVE BIOGEL PI INDICATOR 8 (GLOVE) ×2
GLOVE ORTHO TXT STRL SZ7.5 (GLOVE) ×4 IMPLANT
GOWN STRL REUS W/ TWL LRG LVL3 (GOWN DISPOSABLE) ×1 IMPLANT
GOWN STRL REUS W/ TWL XL LVL3 (GOWN DISPOSABLE) ×1 IMPLANT
GOWN STRL REUS W/TWL 2XL LVL3 (GOWN DISPOSABLE) ×2 IMPLANT
GOWN STRL REUS W/TWL LRG LVL3 (GOWN DISPOSABLE) ×2
GOWN STRL REUS W/TWL XL LVL3 (GOWN DISPOSABLE) ×2
HEAD HALTER (SOFTGOODS) ×2 IMPLANT
KIT BASIN OR (CUSTOM PROCEDURE TRAY) ×2 IMPLANT
KIT ROOM TURNOVER OR (KITS) ×2 IMPLANT
MANIFOLD NEPTUNE II (INSTRUMENTS) ×1 IMPLANT
NDL 25GX 5/8IN NON SAFETY (NEEDLE) ×1 IMPLANT
NEEDLE 25GX 5/8IN NON SAFETY (NEEDLE) ×2 IMPLANT
NS IRRIG 1000ML POUR BTL (IV SOLUTION) ×2 IMPLANT
PACK ORTHO CERVICAL (CUSTOM PROCEDURE TRAY) ×2 IMPLANT
PAD ARMBOARD 7.5X6 YLW CONV (MISCELLANEOUS) ×4 IMPLANT
PATTIES SURGICAL .5 X.5 (GAUZE/BANDAGES/DRESSINGS) IMPLANT
PIN TEMP SKYLINE THREADED (PIN) ×1 IMPLANT
PLATE ONE LEVEL SKYLINE 14MM (Plate) ×2 IMPLANT
SCREW VARIABLE SELF TAP 12MM (Screw) ×4 IMPLANT
STRIP CLOSURE SKIN 1/2X4 (GAUZE/BANDAGES/DRESSINGS) ×2 IMPLANT
SURGIFLO W/THROMBIN 8M KIT (HEMOSTASIS) IMPLANT
SUT BONE WAX W31G (SUTURE) ×2 IMPLANT
SUT VIC AB 3-0 PS2 18 (SUTURE) ×2
SUT VIC AB 3-0 PS2 18XBRD (SUTURE) ×1 IMPLANT
SUT VIC AB 4-0 PS2 27 (SUTURE) ×2 IMPLANT
SYR BULB 3OZ (MISCELLANEOUS) ×2 IMPLANT
TOWEL OR 17X24 6PK STRL BLUE (TOWEL DISPOSABLE) ×2 IMPLANT
TOWEL OR 17X26 10 PK STRL BLUE (TOWEL DISPOSABLE) ×2 IMPLANT
WATER STERILE IRR 1000ML POUR (IV SOLUTION) ×2 IMPLANT

## 2016-01-30 NOTE — Anesthesia Preprocedure Evaluation (Addendum)
Anesthesia Evaluation  Patient identified by MRN, date of birth, ID band Patient awake    Reviewed: Allergy & Precautions, H&P , NPO status , Patient's Chart, lab work & pertinent test results  Airway Mallampati: II  TM Distance: >3 FB Neck ROM: Full    Dental no notable dental hx. (+) Teeth Intact, Dental Advisory Given   Pulmonary neg pulmonary ROS, former smoker,    Pulmonary exam normal breath sounds clear to auscultation       Cardiovascular negative cardio ROS   Rhythm:Regular Rate:Normal     Neuro/Psych negative neurological ROS  negative psych ROS   GI/Hepatic Neg liver ROS, GERD  Medicated and Controlled,  Endo/Other  negative endocrine ROS  Renal/GU negative Renal ROS  negative genitourinary   Musculoskeletal  (+) Arthritis , Osteoarthritis,    Abdominal   Peds  Hematology negative hematology ROS (+)   Anesthesia Other Findings   Reproductive/Obstetrics negative OB ROS                            Anesthesia Physical Anesthesia Plan  ASA: II  Anesthesia Plan: General   Post-op Pain Management:    Induction: Intravenous  Airway Management Planned: Oral ETT  Additional Equipment:   Intra-op Plan:   Post-operative Plan: Extubation in OR  Informed Consent: I have reviewed the patients History and Physical, chart, labs and discussed the procedure including the risks, benefits and alternatives for the proposed anesthesia with the patient or authorized representative who has indicated his/her understanding and acceptance.   Dental advisory given  Plan Discussed with: CRNA  Anesthesia Plan Comments:        Anesthesia Quick Evaluation

## 2016-01-30 NOTE — Op Note (Signed)
Preop diagnosis :C5-6 cervical spondylosis with stenosis  Postop diagnosis: Same  Procedure: C5-6 anterior cervical discectomy and fusion allograft and plate  Surgeon: Rodell Perna M.D.  Assistant: Benjiman Core PA-C medically necessary and present for the entire procedure  Anesthesia Gen.  Drains: One Hemovac neck.  Procedure: After induction general anesthesia arms tucked to the side had halter traction without weight yellow head ring prepping and draping with DuraPrep area squared with towels Betadine Steri-Drape sterile Mayo stand at the head and thyroid sheets and drapes were applied. Incision started at the midline extending to the left. Timeout procedure was performed for incision and preoperative antibiotics Ancef were given. Platysma was divided in line with the skin incision. Blunt dissection down to the prominent spurs at C5-C6 C6-7. Short 25 needle was placed in the disc at C5-6 confirmed with the lateral C-arm after it was draped with a sterile cover. C-arm was removed and suffocating retractors were placed underneath the longus Coley muscle right and left. Anterior spurs C5-6 were removed with the run sure operative microscope was draped brought in disc was taken down using Clark curettes pituitaries 4 mm bur posterior spurs were removed. Spurs on 5 overhanging down of within a millimeter touching the endplate at C6. Once these were decompress the dura was completely visualized ligaments were removed and the dura was bulging and between the 2 vertebrae. Some Surgifoam was placed for a little bit of epidural oozing. Once it was dry and was sized and a millimeter graft was placed based on the trial sizers. CRNA pulled on traction and the graft was countersunk 2 mm was stable and 20mm Depew plate was selected. Tiny screwdriver was used to lock in the screws after initial positioning checking under C-arm ingestion of the plate then replacement the screws checking again and then locking the screws  in. Hemovac was placed in and out technique platysma closed with 3-0 Vicryl for Vicryl subcuticular closure tincture benzoin Steri-Strips postop dressing soft collar and transferred recovery room.

## 2016-01-30 NOTE — Interval H&P Note (Signed)
History and Physical Interval Note:  01/30/2016 12:59 PM  Patricia Hodges  has presented today for surgery, with the diagnosis of C5-6 Spondylosis, Stenosis  The various methods of treatment have been discussed with the patient and family. After consideration of risks, benefits and other options for treatment, the patient has consented to  Procedure(s): C5-6 Anterior Cervical Discectomy and Fusion, Allograft, Plate (N/A) as a surgical intervention .  The patient's history has been reviewed, patient examined, no change in status, stable for surgery.  I have reviewed the patient's chart and labs.  Questions were answered to the patient's satisfaction.     [name]

## 2016-01-30 NOTE — Brief Op Note (Signed)
01/30/2016  3:26 PM  PATIENT:  Patricia Hodges  70 y.o. female  PRE-OPERATIVE DIAGNOSIS:  C5-6 Spondylosis, Stenosis  POST-OPERATIVE DIAGNOSIS:  C5-6 Spondylosis, Stenosis  PROCEDURE:  Procedure(s): C5-6 Anterior Cervical Discectomy and Fusion, Allograft, Plate (N/A)  SURGEON:  Surgeon(s) and Role:    * [name], MD - Primary  PHYSICIAN ASSISTANT: Benjiman Core pa-c    ANESTHESIA:   general  EBL:  Total I/O In: 1000 [I.V.:1000] Out: 400 [Blood:400]  BLOOD ADMINISTERED:none  DRAINS: hemovac  LOCAL MEDICATIONS USED:  MARCAINE     SPECIMEN:  No Specimen  DISPOSITION OF SPECIMEN:  N/A  COUNTS: yes      TOURNIQUET:  * No tourniquets in log *  DICTATION: .Dragon Dictation  PLAN OF CARE: Admit for overnight observation  PATIENT DISPOSITION:  PACU - hemodynamically stable.

## 2016-01-30 NOTE — Anesthesia Procedure Notes (Signed)
Procedure Name: Intubation Date/Time: 01/30/2016 2:05 PM Performed by: Adalberto Ill Pre-anesthesia Checklist: Patient identified, Emergency Drugs available, Suction available, Patient being monitored and Timeout performed Patient Re-evaluated:Patient Re-evaluated prior to inductionOxygen Delivery Method: Circle system utilized Preoxygenation: Pre-oxygenation with 100% oxygen Intubation Type: IV induction Ventilation: Mask ventilation without difficulty Laryngoscope Size: Miller and 2 Grade View: Grade I Tube type: Oral Tube size: 7.0 mm Number of attempts: 2 (1st attempt by [facility]) Airway Equipment and Method: Stylet Placement Confirmation: ETT inserted through vocal cords under direct vision,  positive ETCO2,  CO2 detector and breath sounds checked- equal and bilateral Secured at: 20 cm Tube secured with: Tape Dental Injury: Teeth and Oropharynx as per pre-operative assessment

## 2016-01-30 NOTE — Transfer of Care (Signed)
Immediate Anesthesia Transfer of Care Note  Patient: Patricia Hodges  Procedure(s) Performed: Procedure(s): C5-6 Anterior Cervical Discectomy and Fusion, Allograft, Plate (N/A)  Patient Location: PACU  Anesthesia Type:General  Level of Consciousness: awake, alert  and patient cooperative  Airway & Oxygen Therapy: Patient Spontanous Breathing  Post-op Assessment: Report given to RN and Post -op Vital signs reviewed and stable  Post vital signs: Reviewed and stable  Last Vitals:  Vitals:   01/30/16 0931  BP: (!) 161/85  Pulse: 62  Resp: 18  Temp: 36.9 C    Last Pain:  Vitals:   01/30/16 1000  TempSrc:   PainSc: 7       Patients Stated Pain Goal: 5 ([id number])  Complications: No apparent anesthesia complications

## 2016-01-30 NOTE — Anesthesia Postprocedure Evaluation (Signed)
Anesthesia Post Note  Patient: JEILYN SLEDGE  Procedure(s) Performed: Procedure(s) (LRB): C5-6 Anterior Cervical Discectomy and Fusion, Allograft, Plate (N/A)  Patient location during evaluation: PACU Anesthesia Type: General Level of consciousness: awake and alert, oriented and patient cooperative Pain management: pain level controlled Vital Signs Assessment: post-procedure vital signs reviewed and stable Respiratory status: spontaneous breathing, nonlabored ventilation, respiratory function stable and patient connected to nasal cannula oxygen Cardiovascular status: blood pressure returned to baseline and stable Postop Assessment: no signs of nausea or vomiting Anesthetic complications: no    Last Vitals:  Vitals:   01/30/16 1715 01/30/16 1742  BP: (!) 159/99 (!) 154/74  Pulse: (!) 59 64  Resp: 18   Temp:  36.9 C    Last Pain:  Vitals:   01/30/16 1742  TempSrc: Oral  PainSc:                  Keiffer Piper,E. Rajeev Escue

## 2016-01-30 NOTE — H&P (Signed)
Patricia Hodges is an 70 y.o. female.   Chief Complaint: neck pain, arm pain HPI:  Patient presents with above complaint.  Failed conservative treatment.  Mri C5-6 stenosis.   Past Medical History:  Diagnosis Date  . Diverticulosis   . Dysphagia   . Esophageal reflux   . Gastroenteritis, eosinophilic   . H pylori ulcer   . High cholesterol   . Osteoarthritis   . S/P colonoscopy 2006   diverticulosis, due for repeat in 2016  . S/P endoscopy 2007   Schatzki's Ring    Past Surgical History:  Procedure Laterality Date  . bladder tack    . BUNIONECTOMY Right   . CARPAL TUNNEL RELEASE Left   . CATARACT EXTRACTION W/PHACO Right 11/28/2015   Procedure: CATARACT EXTRACTION PHACO AND INTRAOCULAR LENS PLACEMENT ([place]) RIGHT;  Surgeon: Tonny Branch, MD;  Location: [facility];  Service: Ophthalmology;  Laterality: Right;  CDE: 6.96  . CATARACT EXTRACTION W/PHACO Left 12/15/2015   Procedure: CATARACT EXTRACTION PHACO AND INTRAOCULAR LENS PLACEMENT LEFT EYE CDE=9.04;  Surgeon: Tonny Branch, MD;  Location: [facility];  Service: Ophthalmology;  Laterality: Left;  left  . CESAREAN SECTION    . CHOLECYSTECTOMY    . COLONOSCOPY  2006  . COLONOSCOPY N/A 09/08/2014   Procedure: COLONOSCOPY;  Surgeon: Daneil Dolin, MD;  [facility];  Service: Endoscopy;  Laterality: N/A;  10:30 AM  . feet surgery    . HAND SURGERY Right    on ring finger due to swollen knuckle  . Hx schatski's ring    . S/P Hysterectomy      Family History  Problem Relation Age of Onset  . Colon cancer Neg Hx    Social History:  reports that she quit smoking about 37 years ago. Her smoking use included Cigarettes. She has a 20.00 pack-year smoking history. She has never used smokeless tobacco. She reports that she does not drink alcohol or use drugs.  Allergies:  Allergies  Allergen Reactions  . Alendronate Sodium Other (See Comments)    Muscle pain   . Aspirin Nausea Only and Other (See Comments)    Abdominal Pain   . Celecoxib Nausea Only    Abdominal Pain  . Ezetimibe-Simvastatin Other (See Comments)    Muscle pain  . Rofecoxib     REACTION: Unknown reaction    Medications Prior to Admission  Medication Sig Dispense Refill  . aspirin EC 81 MG tablet Take 81 mg by mouth daily.    Marland Kitchen CALCIUM-VITAMIN D PO Take 1 tablet by mouth daily.    Marland Kitchen FLUoxetine (PROZAC) 10 MG tablet Take 10 mg by mouth daily.    . fluticasone (FLONASE) 50 MCG/ACT nasal spray Place 2 sprays into both nostrils daily.     Marland Kitchen HYDROcodone-acetaminophen (NORCO/VICODIN) 5-325 MG tablet Take 1 tablet by mouth 2 (two) times daily as needed for moderate pain.    Marland Kitchen omeprazole (PRILOSEC) 20 MG capsule Take 20 mg by mouth daily.      Marland Kitchen ibuprofen (ADVIL,MOTRIN) 200 MG tablet Take 200 mg by mouth every 6 (six) hours as needed for mild pain.       No results found for this or any previous visit (from the past 48 hour(s)). No results found.  Review of Systems  Constitutional: Negative.   Respiratory: Negative.   Cardiovascular: Negative.   Gastrointestinal: Negative.   Genitourinary: Negative.   Musculoskeletal: Positive for neck pain.  Skin: Negative.   Psychiatric/Behavioral: Negative.  Blood pressure (!) 161/85, pulse 62, temperature 98.4 F (36.9 C), temperature source Oral, resp. rate 18, height 5\' 4"  (1.626 m), weight 177 lb (80.3 kg), SpO2 98 %. Physical Exam  Constitutional: She is oriented to person, place, and time. No distress.  HENT:  Head: Normocephalic and atraumatic.  Eyes: EOM are normal. Pupils are equal, round, and reactive to light.  Respiratory: Effort normal.  GI: She exhibits no distension.  Musculoskeletal: She exhibits tenderness.  Neurological: She is alert and oriented to person, place, and time.  Skin: Skin is warm and dry.  Psychiatric: She has a normal mood and affect.    CLINICAL DATA:  Cervical radiculopathy  EXAM: MRI CERVICAL SPINE WITHOUT CONTRAST  TECHNIQUE: Multiplanar,  multisequence MR imaging of the cervical spine was performed. No intravenous contrast was administered.  COMPARISON:  Cervical spine radiographs 05/05/2014. No prior MRI for comparison  FINDINGS: Alignment: Mild anterior slip C6-7. Straightening of the cervical lordosis.  Vertebrae: Negative for fracture or mass.  Normal bone marrow.  Cord: Normal spinal cord  Posterior Fossa, vertebral arteries, paraspinal tissues: Negative  Disc levels:  C2-3:  Negative  C3-4: Mild left foraminal narrowing due to uncinate spurring and mild facet degeneration. No cord deformity.  C4-5: Mild disc bulging without significant spinal or foraminal stenosis  C5-6: Disc degeneration and spondylosis. Uncinate spurring right greater than left. There is foraminal narrowing right greater than left and mild spinal stenosis.  C6-7: Disc degeneration with mild spurring. No significant spinal stenosis  C7-T1:  Negative  IMPRESSION: Mild cervical spine degenerative change. Mild spinal stenosis at C5-6 due to spurring. Right greater than left foraminal stenosis at C5-6 due to spurring.  Assessment/Plan C5-6 stenosis/spondylosis Will proceed with C5-6 ACDF as scheduled.  Procedure along with possible risks and complications discussed.  All questions answered and wishes to proceed.   Benjiman Core, PA-C 01/30/2016, 12:49 PM

## 2016-01-31 DIAGNOSIS — M4722 Other spondylosis with radiculopathy, cervical region: Secondary | ICD-10-CM | POA: Diagnosis not present

## 2016-01-31 MED ORDER — OXYCODONE-ACETAMINOPHEN 5-325 MG PO TABS: 1.0000 | ORAL_TABLET | Freq: Four times a day (QID) | ORAL | 0 refills | Status: DC | PRN

## 2016-01-31 NOTE — Progress Notes (Signed)
Subjective: 1 Day Post-Op Procedure(s) (LRB): C5-6 Anterior Cervical Discectomy and Fusion, Allograft, Plate (N/A) Patient reports pain as mild.    Objective: Vital signs in last 24 hours: Temp:  [97.5 F (36.4 C)-98.5 F (36.9 C)] 98 F (36.7 C) (11/14 0500) Pulse Rate:  [52-81] 52 (11/14 0500) Resp:  [12-18] 16 (11/14 0500) BP: (124-161)/(59-99) 130/67 (11/14 0500) SpO2:  [94 %-100 %] 95 % (11/14 0500) Weight:  [177 lb (80.3 kg)] 177 lb (80.3 kg) (11/13 0931)  Intake/Output from previous day: 11/13 0701 - 11/14 0700 In: 1800 [P.O.:150; I.V.:1650] Out: 400 [Blood:400] Intake/Output this shift: No intake/output data recorded.  No results for input(s): HGB in the last 72 hours. No results for input(s): WBC, RBC, HCT, PLT in the last 72 hours. No results for input(s): NA, K, CL, CO2, BUN, CREATININE, GLUCOSE, CALCIUM in the last 72 hours. No results for input(s): LABPT, INR in the last 72 hours.  Neurologically intact. Drain removed.   Assessment/Plan: 1 Day Post-Op Procedure(s) (LRB): C5-6 Anterior Cervical Discectomy and Fusion, Allograft, Plate (N/A) Plan:discharge home.   Patricia Hodges 01/31/2016, 7:37 AM

## 2016-01-31 NOTE — Progress Notes (Signed)
Review discharge papers and medications with full understanding. Ordered second soft collar per Dr Lorin Mercy orders. Pt is resting with out complains.

## 2016-01-31 NOTE — Progress Notes (Signed)
Orthopedic Tech Progress Note Patient Details:  Patricia Hodges 20-Jun-1945 [id number]  Ortho Devices Type of Ortho Device: Soft collar Ortho Device/Splint Interventions: Application   [name] 01/31/2016, 8:42 AM

## 2016-02-02 ENCOUNTER — Encounter (HOSPITAL_COMMUNITY): Payer: Self-pay | Admitting: Orthopaedic Surgery

## 2016-02-07 ENCOUNTER — Encounter (INDEPENDENT_AMBULATORY_CARE_PROVIDER_SITE_OTHER): Payer: Self-pay | Admitting: Orthopaedic Surgery

## 2016-02-07 ENCOUNTER — Ambulatory Visit (INDEPENDENT_AMBULATORY_CARE_PROVIDER_SITE_OTHER): Payer: Medicare HMO

## 2016-02-07 ENCOUNTER — Ambulatory Visit (INDEPENDENT_AMBULATORY_CARE_PROVIDER_SITE_OTHER): Payer: Medicare HMO | Admitting: Orthopaedic Surgery

## 2016-02-07 VITALS — BP 141/84 | HR 62 | Ht 64.0 in | Wt 177.0 lb

## 2016-02-07 DIAGNOSIS — M4802 Spinal stenosis, cervical region: Secondary | ICD-10-CM | POA: Diagnosis not present

## 2016-02-07 NOTE — Discharge Summary (Signed)
Patient ID: Patricia Hodges MRN: [id number] DOB/AGE: June 06, 1945 70 y.o.  Admit date: 01/30/2016 Discharge date: 02/07/2016  Admission Diagnoses:  Active Problems:   Cervical spinal stenosis   Surgery, elective   Discharge Diagnoses:  Active Problems:   Cervical spinal stenosis   Surgery, elective  status post Procedure(s): C5-6 Anterior Cervical Discectomy and Fusion, Allograft, Plate  Past Medical History:  Diagnosis Date  . Diverticulosis   . Dysphagia   . Esophageal reflux   . Gastroenteritis, eosinophilic   . H pylori ulcer   . High cholesterol   . Osteoarthritis   . S/P colonoscopy 2006   diverticulosis, due for repeat in 2016  . S/P endoscopy 2007   Schatzki's Ring    Surgeries: Procedure(s): C5-6 Anterior Cervical Discectomy and Fusion, Allograft, Plate on 579FGE   Consultants:   Discharged Condition: Improved  Hospital Course: Patricia Hodges is an 70 y.o. female who was admitted 01/30/2016 for operative treatment of cervical stenosis. Patient failed conservative treatments (please see the history and physical for the specifics) and had severe unremitting pain that affects sleep, daily activities and work/hobbies. After pre-op clearance, the patient was taken to the operating room on 01/30/2016 and underwent  Procedure(s): C5-6 Anterior Cervical Discectomy and Fusion, Allograft, Plate.    Patient was given perioperative antibiotics:  Anti-infectives    Start     Dose/Rate Route Frequency Ordered Stop   01/30/16 2200  ceFAZolin (ANCEF) IVPB 1 g/50 mL premix     1 g 100 mL/hr over 30 Minutes Intravenous Every 8 hours 01/30/16 1729 01/31/16 0547   01/30/16 0936  ceFAZolin (ANCEF) IVPB 2g/100 mL premix     2 g 200 mL/hr over 30 Minutes Intravenous On call to O.R. 01/30/16 0936 01/30/16 1401       Patient was given sequential compression devices and early ambulation to prevent DVT.   Patient benefited maximally from hospital stay and  there were no complications. At the time of discharge, the patient was urinating/moving their bowels without difficulty, tolerating a regular diet, pain is controlled with oral pain medications and they have been cleared by PT/OT.   Recent vital signs: No data found.    Recent laboratory studies: No results for input(s): WBC, HGB, HCT, PLT, NA, K, CL, CO2, BUN, CREATININE, GLUCOSE, INR, CALCIUM in the last 72 hours.  Invalid input(s): PT, 2   Discharge Medications:     Medication List    STOP taking these medications   HYDROcodone-acetaminophen 5-325 MG tablet Commonly known as:  NORCO/VICODIN   ibuprofen 200 MG tablet Commonly known as:  ADVIL,MOTRIN     TAKE these medications   aspirin EC 81 MG tablet Take 81 mg by mouth daily.   CALCIUM-VITAMIN D PO Take 1 tablet by mouth daily.   FLUoxetine 10 MG tablet Commonly known as:  PROZAC Take 10 mg by mouth daily.   fluticasone 50 MCG/ACT nasal spray Commonly known as:  FLONASE Place 2 sprays into both nostrils daily.   omeprazole 20 MG capsule Commonly known as:  PRILOSEC Take 20 mg by mouth daily.   oxyCODONE-acetaminophen 5-325 MG tablet Commonly known as:  PERCOCET/ROXICET Take 1-2 tablets by mouth every 6 (six) hours as needed for moderate pain.       Diagnostic Studies: Dg Chest 2 View  Result Date: 01/27/2016 CLINICAL DATA:  Preoperative chest x-ray EXAM: CHEST  2 VIEW COMPARISON:  PA and lateral chest x-ray dated October 01, 2013 FINDINGS: The lungs are adequately inflated  and clear. The heart and pulmonary vascularity are normal. The mediastinum is normal in width. There is no pleural effusion. There is calcification in the wall of the aortic arch. The bony thorax exhibits no acute abnormality. IMPRESSION: Mild hyperinflation consistent with COPD. There is no pneumonia, CHF, nor other acute cardiopulmonary abnormality. Aortic atherosclerosis. Electronically Signed   By: David  Martinique M.D.   On: 01/27/2016 13:51    Dg Cervical Spine 2-3 Views  Result Date: 01/30/2016 CLINICAL DATA:  Surgery:  C5-C6 ACDF. FLUOROSCOPY TIME:  7 seconds. EXAM: DG C-ARM 61-120 MIN; CERVICAL SPINE - 2-3 VIEW COMPARISON:  MRI 12/14/2015 FINDINGS: Frontal and lateral projections demonstrate anterior fusion at C5-6 with interbody bone plug. Endotracheal tube is in place anterior to the cervical spine. IMPRESSION: Anterior fusion C5-6. Electronically Signed   By: Nolon Nations M.D.   On: 01/30/2016 16:21   Dg C-arm 1-60 Min  Result Date: 01/30/2016 CLINICAL DATA:  Surgery:  C5-C6 ACDF. FLUOROSCOPY TIME:  7 seconds. EXAM: DG C-ARM 61-120 MIN; CERVICAL SPINE - 2-3 VIEW COMPARISON:  MRI 12/14/2015 FINDINGS: Frontal and lateral projections demonstrate anterior fusion at C5-6 with interbody bone plug. Endotracheal tube is in place anterior to the cervical spine. IMPRESSION: Anterior fusion C5-6. Electronically Signed   By: Nolon Nations M.D.   On: 01/30/2016 16:21   Xr Cervical Spine 2 Or 3 Views  Result Date: 02/07/2016 2 views cervical spine AP and lateral are reviewed. This shows postop C5-6 fusion good position of plate and screws. Impression: Satisfactory postop x-rays C5-6 fusion. Good position of graft plate and screws. She has mild spondylitic changes at C6-7 unchanged from previous x-ray   Discharge Instructions    Call MD / Call 911    Complete by:  As directed    If you experience chest pain or shortness of breath, CALL 911 and be transported to the hospital emergency room.  If you develope a fever above 101 F, pus (white drainage) or increased drainage or redness at the wound, or calf pain, call your surgeon's office.   Constipation Prevention    Complete by:  As directed    Drink plenty of fluids.  Prune juice may be helpful.  You may use a stool softener, such as Colace (over the counter) 100 mg twice a day.  Use MiraLax (over the counter) for constipation as needed.   Diet - low sodium heart healthy    Complete  by:  As directed    Discharge instructions    Complete by:  As directed    Ok to shower 5 days postop.  Do not apply any creams or ointments to incision.  Do not remove steri-strips.  Can use 4x4 gauze and tape for dressing changes.  No aggressive activity. Cervical collar must be on at all times except when showering.  Do not bend or turn neck.   Driving restrictions    Complete by:  As directed    No driving   Lifting restrictions    Complete by:  As directed    No lifting      Follow-up Information    [name], MD. Schedule an appointment as soon as possible for a visit in 1 week(s).   Specialty:  Orthopedic Surgery Why:  need to schedule return office visit one week postop Contact information: [place] [address] [phone]           Discharge Plan:  discharge to home  Disposition:  Signed: Benjiman Core for Rodell Perna MD [facility] orthopedics 02/07/2016, 4:59 PM

## 2016-02-07 NOTE — Progress Notes (Signed)
Post-Op Visit Note   Patient: Patricia Hodges           Date of Birth: [birth year]           MRN: [id number] Visit Date: 02/07/2016 PCP: [facility]   Assessment & Plan:  Chief Complaint:  Chief Complaint  Patient presents with  . Neck - Routine Post Op   Visit Diagnoses:  1. Spinal stenosis of cervical region   2. Cervical spinal stenosis    C5-6 fusion on 01/30/16 allograft and plate.  Plan: continue collar, ROV 5 wks for flexion/extension lateral c spine xrays out of collar.   Follow-Up Instructions: No Follow-up on file.   Orders:  Orders Placed This Encounter  Procedures  . XR Cervical Spine 2 or 3 views   No orders of the defined types were placed in this encounter.  Patient is here status post C5-6 ADC&F, Allograft, and Plate on 579FGE.  She is 8 days post op. She states that her neck feels very good. She does have complaints of left sided abdomen pain. It has hurt under her left breast and she has not hardly been able to eat anything.  She states that is better today.  She is taking Oxycodone for pain with relief. She states the Oxycodone is better, because the Hydrocodone makes her itch. She requests a refill. Steris changed, incision looks good.  PMFS History: Patient Active Problem List   Diagnosis Date Noted  . Cervical spinal stenosis 01/30/2016  . Surgery, elective   . History of colonic polyps   . ABDOMINAL BLOATING 11/11/2009  . HELICOBACTER PYLORI GASTRITIS 11/17/2007  . SCHATZKI'S RING 11/17/2007  . GERD 11/17/2007  . HIATAL HERNIA 11/17/2007  . EOSINOPHILIC GASTROENTERITIS 0000000  . OSTEOARTHRITIS 11/17/2007  . WEIGHT LOSS 11/17/2007  . NAUSEA 11/17/2007  . VOMITING 11/17/2007  . HEARTBURN 11/17/2007  . OTHER DYSPHAGIA 11/17/2007  . DIARRHEA 11/17/2007  . ABDOMINAL PAIN, HX OF 11/17/2007  . CHOLECYSTECTOMY, HX OF 11/17/2007   Past Medical History:  Diagnosis Date  . Diverticulosis   . Dysphagia   .  Esophageal reflux   . Gastroenteritis, eosinophilic   . H pylori ulcer   . High cholesterol   . Osteoarthritis   . S/P colonoscopy 2006   diverticulosis, due for repeat in 2016  . S/P endoscopy 2007   Schatzki's Ring    Family History  Problem Relation Age of Onset  . Colon cancer Neg Hx     Past Surgical History:  Procedure Laterality Date  . ANTERIOR CERVICAL DECOMP/DISCECTOMY FUSION N/A 01/30/2016   Procedure: C5-6 Anterior Cervical Discectomy and Fusion, Allograft, Plate;  Surgeon: [name], MD;  Location: [place];  Service: Orthopedics;  Laterality: N/A;  . bladder tack    . BUNIONECTOMY Right   . CARPAL TUNNEL RELEASE Left   . CATARACT EXTRACTION W/PHACO Right 11/28/2015   Procedure: CATARACT EXTRACTION PHACO AND INTRAOCULAR LENS PLACEMENT ([place]) RIGHT;  Surgeon: Tonny Branch, MD;  Location: [facility];  Service: Ophthalmology;  Laterality: Right;  CDE: 6.96  . CATARACT EXTRACTION W/PHACO Left 12/15/2015   Procedure: CATARACT EXTRACTION PHACO AND INTRAOCULAR LENS PLACEMENT LEFT EYE CDE=9.04;  Surgeon: Tonny Branch, MD;  Location: [facility];  Service: Ophthalmology;  Laterality: Left;  left  . CESAREAN SECTION    . CHOLECYSTECTOMY    . COLONOSCOPY  2006  . COLONOSCOPY N/A 09/08/2014   Procedure: COLONOSCOPY;  Surgeon: Daneil Dolin, MD;  [facility];  Service:  Endoscopy;  Laterality: N/A;  10:30 AM  . feet surgery    . HAND SURGERY Right    on ring finger due to swollen knuckle  . Hx schatski's ring    . S/P Hysterectomy     Social History   Occupational History  . Not on file.   Social History Main Topics  . Smoking status: Former Smoker    Packs/day: 1.00    Years: 20.00    Types: Cigarettes    Quit date: 11/23/1978  . Smokeless tobacco: Never Used     Comment: quit about 30 yrs ago  . Alcohol use No  . Drug use: No  . Sexual activity: Not on file

## 2016-03-05 ENCOUNTER — Ambulatory Visit (INDEPENDENT_AMBULATORY_CARE_PROVIDER_SITE_OTHER): Payer: Medicare HMO | Admitting: Otolaryngology

## 2016-03-08 ENCOUNTER — Ambulatory Visit (INDEPENDENT_AMBULATORY_CARE_PROVIDER_SITE_OTHER): Payer: Medicare HMO | Admitting: Orthopaedic Surgery

## 2016-03-08 ENCOUNTER — Ambulatory Visit (INDEPENDENT_AMBULATORY_CARE_PROVIDER_SITE_OTHER): Payer: Medicare HMO

## 2016-03-08 ENCOUNTER — Encounter (INDEPENDENT_AMBULATORY_CARE_PROVIDER_SITE_OTHER): Payer: Self-pay | Admitting: Orthopaedic Surgery

## 2016-03-08 VITALS — BP 154/80 | HR 59 | Ht 64.0 in | Wt 170.0 lb

## 2016-03-08 DIAGNOSIS — M4802 Spinal stenosis, cervical region: Secondary | ICD-10-CM

## 2016-03-08 NOTE — Progress Notes (Signed)
Post-Op Visit Note   Patient: Patricia Hodges           Date of Birth: 09-10-1945           MRN: [id number] Visit Date: 03/08/2016 PCP: [facility]   Assessment & Plan:  Chief Complaint:  Chief Complaint  Patient presents with  . Neck - Routine Post Op   Visit Diagnoses:  1. Cervical stenosis of spine           Postop cervical fusion now 6 weeks out with solid fusion good relief for preop symptoms.  Plan: His release from work activities. She can discontinue the collar she'll call if she has any problems.  Follow-Up Instructions: No Follow-up on file.   Orders:  Orders Placed This Encounter  Procedures  . XR Cervical Spine 2 or 3 views   No orders of the defined types were placed in this encounter.  HPI Patient returns status post C5-6 ACDF on 01/30/2016. She states that she is doing well and is not having any problems. She takes aleve as needed for pain. She continues to wear the collar.  PMFS History: Patient Active Problem List   Diagnosis Date Noted  . Cervical spinal stenosis 01/30/2016  . Surgery, elective   . History of colonic polyps   . ABDOMINAL BLOATING 11/11/2009  . HELICOBACTER PYLORI GASTRITIS 11/17/2007  . SCHATZKI'S RING 11/17/2007  . GERD 11/17/2007  . HIATAL HERNIA 11/17/2007  . EOSINOPHILIC GASTROENTERITIS 0000000  . OSTEOARTHRITIS 11/17/2007  . WEIGHT LOSS 11/17/2007  . NAUSEA 11/17/2007  . VOMITING 11/17/2007  . HEARTBURN 11/17/2007  . OTHER DYSPHAGIA 11/17/2007  . DIARRHEA 11/17/2007  . ABDOMINAL PAIN, HX OF 11/17/2007  . CHOLECYSTECTOMY, HX OF 11/17/2007   Past Medical History:  Diagnosis Date  . Diverticulosis   . Dysphagia   . Esophageal reflux   . Gastroenteritis, eosinophilic   . H pylori ulcer   . High cholesterol   . Osteoarthritis   . S/P colonoscopy 2006   diverticulosis, due for repeat in 2016  . S/P endoscopy 2007   Schatzki's Ring    Family History  Problem Relation Age of Onset  .  Colon cancer Neg Hx     Past Surgical History:  Procedure Laterality Date  . ANTERIOR CERVICAL DECOMP/DISCECTOMY FUSION N/A 01/30/2016   Procedure: C5-6 Anterior Cervical Discectomy and Fusion, Allograft, Plate;  Surgeon: [name], MD;  Location: [facility];  Service: Orthopedics;  Laterality: N/A;  . bladder tack    . BUNIONECTOMY Right   . CARPAL TUNNEL RELEASE Left   . CATARACT EXTRACTION W/PHACO Right 11/28/2015   Procedure: CATARACT EXTRACTION PHACO AND INTRAOCULAR LENS PLACEMENT ([place]) RIGHT;  Surgeon: Tonny Branch, MD;  Location: [facility];  Service: Ophthalmology;  Laterality: Right;  CDE: 6.96  . CATARACT EXTRACTION W/PHACO Left 12/15/2015   Procedure: CATARACT EXTRACTION PHACO AND INTRAOCULAR LENS PLACEMENT LEFT EYE CDE=9.04;  Surgeon: Tonny Branch, MD;  Location: [facility];  Service: Ophthalmology;  Laterality: Left;  left  . CESAREAN SECTION    . CHOLECYSTECTOMY    . COLONOSCOPY  2006  . COLONOSCOPY N/A 09/08/2014   Procedure: COLONOSCOPY;  Surgeon: Daneil Dolin, MD;  [facility];  Service: Endoscopy;  Laterality: N/A;  10:30 AM  . feet surgery    . HAND SURGERY Right    on ring finger due to swollen knuckle  . Hx schatski's ring    . S/P Hysterectomy     Social History  Occupational History  . Not on file.   Social History Main Topics  . Smoking status: Former Smoker    Packs/day: 1.00    Years: 20.00    Types: Cigarettes    Quit date: 11/23/1978  . Smokeless tobacco: Never Used     Comment: quit about 30 yrs ago  . Alcohol use No  . Drug use: No  . Sexual activity: Not on file

## 2016-03-15 ENCOUNTER — Ambulatory Visit (INDEPENDENT_AMBULATORY_CARE_PROVIDER_SITE_OTHER): Payer: Medicare HMO | Admitting: Orthopaedic Surgery

## 2016-03-29 ENCOUNTER — Ambulatory Visit (INDEPENDENT_AMBULATORY_CARE_PROVIDER_SITE_OTHER): Payer: Medicare HMO | Admitting: Otolaryngology

## 2016-03-29 DIAGNOSIS — H903 Sensorineural hearing loss, bilateral: Secondary | ICD-10-CM

## 2016-03-29 DIAGNOSIS — H9313 Tinnitus, bilateral: Secondary | ICD-10-CM

## 2016-03-29 DIAGNOSIS — H6983 Other specified disorders of Eustachian tube, bilateral: Secondary | ICD-10-CM | POA: Diagnosis not present

## 2016-03-30 ENCOUNTER — Other Ambulatory Visit (HOSPITAL_COMMUNITY): Payer: Self-pay | Admitting: Internal Medicine

## 2016-04-02 ENCOUNTER — Other Ambulatory Visit (HOSPITAL_COMMUNITY): Payer: Self-pay | Admitting: Family Medicine

## 2016-04-02 DIAGNOSIS — Z1231 Encounter for screening mammogram for malignant neoplasm of breast: Secondary | ICD-10-CM

## 2016-04-02 DIAGNOSIS — H5203 Hypermetropia, bilateral: Secondary | ICD-10-CM | POA: Diagnosis not present

## 2016-04-02 DIAGNOSIS — Z780 Asymptomatic menopausal state: Secondary | ICD-10-CM

## 2016-04-02 DIAGNOSIS — H26493 Other secondary cataract, bilateral: Secondary | ICD-10-CM | POA: Diagnosis not present

## 2016-04-02 DIAGNOSIS — H26492 Other secondary cataract, left eye: Secondary | ICD-10-CM | POA: Diagnosis not present

## 2016-04-02 DIAGNOSIS — H52223 Regular astigmatism, bilateral: Secondary | ICD-10-CM | POA: Diagnosis not present

## 2016-04-02 DIAGNOSIS — H26491 Other secondary cataract, right eye: Secondary | ICD-10-CM | POA: Diagnosis not present

## 2016-04-02 DIAGNOSIS — H35371 Puckering of macula, right eye: Secondary | ICD-10-CM | POA: Diagnosis not present

## 2016-04-11 ENCOUNTER — Ambulatory Visit (HOSPITAL_COMMUNITY)
Admission: RE | Admit: 2016-04-11 | Discharge: 2016-04-11 | Disposition: A | Discharge status: Home / Self Care | Payer: Medicare HMO | Source: Ambulatory Visit | Attending: Family Medicine | Admitting: Family Medicine

## 2016-04-11 DIAGNOSIS — Z1231 Encounter for screening mammogram for malignant neoplasm of breast: Secondary | ICD-10-CM | POA: Diagnosis present

## 2016-04-11 DIAGNOSIS — Z780 Asymptomatic menopausal state: Secondary | ICD-10-CM | POA: Diagnosis not present

## 2016-04-11 DIAGNOSIS — M818 Other osteoporosis without current pathological fracture: Secondary | ICD-10-CM | POA: Diagnosis not present

## 2016-04-11 DIAGNOSIS — M810 Age-related osteoporosis without current pathological fracture: Secondary | ICD-10-CM | POA: Diagnosis not present

## 2016-07-24 DIAGNOSIS — Z6830 Body mass index (BMI) 30.0-30.9, adult: Secondary | ICD-10-CM | POA: Diagnosis not present

## 2016-07-24 DIAGNOSIS — H9319 Tinnitus, unspecified ear: Secondary | ICD-10-CM | POA: Diagnosis not present

## 2016-07-24 DIAGNOSIS — E6609 Other obesity due to excess calories: Secondary | ICD-10-CM | POA: Diagnosis not present

## 2016-07-24 DIAGNOSIS — H6503 Acute serous otitis media, bilateral: Secondary | ICD-10-CM | POA: Diagnosis not present

## 2016-08-23 ENCOUNTER — Ambulatory Visit (INDEPENDENT_AMBULATORY_CARE_PROVIDER_SITE_OTHER): Payer: Medicare HMO

## 2016-08-23 ENCOUNTER — Ambulatory Visit (INDEPENDENT_AMBULATORY_CARE_PROVIDER_SITE_OTHER): Payer: Medicare HMO | Admitting: Orthopaedic Surgery

## 2016-08-23 ENCOUNTER — Encounter (INDEPENDENT_AMBULATORY_CARE_PROVIDER_SITE_OTHER): Payer: Self-pay | Admitting: Orthopaedic Surgery

## 2016-08-23 VITALS — BP 158/80 | HR 56 | Ht 64.0 in | Wt 165.0 lb

## 2016-08-23 DIAGNOSIS — G8929 Other chronic pain: Secondary | ICD-10-CM | POA: Diagnosis not present

## 2016-08-23 DIAGNOSIS — M545 Low back pain: Secondary | ICD-10-CM

## 2016-08-23 MED ORDER — DIAZEPAM 5 MG PO TABS: ORAL_TABLET | ORAL | 0 refills | Status: DC

## 2016-08-23 NOTE — Progress Notes (Signed)
Office Visit Note   Patient: Patricia Hodges           Date of Birth: 1945/12/23           MRN: [id number] Visit Date: 08/23/2016              Requested by: [place], [place] [address] [place], [place] [address] PCP: [facility]   Assessment & Plan: Visit Diagnoses:  1. Chronic bilateral low back pain, with sciatica presence unspecified   2.    Previous L1 compression fracture new findings on x-ray since 2010  Plan: We will proceed with lumbar MRI scan to evaluate her for right foraminal stenosis. No other lytic or sclerotic lesions are seen in the lumbar spine other than the right L5-S1 facet and the compression fracture. Negative history for malignancy. FOLLOW-up after new MRI scan.  Follow-Up Instructions: Follow-up after lumbar MRI  Orders:  Orders Placed This Encounter  Procedures  . XR Lumbar Spine 2-3 Views   No orders of the defined types were placed in this encounter.     Procedures: No procedures performed   Clinical Data: No additional findings.   Subjective: Chief Complaint  Patient presents with  . Lower Back - Pain    HPI 71 year old female with persistent problems with back pain and more pain in the right than left leg. She states she's had pain for years. Previous single level cervical fusion which is doing well 2017. She is used Aleve and Voltaren gel. She states she is having pain with walking. She denies fever chills no numbness or tingling in the left leg. Positive for numbness in the right leg.  Review of Systems 14 point review of systems updated and is unchanged from 02/07/2016 except for as mentioned in history of present illness as it pertains to her current symptoms.   Objective: Vital Signs: BP (!) 158/80   Pulse (!) 56   Ht 5\' 4"  (1.626 m)   Wt 165 lb (74.8 kg)   BMI 28.32 kg/m   Physical Exam  Constitutional: She is oriented to person, place, and time. She appears  well-developed.  HENT:  Head: Normocephalic.  Right Ear: External ear normal.  Left Ear: External ear normal.  Eyes: Pupils are equal, round, and reactive to light.  Neck: No tracheal deviation present. No thyromegaly present.  Cardiovascular: Normal rate.   Pulmonary/Chest: Effort normal.  Abdominal: Soft.  Neurological: She is alert and oriented to person, place, and time.  Skin: Skin is warm and dry.  Psychiatric: She has a normal mood and affect. Her behavior is normal.    Ortho Exam patient has good hip range of motion. Cervical incisions well-healed good cervical range of motion no brachial plexus tenderness. Upper extremity reflexes are 2+ and symmetrical. She has right sciatic notch tenderness. Some pain with straight leg raising 80. Knee and ankle jerk are intact. Minimal tenderness over the greater trochanter. Knees reach full extension. Anterior tib is intact.  Specialty Comments:  No specialty comments available.  Imaging: Xr Lumbar Spine 2-3 Views  Result Date: 08/23/2016 AP lateral lumbar spine demonstrates compression at L1 which was not present in 2010 x-rays for comparison. There is considerable facet hypertrophy on the right at L5-S1. No spondylolisthesis. Impression compression of L1 with loss of 30% height this does not appear acute. Right L5 facet hypertrophy.    PMFS History: Patient Active Problem List   Diagnosis Date Noted  . Cervical spinal stenosis  01/30/2016  . Surgery, elective   . History of colonic polyps   . ABDOMINAL BLOATING 11/11/2009  . HELICOBACTER PYLORI GASTRITIS 11/17/2007  . SCHATZKI'S RING 11/17/2007  . GERD 11/17/2007  . HIATAL HERNIA 11/17/2007  . EOSINOPHILIC GASTROENTERITIS 84/16/6063  . OSTEOARTHRITIS 11/17/2007  . WEIGHT LOSS 11/17/2007  . NAUSEA 11/17/2007  . VOMITING 11/17/2007  . HEARTBURN 11/17/2007  . OTHER DYSPHAGIA 11/17/2007  . DIARRHEA 11/17/2007  . ABDOMINAL PAIN, HX OF 11/17/2007  . CHOLECYSTECTOMY, HX OF  11/17/2007   Past Medical History:  Diagnosis Date  . Diverticulosis   . Dysphagia   . Esophageal reflux   . Gastroenteritis, eosinophilic   . H pylori ulcer   . High cholesterol   . Osteoarthritis   . S/P colonoscopy 2006   diverticulosis, due for repeat in 2016  . S/P endoscopy 2007   Schatzki's Ring    Family History  Problem Relation Age of Onset  . Colon cancer Neg Hx     Past Surgical History:  Procedure Laterality Date  . ANTERIOR CERVICAL DECOMP/DISCECTOMY FUSION N/A 01/30/2016   Procedure: C5-6 Anterior Cervical Discectomy and Fusion, Allograft, Plate;  Surgeon: [name], MD;  Location: [facility];  Service: Orthopedics;  Laterality: N/A;  . bladder tack    . BUNIONECTOMY Right   . CARPAL TUNNEL RELEASE Left   . CATARACT EXTRACTION W/PHACO Right 11/28/2015   Procedure: CATARACT EXTRACTION PHACO AND INTRAOCULAR LENS PLACEMENT ([place]) RIGHT;  Surgeon: Tonny Branch, MD;  Location: [facility];  Service: Ophthalmology;  Laterality: Right;  CDE: 6.96  . CATARACT EXTRACTION W/PHACO Left 12/15/2015   Procedure: CATARACT EXTRACTION PHACO AND INTRAOCULAR LENS PLACEMENT LEFT EYE CDE=9.04;  Surgeon: Tonny Branch, MD;  Location: [facility];  Service: Ophthalmology;  Laterality: Left;  left  . CESAREAN SECTION    . CHOLECYSTECTOMY    . COLONOSCOPY  2006  . COLONOSCOPY N/A 09/08/2014   Procedure: COLONOSCOPY;  Surgeon: Daneil Dolin, MD;  [facility];  Service: Endoscopy;  Laterality: N/A;  10:30 AM  . feet surgery    . HAND SURGERY Right    on ring finger due to swollen knuckle  . Hx schatski's ring    . S/P Hysterectomy     Social History   Occupational History  . Not on file.   Social History Main Topics  . Smoking status: Former Smoker    Packs/day: 1.00    Years: 20.00    Types: Cigarettes    Quit date: 11/23/1978  . Smokeless tobacco: Never Used     Comment: quit about 30 yrs ago  . Alcohol use No  . Drug use: No  . Sexual activity: Not on file

## 2016-08-29 ENCOUNTER — Ambulatory Visit (HOSPITAL_COMMUNITY)
Admission: RE | Admit: 2016-08-29 | Discharge: 2016-08-29 | Disposition: A | Discharge status: Home / Self Care | Payer: Medicare HMO | Source: Ambulatory Visit | Attending: Orthopaedic Surgery | Admitting: Orthopaedic Surgery

## 2016-08-29 DIAGNOSIS — M48061 Spinal stenosis, lumbar region without neurogenic claudication: Secondary | ICD-10-CM | POA: Insufficient documentation

## 2016-08-29 DIAGNOSIS — M47897 Other spondylosis, lumbosacral region: Secondary | ICD-10-CM | POA: Insufficient documentation

## 2016-08-29 DIAGNOSIS — G8929 Other chronic pain: Secondary | ICD-10-CM

## 2016-08-29 DIAGNOSIS — M4807 Spinal stenosis, lumbosacral region: Secondary | ICD-10-CM | POA: Insufficient documentation

## 2016-08-29 DIAGNOSIS — M545 Low back pain: Secondary | ICD-10-CM | POA: Diagnosis not present

## 2016-08-29 DIAGNOSIS — M5126 Other intervertebral disc displacement, lumbar region: Secondary | ICD-10-CM | POA: Insufficient documentation

## 2016-09-06 ENCOUNTER — Encounter (INDEPENDENT_AMBULATORY_CARE_PROVIDER_SITE_OTHER): Payer: Self-pay | Admitting: Orthopaedic Surgery

## 2016-09-06 ENCOUNTER — Ambulatory Visit (INDEPENDENT_AMBULATORY_CARE_PROVIDER_SITE_OTHER): Payer: Medicare HMO | Admitting: Orthopaedic Surgery

## 2016-09-06 VITALS — BP 117/76 | HR 69 | Ht 64.0 in | Wt 165.0 lb

## 2016-09-06 DIAGNOSIS — M9983 Other biomechanical lesions of lumbar region: Secondary | ICD-10-CM | POA: Diagnosis not present

## 2016-09-06 DIAGNOSIS — M4726 Other spondylosis with radiculopathy, lumbar region: Secondary | ICD-10-CM | POA: Diagnosis not present

## 2016-09-06 DIAGNOSIS — M48061 Spinal stenosis, lumbar region without neurogenic claudication: Secondary | ICD-10-CM | POA: Insufficient documentation

## 2016-09-06 NOTE — Addendum Note (Signed)
Addended by: Meyer Cory on: 09/06/2016 10:01 AM   Modules accepted: Orders

## 2016-09-06 NOTE — Progress Notes (Signed)
Office Visit Note   Patient: Patricia Hodges           Date of Birth: 1946-02-26           MRN: [id number] Visit Date: 09/06/2016              Requested by: [place], [place] [address] [place], [place] [address] PCP: [facility]   Assessment & Plan: Visit Diagnoses:  1. Lumbar foraminal stenosis   2. Other spondylosis with radiculopathy, lumbar region     Plan: Reviewed the MRI scan with her today I gave her copy of the report. She has severe foraminal stenosis at L4-5 and L5-S1 on the right side from disc protrusion and some spondylitic changes with ligamentum thickening and spurring off the facet. We will set her up for a epidural foraminal the right at L4 and L5. She can return after the injection of this not effective the nonoperative intervention was discussed.  Follow-Up Instructions: Follow-up after epidural injections  Orders:  No orders of the defined types were placed in this encounter.  No orders of the defined types were placed in this encounter.     Procedures: No procedures performed   Clinical Data: No additional findings.   Subjective: Chief Complaint  Patient presents with  . Lower Back - Pain, Follow-up    HPI 71 year old female returns persistent the back pain and right hip and leg pain. She had a prominent Schmorl's node with changes to the superior L1 endplate. She's taken anti-inflammatory she's avoided narcotic medication. Previous cervical fusion doing well. Pain bothers her on a daily basis bothers her with activities. She states she can walk maybe a block and has to stop and rest and then can repeat she just has pain in her right leg when she walks doesn't have it in her left. No associated bowel or bladder symptoms no chills and no fever. His symptoms have gradually progressed over several years. She's used Voltaren gel and also Aleve. Increased pain with walking she gets relief with supine  position. She has pain with prolonged sitting when she has pain when she's walking she gets some relief with sitting.  Review of Systems 14 point review of systems updated. Past history of gastritis, GERD, hiatal hernia cervical fusion. Increased cholesterol. Diverticulosis.. Negative for hypertension negative for stroke. She is a past smoker quit 30 years ago.   Objective: Vital Signs: BP 117/76   Pulse 69   Ht 5\' 4"  (1.626 m)   Wt 165 lb (74.8 kg)   BMI 28.32 kg/m   Physical Exam  Constitutional: She is oriented to person, place, and time. She appears well-developed.  HENT:  Head: Normocephalic.  Right Ear: External ear normal.  Left Ear: External ear normal.  Eyes: Pupils are equal, round, and reactive to light.  Neck: No tracheal deviation present. No thyromegaly present.  Cardiovascular: Normal rate.   Pulmonary/Chest: Effort normal.  Abdominal: Soft.  Musculoskeletal:  Patient has sciatic notch tenderness on the right. She is a motoric. She is able heel and toe walk.  Neurological: She is alert and oriented to person, place, and time.  Skin: Skin is warm and dry.  Psychiatric: She has a normal mood and affect. Her behavior is normal.    Ortho Exam patient has good hip range of motion. Skin over back is normal. Pain with straight leg raising 80 on the right negative on the left. Knee and ankle jerk are intact. Mild  tenderness of the greater trochanter. Distal pulses are intact knees reach full extension no effusion of venous stasis changes. Plantar foot skin is normal.  Specialty Comments:  No specialty comments available.  Imaging: Show images for MR Lumbar Spine w/o contrast  Study Result   CLINICAL DATA:  Low back pain radiating to both lower extremities  EXAM: MRI LUMBAR SPINE WITHOUT CONTRAST  TECHNIQUE: Multiplanar, multisequence MR imaging of the lumbar spine was performed. No intravenous contrast was administered.  COMPARISON:   None.  FINDINGS: Segmentation: Normal. The lowest disc space is considered to be L5-S1.  Alignment:  Grade 1 L5-S1 anterolisthesis.  Vertebrae:  Large inferior endplate Schmorl's node at L1.  Conus medullaris: Extends to the L2 level and appears normal.  Paraspinal and other soft tissues: The visualized aorta, IVC and iliac vessels are normal. The visualized retroperitoneal organs and paraspinal soft tissues are normal.  Disc levels:  T12-L1: Normal disc space and facets. No spinal canal or neuroforaminal stenosis.  L1-L2: Normal disc space and facets. No spinal canal or neuroforaminal stenosis.  L2-L3: Normal disc space and facets. No spinal canal or neuroforaminal stenosis.  L3-L4: Moderate bilateral facet hypertrophy. Minimal disc bulge. No spinal canal stenosis. Mild bilateral neural foraminal narrowing.  L4-L5: Diffuse disc bulge with superimposed central/ left subarticular extrusion with minimal inferior migration. This narrows the left lateral recess. No central spinal canal stenosis. There is an additional right foraminal protrusion superimposed on the disc bulge, which causes severe right neural foraminal stenosis.  L5-S1: Grade 1 anterolisthesis. Severe right and moderate left facet hypertrophy. Minimal pseudo disc bulge. Severe right and mild left neural foraminal stenosis.  IMPRESSION: 1. Severe right foraminal stenosis at L4-L5 and L5-S1. The findings at L4-L5 are predominantly due to a right eccentric disc bulge with superimposed right foraminal protrusion. At L5-S1, this is predominantly due to severe right facet arthrosis. 2. Left lateral recess stenosis at L4-L5 secondary to small left subarticular extrusion. Correlate for left L5 radiculopathy.   Electronically Signed   By: Ulyses Jarred M.D.   On: 08/29/2016 20:31       PMFS History: Patient Active Problem List   Diagnosis Date Noted  . Lumbar foraminal stenosis 09/06/2016   . Cervical spinal stenosis 01/30/2016  . Surgery, elective   . History of colonic polyps   . ABDOMINAL BLOATING 11/11/2009  . HELICOBACTER PYLORI GASTRITIS 11/17/2007  . SCHATZKI'S RING 11/17/2007  . GERD 11/17/2007  . HIATAL HERNIA 11/17/2007  . EOSINOPHILIC GASTROENTERITIS 82/80/0349  . Other spondylosis with radiculopathy, lumbar region 11/17/2007  . WEIGHT LOSS 11/17/2007  . NAUSEA 11/17/2007  . VOMITING 11/17/2007  . HEARTBURN 11/17/2007  . OTHER DYSPHAGIA 11/17/2007  . DIARRHEA 11/17/2007  . ABDOMINAL PAIN, HX OF 11/17/2007  . CHOLECYSTECTOMY, HX OF 11/17/2007   Past Medical History:  Diagnosis Date  . Diverticulosis   . Dysphagia   . Esophageal reflux   . Gastroenteritis, eosinophilic   . H pylori ulcer   . High cholesterol   . Osteoarthritis   . S/P colonoscopy 2006   diverticulosis, due for repeat in 2016  . S/P endoscopy 2007   Schatzki's Ring    Family History  Problem Relation Age of Onset  . Colon cancer Neg Hx     Past Surgical History:  Procedure Laterality Date  . ANTERIOR CERVICAL DECOMP/DISCECTOMY FUSION N/A 01/30/2016   Procedure: C5-6 Anterior Cervical Discectomy and Fusion, Allograft, Plate;  Surgeon: [name], MD;  Location: [facility];  Service: Orthopedics;  Laterality: N/A;  . bladder tack    . BUNIONECTOMY Right   . CARPAL TUNNEL RELEASE Left   . CATARACT EXTRACTION W/PHACO Right 11/28/2015   Procedure: CATARACT EXTRACTION PHACO AND INTRAOCULAR LENS PLACEMENT ([place]) RIGHT;  Surgeon: Tonny Branch, MD;  [facility];  Service: Ophthalmology;  Laterality: Right;  CDE: 6.96  . CATARACT EXTRACTION W/PHACO Left 12/15/2015   Procedure: CATARACT EXTRACTION PHACO AND INTRAOCULAR LENS PLACEMENT LEFT EYE CDE=9.04;  Surgeon: Tonny Branch, MD;  Location: [facility];  Service: Ophthalmology;  Laterality: Left;  left  . CESAREAN SECTION    . CHOLECYSTECTOMY    . COLONOSCOPY  2006  . COLONOSCOPY N/A 09/08/2014   Procedure: COLONOSCOPY;  Surgeon: Daneil Dolin,  MD;  [facility];  Service: Endoscopy;  Laterality: N/A;  10:30 AM  . feet surgery    . HAND SURGERY Right    on ring finger due to swollen knuckle  . Hx schatski's ring    . S/P Hysterectomy     Social History   Occupational History  . Not on file.   Social History Main Topics  . Smoking status: Former Smoker    Packs/day: 1.00    Years: 20.00    Types: Cigarettes    Quit date: 11/23/1978  . Smokeless tobacco: Never Used     Comment: quit about 30 yrs ago  . Alcohol use No  . Drug use: No  . Sexual activity: Not on file

## 2016-09-26 ENCOUNTER — Ambulatory Visit (INDEPENDENT_AMBULATORY_CARE_PROVIDER_SITE_OTHER): Payer: Medicare HMO | Admitting: Physical Medicine and Rehabilitation

## 2016-09-26 ENCOUNTER — Ambulatory Visit (INDEPENDENT_AMBULATORY_CARE_PROVIDER_SITE_OTHER): Payer: Medicare HMO

## 2016-09-26 ENCOUNTER — Encounter (INDEPENDENT_AMBULATORY_CARE_PROVIDER_SITE_OTHER): Payer: Self-pay | Admitting: Physical Medicine and Rehabilitation

## 2016-09-26 VITALS — BP 127/76 | HR 66 | Temp 98.2°F

## 2016-09-26 DIAGNOSIS — M4316 Spondylolisthesis, lumbar region: Secondary | ICD-10-CM | POA: Diagnosis not present

## 2016-09-26 DIAGNOSIS — M48061 Spinal stenosis, lumbar region without neurogenic claudication: Secondary | ICD-10-CM

## 2016-09-26 MED ORDER — LIDOCAINE HCL (PF) 1 % IJ SOLN
2.0000 mL | Freq: Once | INTRAMUSCULAR | Status: AC
  Administered 2016-09-26: 2 mL

## 2016-09-26 MED ORDER — METHYLPREDNISOLONE ACETATE 80 MG/ML IJ SUSP
80.0000 mg | Freq: Once | INTRAMUSCULAR | Status: AC
  Administered 2016-09-26: 80 mg

## 2016-09-26 NOTE — Patient Instructions (Signed)

## 2016-09-26 NOTE — Progress Notes (Deleted)
Right side lower back pain for years. Increased pain in the last several months. Constant pain. Worse with walking and standing. Some relief with sitting. Denies leg pain.

## 2016-09-26 NOTE — Procedures (Signed)
Lumbosacral Transforaminal Epidural Steroid Injection - Infraneural Approach with Fluoroscopic Guidance  Patient: Patricia Hodges      Date of Birth: [birth year] MRN: [id number] PCP: Patricia Sites, MD      Visit Date: 09/26/2016   Patricia Hodges is a 71 year old female with constant worsening and recalcitrant right low back pain with some referral into the buttock and hip but not really down the leg. She is followed by Dr. Lorin Mercy. MRI evidence of foraminal stenosis pretty significant at L4-5 and L5-S1. There is facet arthropathy as well. We are going to complete a diagnostic and therapeutic right L4-L5 transforaminal epidural steroid injection.  Universal Protocol:     Consent Given By: the patient  Position: PRONE   Additional Comments: Vital signs were monitored before and after the procedure. Patient was prepped and draped in the usual sterile fashion. The correct patient, procedure, and site was verified.   Injection Procedure Details:  Procedure Site One Meds Administered:  Meds ordered this encounter  Medications  . lidocaine (PF) (XYLOCAINE) 1 % injection 2 mL  . methylPREDNISolone acetate (DEPO-MEDROL) injection 80 mg      Laterality: Right  Location/Site:  L4-L5 L5-S1  Needle size: 22 G  Needle type: Spinal  Needle Placement: Transforaminal  Findings:  -Contrast Used: 1 mL iohexol 180 mg iodine/mL   -Comments: Excellent flow of contrast along the nerve and into the epidural space.  Procedure Details: After squaring off the end-plates of the desired vertebral level to get a true AP view, the C-arm was obliqued to the painful side so that the superior articulating process is positioned about 1/3 the length of the inferior endplate.  The needle was aimed toward the junction of the superior articular process and the transverse process of the inferior vertebrae. The needle's initial entry is in the lower third of the foramen through Kambin's triangle. The soft  tissues overlying this target were infiltrated with 2-3 ml. of 1% Lidocaine without Epinephrine.  The spinal needle was then inserted and advanced toward the target using a "trajectory" view along the fluoroscope beam.  Under AP and lateral visualization, the needle was advanced so it did not puncture dura and did not traverse medially beyond the 6 o'clock position of the pedicle. Bi-planar projections were used to confirm position. Aspiration was confirmed to be negative for CSF and/or blood. A 1-2 ml. volume of Isovue-250 was injected and flow of contrast was noted at each level. Radiographs were obtained for documentation purposes.   After attaining the desired flow of contrast documented above, a 0.5 to 1.0 ml test dose of 0.25% Marcaine was injected into each respective transforaminal space.  The patient was observed for 90 seconds post injection.  After no sensory deficits were reported, and normal lower extremity motor function was noted,   the above injectate was administered so that equal amounts of the injectate were placed at each foramen (level) into the transforaminal epidural space.   Additional Comments:  The patient tolerated the procedure well Dressing: Band-Aid    Post-procedure details: Patient was observed during the procedure. Post-procedure instructions were reviewed.  Patient left the clinic in stable condition.

## 2016-10-15 IMAGING — MR MR CERVICAL SPINE W/O CM
4 of 5 series · 15 of 48 positions shown · non-contrast
Comparison: Cervical spine radiographs 05/05/2014. No prior MRI for
comparison

CLINICAL DATA: Cervical radiculopathy

EXAM:
MRI CERVICAL SPINE WITHOUT CONTRAST
TECHNIQUE: Multiplanar, multisequence MR imaging of the cervical spine was
performed. No intravenous contrast was administered.

[Series 3: T2 · sagittal · 3.0mm · 0.45mm/px · 5 of 13 slices shown (1 of 2)]
[im 1/13]
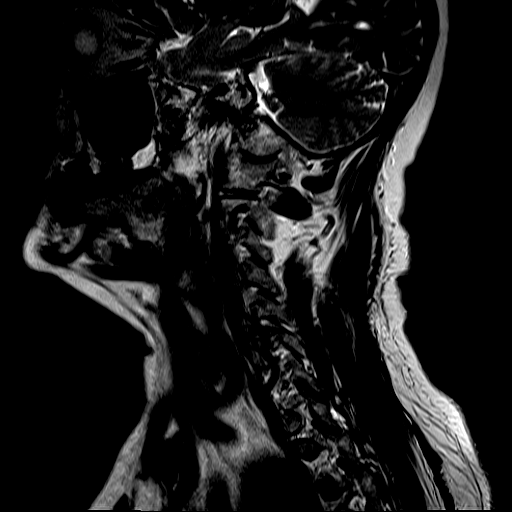
[im 4/13]
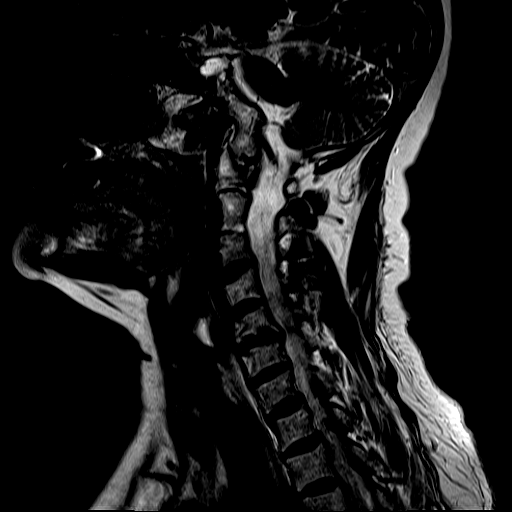
[im 7/13]
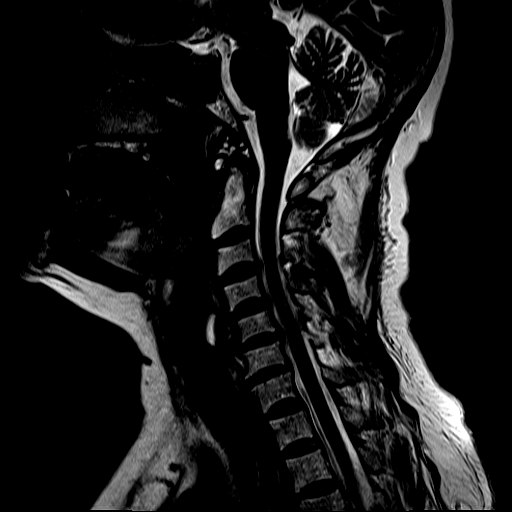
[im 10/13]
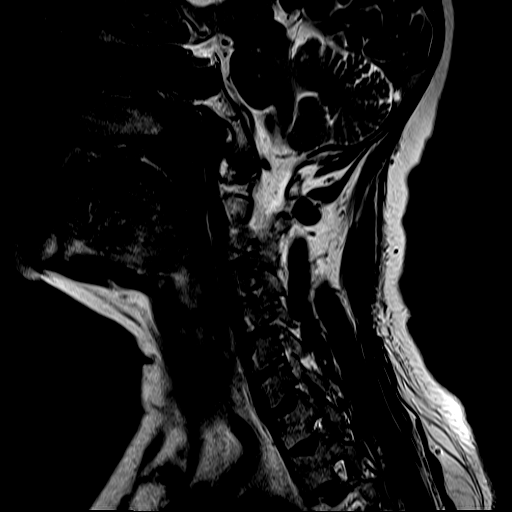
[im 13/13]
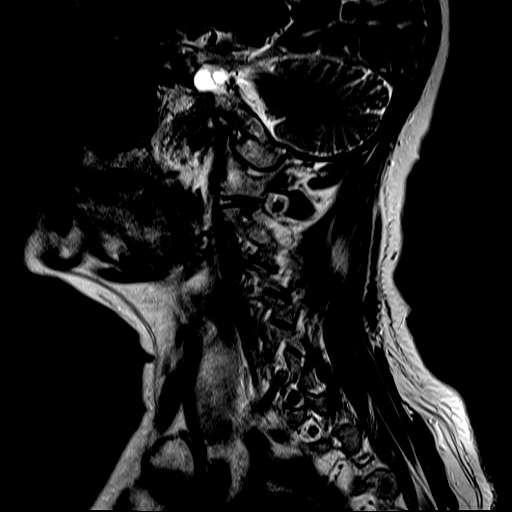

[Series 4: FLAIR · sagittal · 3.0mm · 0.47mm/px · 3 of 13 slices shown]
[im 1/13]
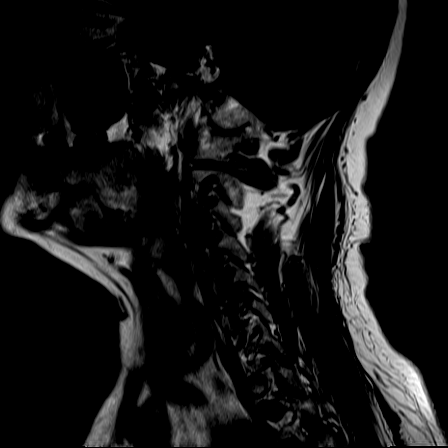
[im 7/13]
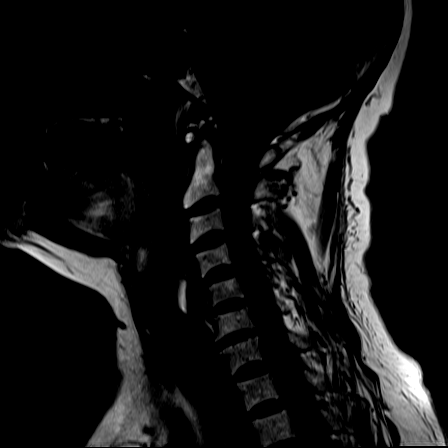
[im 13/13]
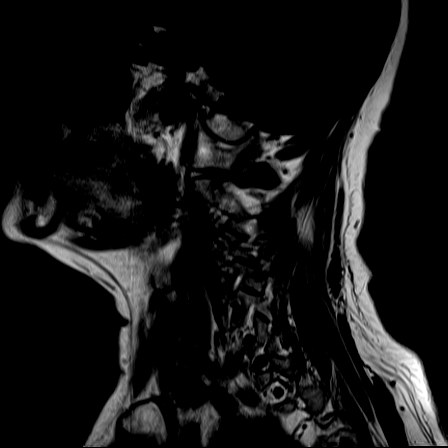

[Series 5: ir sagital · sagittal · 3.0mm · 0.26mm/px · 3 of 13 slices shown]
[im 3/13]
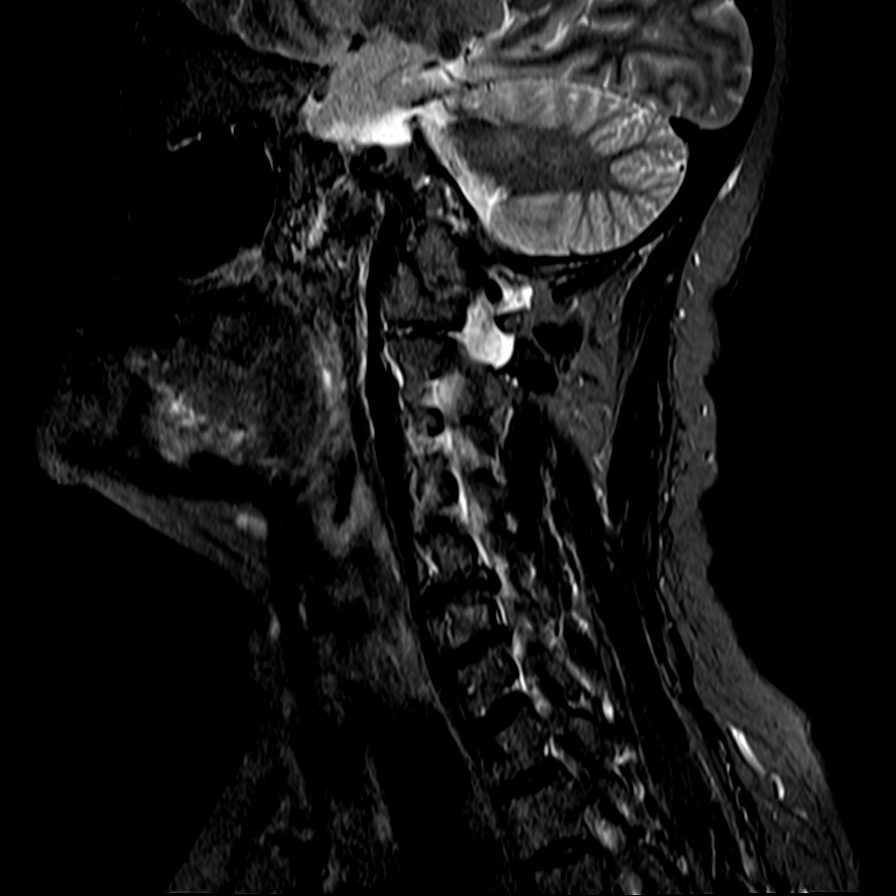
[im 8/13]
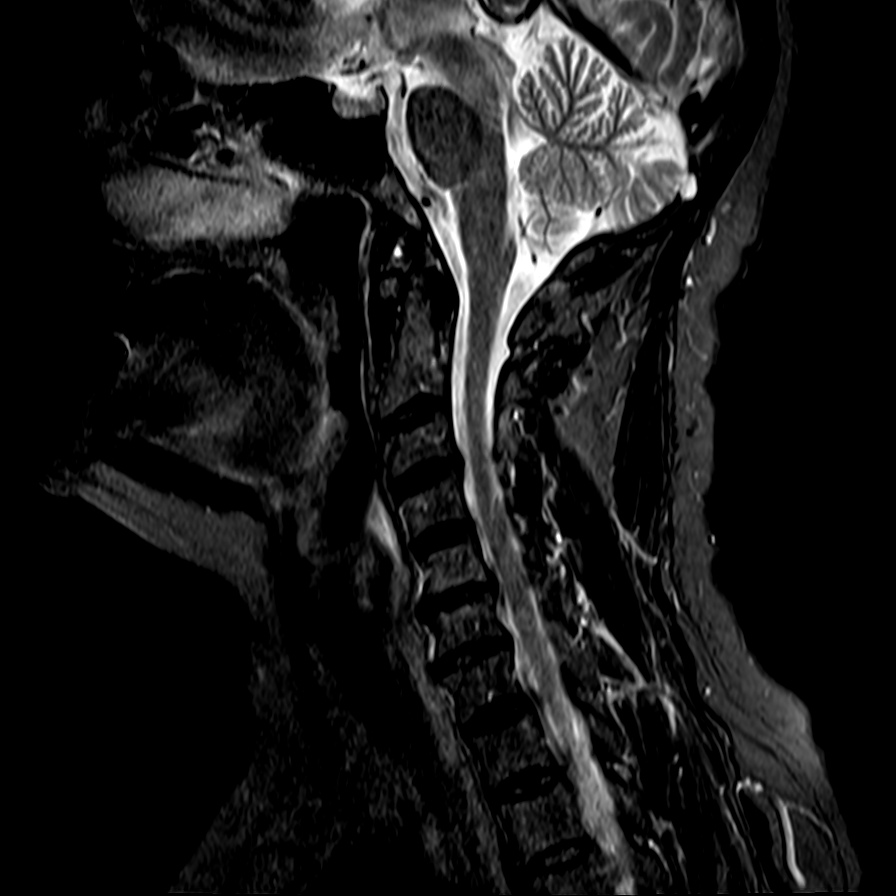
[im 13/13]
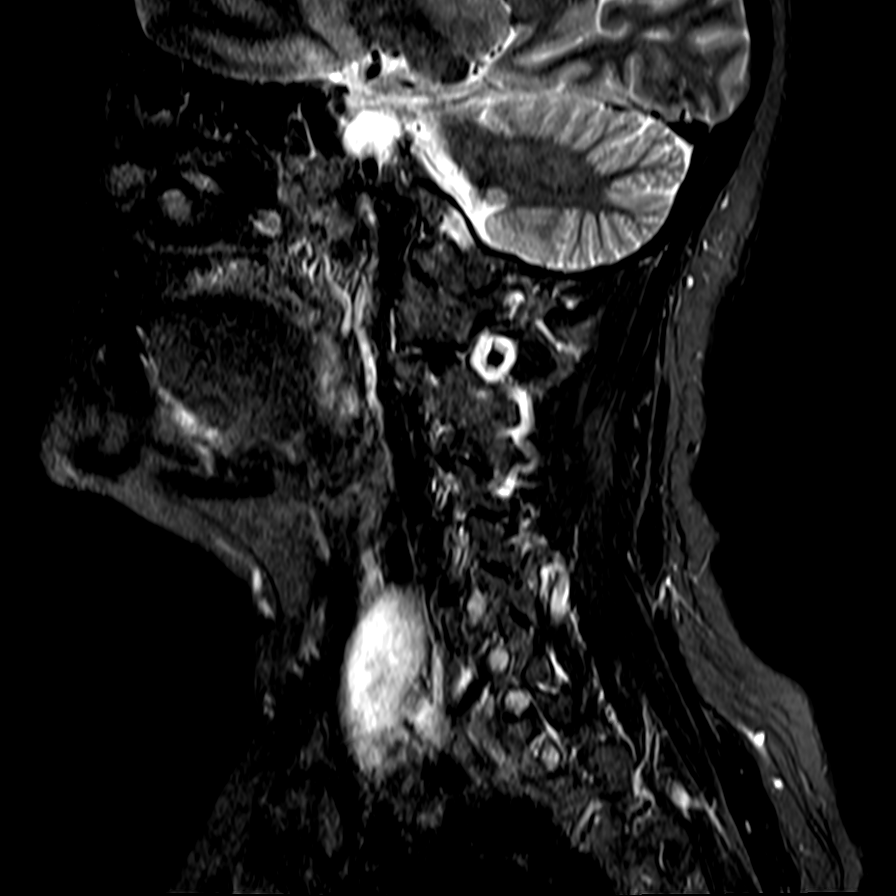

[Series 7: T2 · axial · 3.0mm · 0.19mm/px · z∈[-82,+8]mm · 4 of 36 slices shown (2 of 2)]
[im 3/36]
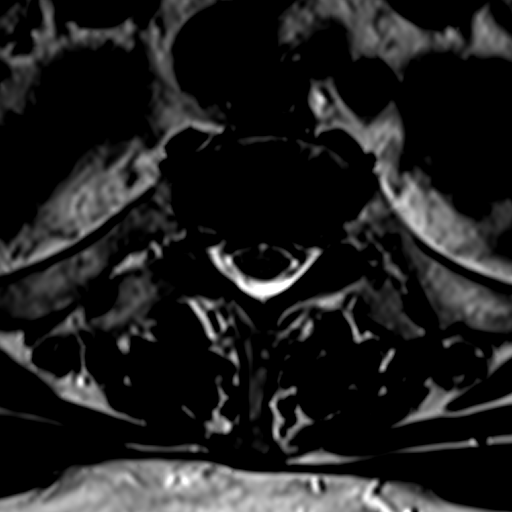
[im 5/36]
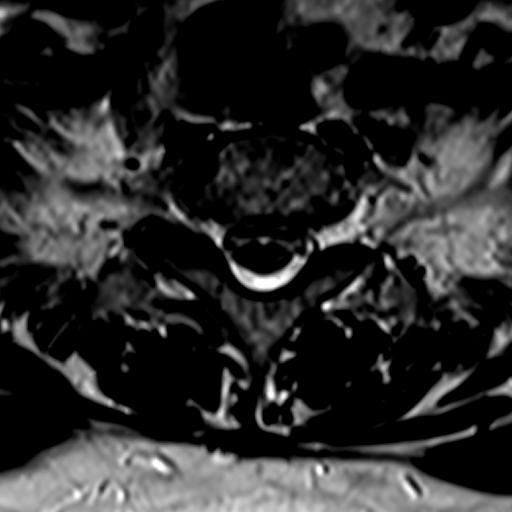
[im 19/36]
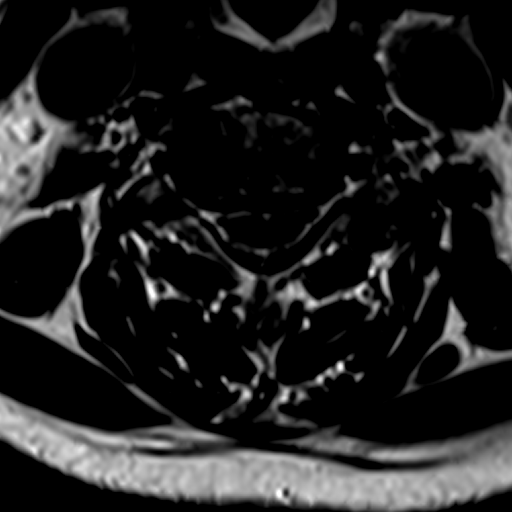
[im 31/36]
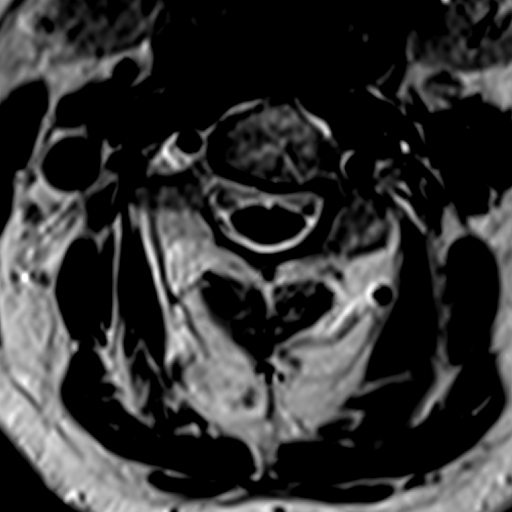

[15 of 48 positions shown; findings below may reference images not displayed]

FINDINGS: Alignment: Mild anterior slip C6-7. Straightening of the cervical
lordosis.

Vertebrae: Negative for fracture or mass.  Normal bone marrow.

Cord: Normal spinal cord

Posterior Fossa, vertebral arteries, paraspinal tissues: Negative

Disc levels:

C2-3:  Negative

C3-4: Mild left foraminal narrowing due to uncinate spurring and
mild facet degeneration. No cord deformity.

C4-5: Mild disc bulging without significant spinal or foraminal
stenosis

C5-6: Disc degeneration and spondylosis. Uncinate spurring right
greater than left. There is foraminal narrowing right greater than
left and mild spinal stenosis.

C6-7: Disc degeneration with mild spurring. No significant spinal
stenosis

C7-T1:  Negative
IMPRESSION: Mild cervical spine degenerative change. Mild spinal stenosis at
C5-6 due to spurring. Right greater than left foraminal stenosis at
C5-6 due to spurring.

## 2016-11-15 ENCOUNTER — Encounter (INDEPENDENT_AMBULATORY_CARE_PROVIDER_SITE_OTHER): Payer: Self-pay | Admitting: Orthopaedic Surgery

## 2016-11-15 ENCOUNTER — Ambulatory Visit (INDEPENDENT_AMBULATORY_CARE_PROVIDER_SITE_OTHER): Payer: Medicare HMO

## 2016-11-15 ENCOUNTER — Ambulatory Visit (INDEPENDENT_AMBULATORY_CARE_PROVIDER_SITE_OTHER): Payer: Medicare HMO | Admitting: Orthopaedic Surgery

## 2016-11-15 VITALS — BP 131/81 | HR 72 | Ht 64.0 in | Wt 180.0 lb

## 2016-11-15 DIAGNOSIS — M25571 Pain in right ankle and joints of right foot: Secondary | ICD-10-CM

## 2016-11-15 NOTE — Progress Notes (Signed)
Office Visit Note   Patient: Patricia Hodges           Date of Birth: Apr 16, 1945           MRN: [id number] Visit Date: 11/15/2016              Requested by: Sharilyn Sites, [address], [place] [address] PCP: Sharilyn Sites, MD   Assessment & Plan: Visit Diagnoses:  1. Pain in right ankle and joints of right foot     Plan: Work slip given no work times 2 wks. She can return in one month to discuss lumbar stenosis.   Follow-Up Instructions: Return in about 1 month (around 12/16/2016).   Orders:  Orders Placed This Encounter  Procedures  . XR Ankle Complete Right  . XR Foot Complete Right   No orders of the defined types were placed in this encounter.     Procedures: No procedures performed   Clinical Data: No additional findings.   Subjective: Chief Complaint  Patient presents with  . Lower Back - Pain  . Right Leg - Pain  . Left Leg - Pain  . Right Foot - Pain    HPI 71 year old female is seen after fall down the steps she went down 5 steps and his first step. She had pain in her knees numbness and tingling in her toes and her pain is now increased with ecchymosis and the right foot that extends out to the toes. She had mild bruising in the left foot this has resolved. Sedimentation rate difficulty walking A widower forefoot. Increased numbness and tingling. Previous MRI showed severe foraminal stenosis and lumbar spine L4-5 and L5-S1 she's had previous epidural injection with persistent.  Review of Systems14 pt ROS unchanged from 09/06/16 other than as mentioned in HPI.    Objective: Vital Signs: BP 131/81   Pulse 72   Ht 5\' 4"  (1.626 m)   Wt 180 lb (81.6 kg)   BMI 30.90 kg/m   Physical Exam  Constitutional: She is oriented to person, place, and time. She appears well-developed.  HENT:  Head: Normocephalic.  Right Ear: External ear normal.  Left Ear: External ear normal.  Eyes: Pupils are equal, round, and reactive to light.    Neck: No tracheal deviation present. No thyromegaly present.  Cardiovascular: Normal rate.   Pulmonary/Chest: Effort normal.  Abdominal: Soft.  Musculoskeletal:  Test test test 12 patient has good hip range of motion. There is edema over the forefoot ecchymosis over the right foot tenderness over the forefoot. Tenderness and over the anterior talofibular ligament. No extension on the left foot is normal. Knee show no effusion collateral ligaments are stable negative anterior drawer  Neurological: She is alert and oriented to person, place, and time.  Skin: Skin is warm and dry.  Psychiatric: She has a normal mood and affect. Her behavior is normal.    Ortho Exam  Specialty Comments:  No specialty comments available.  Imaging: Xr Ankle Complete Right  Result Date: 11/15/2016  Three-view x-rays right ankle obtained and reviewd these are negative for acute fracture. No degenerative changes re seen. Impression: Normal right ankl x-ray.  Xr Foot Complete Right  Result Date: 11/15/2016  Three-view x-rays right foo obtaied and reviewed. This shows previous buninette procedure ith scew n the ffth metaarsal neck. No new acute fracturs are vislize. Impression :Normal right foot xrays neg for acute Fx.     PMFS History: Patient Active Problem List   Diagnosis Date  Noted  . Lumbar foraminal stenosis 09/06/2016  . Cervical spinal stenosis 01/30/2016  . Surgery, elective   . History of colonic polyps   . ABDOMINAL BLOATING 11/11/2009  . HELICOBACTER PYLORI GASTRITIS 11/17/2007  . SCHATZKI'S RING 11/17/2007  . GERD 11/17/2007  . HIATAL HERNIA 11/17/2007  . EOSINOPHILIC GASTROENTERITIS 29/79/8921  . Other spondylosis with radiculopathy, lumbar region 11/17/2007  . WEIGHT LOSS 11/17/2007  . NAUSEA 11/17/2007  . VOMITING 11/17/2007  . HEARTBURN 11/17/2007  . OTHER DYSPHAGIA 11/17/2007  . DIARRHEA 11/17/2007  . ABDOMINAL PAIN, HX OF 11/17/2007  . CHOLECYSTECTOMY, HX OF 11/17/2007    Past Medical History:  Diagnosis Date  . Diverticulosis   . Dysphagia   . Esophageal reflux   . Gastroenteritis, eosinophilic   . H pylori ulcer   . High cholesterol   . Osteoarthritis   . S/P colonoscopy 2006   diverticulosis, due for repeat in 2016  . S/P endoscopy 2007   Schatzki's Ring    Family History  Problem Relation Age of Onset  . Colon cancer Neg Hx     Past Surgical History:  Procedure Laterality Date  . ANTERIOR CERVICAL DECOMP/DISCECTOMY FUSION N/A 01/30/2016   Procedure: C5-6 Anterior Cervical Discectomy and Fusion, Allograft, Plate;  Surgeon: [name], MD;  Location: [facility];  Service: Orthopedics;  Laterality: N/A;  . bladder tack    . BUNIONECTOMY Right   . CARPAL TUNNEL RELEASE Left   . CATARACT EXTRACTION W/PHACO Right 11/28/2015   Procedure: CATARACT EXTRACTION PHACO AND INTRAOCULAR LENS PLACEMENT ([place]) RIGHT;  Surgeon: Tonny Branch, MD;  Location: [facility];  Service: Ophthalmology;  Laterality: Right;  CDE: 6.96  . CATARACT EXTRACTION W/PHACO Left 12/15/2015   Procedure: CATARACT EXTRACTION PHACO AND INTRAOCULAR LENS PLACEMENT LEFT EYE CDE=9.04;  Surgeon: Tonny Branch, MD;  Location: [facility];  Service: Ophthalmology;  Laterality: Left;  left  . CESAREAN SECTION    . CHOLECYSTECTOMY    . COLONOSCOPY  2006  . COLONOSCOPY N/A 09/08/2014   Procedure: COLONOSCOPY;  Surgeon: Daneil Dolin, MD;  [facility];  Service: Endoscopy;  Laterality: N/A;  10:30 AM  . feet surgery    . HAND SURGERY Right    on ring finger due to swollen knuckle  . Hx schatski's ring    . S/P Hysterectomy     Social History   Occupational History  . Not on file.   Social History Main Topics  . Smoking status: Former Smoker    Packs/day: 1.00    Years: 20.00    Types: Cigarettes    Quit date: 11/23/1978  . Smokeless tobacco: Never Used     Comment: quit about 30 yrs ago  . Alcohol use No  . Drug use: No  . Sexual activity: Not on file

## 2016-12-01 IMAGING — RF DG C-ARM 61-120 MIN
1 series · 2 of 2 positions shown · non-contrast
Comparison: MRI 12/14/2015

CLINICAL DATA: Surgery:  C5-C6 ACDF.

FLUOROSCOPY TIME:  7 seconds.
EXAM:
DG C-ARM 61-120 MIN; CERVICAL SPINE - 2-3 VIEW

[Series 1: run · 2 of 2 slices shown]
[im 1/2]
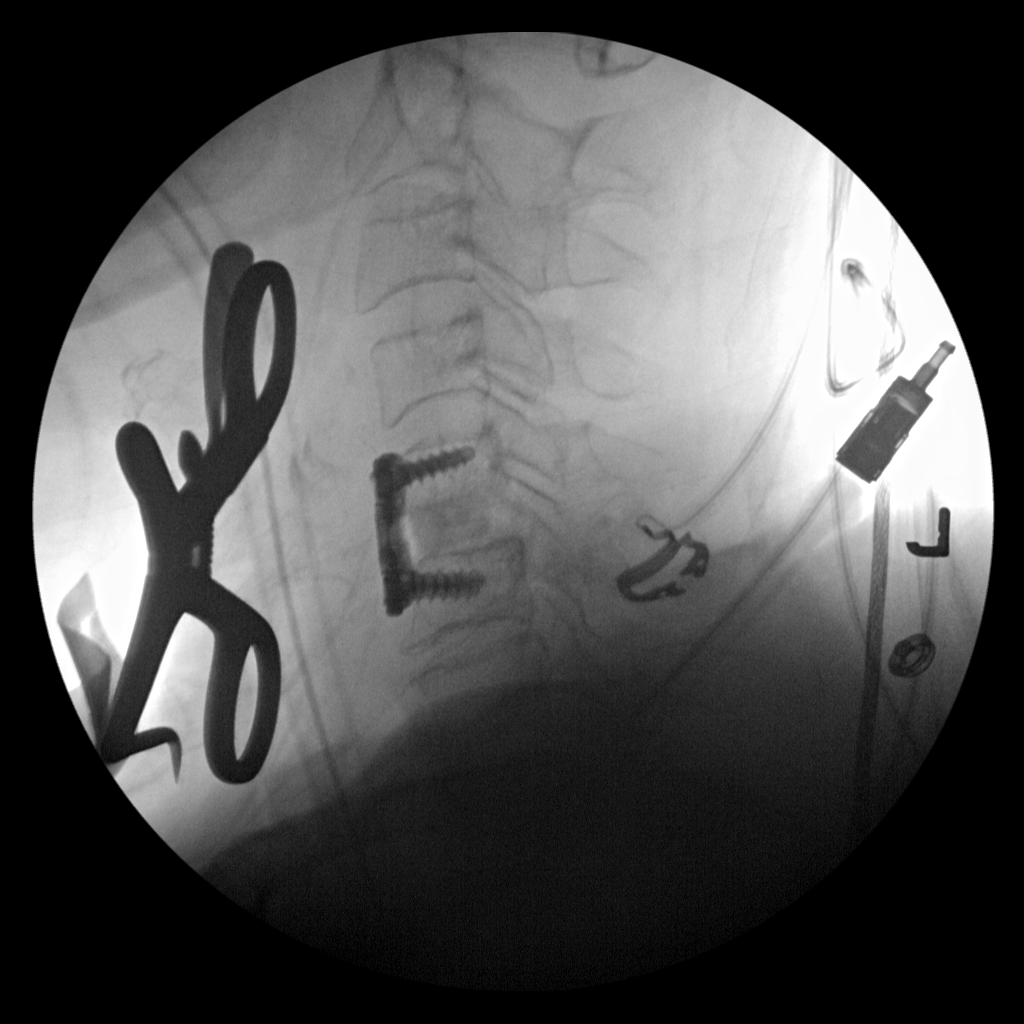
[im 2/2]
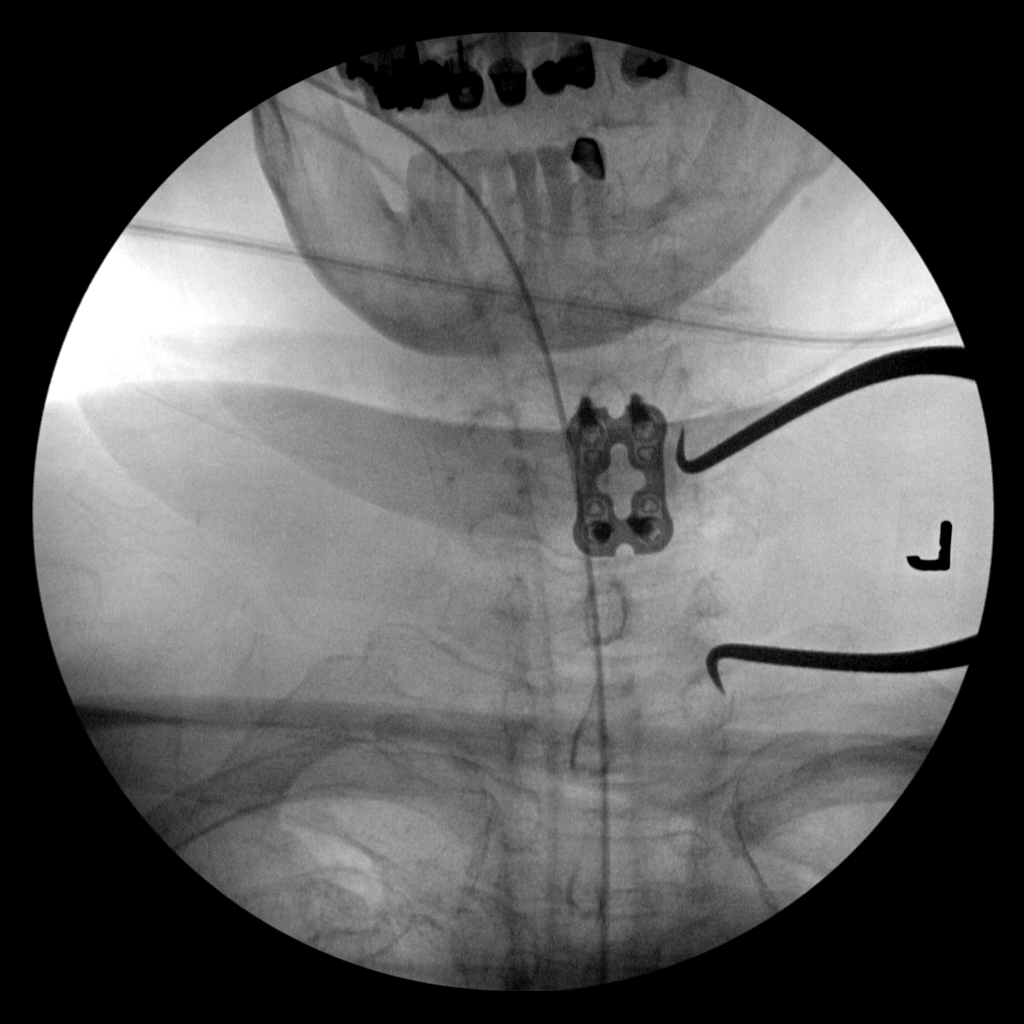

[2 of 2 positions shown; findings below may reference images not displayed]

FINDINGS: Frontal and lateral projections demonstrate anterior fusion at C5-6
with interbody bone plug. Endotracheal tube is in place anterior to
the cervical spine.
IMPRESSION: Anterior fusion C5-6.

## 2016-12-13 ENCOUNTER — Encounter (INDEPENDENT_AMBULATORY_CARE_PROVIDER_SITE_OTHER): Payer: Self-pay | Admitting: Orthopaedic Surgery

## 2016-12-13 ENCOUNTER — Ambulatory Visit (INDEPENDENT_AMBULATORY_CARE_PROVIDER_SITE_OTHER): Payer: Medicare HMO | Admitting: Orthopaedic Surgery

## 2016-12-13 VITALS — BP 133/81 | HR 61 | Ht 64.0 in | Wt 180.0 lb

## 2016-12-13 DIAGNOSIS — M9983 Other biomechanical lesions of lumbar region: Secondary | ICD-10-CM

## 2016-12-13 DIAGNOSIS — M4726 Other spondylosis with radiculopathy, lumbar region: Secondary | ICD-10-CM | POA: Diagnosis not present

## 2016-12-13 DIAGNOSIS — M48061 Spinal stenosis, lumbar region without neurogenic claudication: Secondary | ICD-10-CM

## 2016-12-13 NOTE — Progress Notes (Signed)
Office Visit Note   Patient: Patricia Hodges           Date of Birth: [birth year]           MRN: [id number] Visit Date: 12/13/2016              Requested by: Sharilyn Sites, [address], [place] [address] PCP: Sharilyn Sites, MD   Assessment & Plan: Visit Diagnoses:  1. Lumbar foraminal stenosis   2. Other spondylosis with radiculopathy, lumbar region     Plan: Patient currently has a lot of the issues with other family members receiving medical care needing surgery. We discussed review the MRI scan once again with her foraminal stenosis and disc protrusion. L4-5 levels were thoroughly discussed microdiscectomy lateral recess decompression on the right at L4-5 versus 2 level lumbar fusion instrumented. We'll check her back again in 2 months.  Follow-Up Instructions: Return in about 2 months (around 02/12/2017).   Orders:  No orders of the defined types were placed in this encounter.  No orders of the defined types were placed in this encounter.     Procedures: No procedures performed   Clinical Data: No additional findings.   Subjective: Chief Complaint  Patient presents with  . Right Ankle - Pain  . Lower Back - Pain    HPI 71 year old female with known severe foraminal stenosis L4-5 and L5-S1 previous epidurals 09/26/2016 with fall after injection. She sprained her right ankle and states her ankles doing better but she still has occasional swelling in her foot. Shows some burning in both feet with activities. She has family members receiving medical care for acute problems and currently not able to consider any possible surgery for lumbar spine at this time. She has more right leg pain and left leg pain but does have bilateral foot numbness. We have discussed single level microdiscectomy on the right at L4-5 versus 2 level instrument fusion previously.  Review of Systems 14 point review of systems unchanged from 09/06/2016 of the mentioned in history  of present illness. Objective: Vital Signs: BP 133/81   Pulse 61   Ht 5\' 4"  (1.626 m)   Wt 180 lb (81.6 kg)   BMI 30.90 kg/m   Physical Exam  Constitutional: She is oriented to person, place, and time. She appears well-developed.  HENT:  Head: Normocephalic.  Right Ear: External ear normal.  Left Ear: External ear normal.  Eyes: Pupils are equal, round, and reactive to light.  Neck: No tracheal deviation present. No thyromegaly present.  Cardiovascular: Normal rate.   Pulmonary/Chest: Effort normal.  Abdominal: Soft.  Neurological: She is alert and oriented to person, place, and time.  Skin: Skin is warm and dry.  Psychiatric: She has a normal mood and affect. Her behavior is normal.    Ortho Exam normal hip range of motion right greater than left sciatic notch tenderness and pain with straight leg raising at 90. Right anterior talofibular ligament region is minimally tender. Negative anterior drawer test. Anterior tib EHL is active both right and left. Negative foot drop gait. Pain with popliteal compression straight leg raising on the right negative on the left. Distal pulses are intact.  Specialty Comments:  No specialty comments available.  Imaging: No results found.   PMFS History: Patient Active Problem List   Diagnosis Date Noted  . Lumbar foraminal stenosis 09/06/2016  . Cervical spinal stenosis 01/30/2016  . Surgery, elective   . History of colonic polyps   . ABDOMINAL  BLOATING 11/11/2009  . HELICOBACTER PYLORI GASTRITIS 11/17/2007  . SCHATZKI'S RING 11/17/2007  . GERD 11/17/2007  . HIATAL HERNIA 11/17/2007  . EOSINOPHILIC GASTROENTERITIS 29/57/4734  . Other spondylosis with radiculopathy, lumbar region 11/17/2007  . WEIGHT LOSS 11/17/2007  . NAUSEA 11/17/2007  . VOMITING 11/17/2007  . HEARTBURN 11/17/2007  . OTHER DYSPHAGIA 11/17/2007  . DIARRHEA 11/17/2007  . ABDOMINAL PAIN, HX OF 11/17/2007  . CHOLECYSTECTOMY, HX OF 11/17/2007   Past Medical  History:  Diagnosis Date  . Diverticulosis   . Dysphagia   . Esophageal reflux   . Gastroenteritis, eosinophilic   . H pylori ulcer   . High cholesterol   . Osteoarthritis   . S/P colonoscopy 2006   diverticulosis, due for repeat in 2016  . S/P endoscopy 2007   Schatzki's Ring    Family History  Problem Relation Age of Onset  . Colon cancer Neg Hx     Past Surgical History:  Procedure Laterality Date  . ANTERIOR CERVICAL DECOMP/DISCECTOMY FUSION N/A 01/30/2016   Procedure: C5-6 Anterior Cervical Discectomy and Fusion, Allograft, Plate;  Surgeon: [name], MD;  Location: [facility];  Service: Orthopedics;  Laterality: N/A;  . bladder tack    . BUNIONECTOMY Right   . CARPAL TUNNEL RELEASE Left   . CATARACT EXTRACTION W/PHACO Right 11/28/2015   Procedure: CATARACT EXTRACTION PHACO AND INTRAOCULAR LENS PLACEMENT ([place]) RIGHT;  Surgeon: Tonny Branch, MD;  Location: [facility];  Service: Ophthalmology;  Laterality: Right;  CDE: 6.96  . CATARACT EXTRACTION W/PHACO Left 12/15/2015   Procedure: CATARACT EXTRACTION PHACO AND INTRAOCULAR LENS PLACEMENT LEFT EYE CDE=9.04;  Surgeon: Tonny Branch, MD;  Location: [facility];  Service: Ophthalmology;  Laterality: Left;  left  . CESAREAN SECTION    . CHOLECYSTECTOMY    . COLONOSCOPY  2006  . COLONOSCOPY N/A 09/08/2014   Procedure: COLONOSCOPY;  Surgeon: Daneil Dolin, MD;  [facility];  Service: Endoscopy;  Laterality: N/A;  10:30 AM  . feet surgery    . HAND SURGERY Right    on ring finger due to swollen knuckle  . Hx schatski's ring    . S/P Hysterectomy     Social History   Occupational History  . Not on file.   Social History Main Topics  . Smoking status: Former Smoker    Packs/day: 1.00    Years: 20.00    Types: Cigarettes    Quit date: 11/23/1978  . Smokeless tobacco: Never Used     Comment: quit about 30 yrs ago  . Alcohol use No  . Drug use: No  . Sexual activity: Not on file

## 2016-12-25 DIAGNOSIS — R69 Illness, unspecified: Secondary | ICD-10-CM | POA: Diagnosis not present

## 2017-02-18 DIAGNOSIS — Z6830 Body mass index (BMI) 30.0-30.9, adult: Secondary | ICD-10-CM | POA: Diagnosis not present

## 2017-02-18 DIAGNOSIS — E6609 Other obesity due to excess calories: Secondary | ICD-10-CM | POA: Diagnosis not present

## 2017-02-18 DIAGNOSIS — Z1389 Encounter for screening for other disorder: Secondary | ICD-10-CM | POA: Diagnosis not present

## 2017-02-18 DIAGNOSIS — J069 Acute upper respiratory infection, unspecified: Secondary | ICD-10-CM | POA: Diagnosis not present

## 2017-04-01 ENCOUNTER — Other Ambulatory Visit (HOSPITAL_COMMUNITY): Payer: Self-pay | Admitting: Family Medicine

## 2017-04-01 DIAGNOSIS — Z1231 Encounter for screening mammogram for malignant neoplasm of breast: Secondary | ICD-10-CM

## 2017-04-03 DIAGNOSIS — G8929 Other chronic pain: Secondary | ICD-10-CM | POA: Diagnosis not present

## 2017-04-03 DIAGNOSIS — Z825 Family history of asthma and other chronic lower respiratory diseases: Secondary | ICD-10-CM | POA: Diagnosis not present

## 2017-04-03 DIAGNOSIS — Z791 Long term (current) use of non-steroidal anti-inflammatories (NSAID): Secondary | ICD-10-CM | POA: Diagnosis not present

## 2017-04-03 DIAGNOSIS — K219 Gastro-esophageal reflux disease without esophagitis: Secondary | ICD-10-CM | POA: Diagnosis not present

## 2017-04-03 DIAGNOSIS — Z6830 Body mass index (BMI) 30.0-30.9, adult: Secondary | ICD-10-CM | POA: Diagnosis not present

## 2017-04-03 DIAGNOSIS — E669 Obesity, unspecified: Secondary | ICD-10-CM | POA: Diagnosis not present

## 2017-04-03 DIAGNOSIS — R69 Illness, unspecified: Secondary | ICD-10-CM | POA: Diagnosis not present

## 2017-04-03 DIAGNOSIS — Z809 Family history of malignant neoplasm, unspecified: Secondary | ICD-10-CM | POA: Diagnosis not present

## 2017-04-03 DIAGNOSIS — Z823 Family history of stroke: Secondary | ICD-10-CM | POA: Diagnosis not present

## 2017-04-03 DIAGNOSIS — Z79891 Long term (current) use of opiate analgesic: Secondary | ICD-10-CM | POA: Diagnosis not present

## 2017-04-18 ENCOUNTER — Ambulatory Visit (HOSPITAL_COMMUNITY)
Admission: RE | Admit: 2017-04-18 | Discharge: 2017-04-18 | Disposition: A | Discharge status: Home / Self Care | Payer: Medicare HMO | Source: Ambulatory Visit | Attending: Family Medicine | Admitting: Family Medicine

## 2017-04-18 DIAGNOSIS — N6310 Unspecified lump in the right breast, unspecified quadrant: Secondary | ICD-10-CM | POA: Diagnosis not present

## 2017-04-18 DIAGNOSIS — M159 Polyosteoarthritis, unspecified: Secondary | ICD-10-CM | POA: Diagnosis not present

## 2017-04-18 DIAGNOSIS — K219 Gastro-esophageal reflux disease without esophagitis: Secondary | ICD-10-CM | POA: Diagnosis not present

## 2017-04-18 DIAGNOSIS — Z1389 Encounter for screening for other disorder: Secondary | ICD-10-CM | POA: Diagnosis not present

## 2017-04-18 DIAGNOSIS — R928 Other abnormal and inconclusive findings on diagnostic imaging of breast: Secondary | ICD-10-CM | POA: Insufficient documentation

## 2017-04-18 DIAGNOSIS — R002 Palpitations: Secondary | ICD-10-CM | POA: Diagnosis not present

## 2017-04-18 DIAGNOSIS — Z1231 Encounter for screening mammogram for malignant neoplasm of breast: Secondary | ICD-10-CM | POA: Diagnosis not present

## 2017-04-18 DIAGNOSIS — R69 Illness, unspecified: Secondary | ICD-10-CM | POA: Diagnosis not present

## 2017-04-18 DIAGNOSIS — Z0001 Encounter for general adult medical examination with abnormal findings: Secondary | ICD-10-CM | POA: Diagnosis not present

## 2017-04-18 DIAGNOSIS — Z6830 Body mass index (BMI) 30.0-30.9, adult: Secondary | ICD-10-CM | POA: Diagnosis not present

## 2017-04-18 DIAGNOSIS — E785 Hyperlipidemia, unspecified: Secondary | ICD-10-CM | POA: Diagnosis not present

## 2017-04-18 DIAGNOSIS — E782 Mixed hyperlipidemia: Secondary | ICD-10-CM | POA: Diagnosis not present

## 2017-04-22 ENCOUNTER — Other Ambulatory Visit (HOSPITAL_COMMUNITY): Payer: Self-pay | Admitting: Family Medicine

## 2017-04-22 DIAGNOSIS — N6310 Unspecified lump in the right breast, unspecified quadrant: Secondary | ICD-10-CM

## 2017-04-25 ENCOUNTER — Other Ambulatory Visit: Payer: Self-pay

## 2017-04-25 DIAGNOSIS — L7211 Pilar cyst: Secondary | ICD-10-CM | POA: Diagnosis not present

## 2017-04-25 DIAGNOSIS — C44519 Basal cell carcinoma of skin of other part of trunk: Secondary | ICD-10-CM | POA: Diagnosis not present

## 2017-04-25 DIAGNOSIS — L570 Actinic keratosis: Secondary | ICD-10-CM | POA: Diagnosis not present

## 2017-04-25 DIAGNOSIS — B078 Other viral warts: Secondary | ICD-10-CM | POA: Diagnosis not present

## 2017-04-25 DIAGNOSIS — C4491 Basal cell carcinoma of skin, unspecified: Secondary | ICD-10-CM

## 2017-04-25 DIAGNOSIS — D485 Neoplasm of uncertain behavior of skin: Secondary | ICD-10-CM | POA: Diagnosis not present

## 2017-04-25 HISTORY — DX: Basal cell carcinoma of skin, unspecified: C44.91

## 2017-04-29 DIAGNOSIS — J3489 Other specified disorders of nose and nasal sinuses: Secondary | ICD-10-CM | POA: Diagnosis not present

## 2017-04-29 DIAGNOSIS — J029 Acute pharyngitis, unspecified: Secondary | ICD-10-CM | POA: Diagnosis not present

## 2017-04-29 DIAGNOSIS — H6692 Otitis media, unspecified, left ear: Secondary | ICD-10-CM | POA: Diagnosis not present

## 2017-04-29 DIAGNOSIS — Z6830 Body mass index (BMI) 30.0-30.9, adult: Secondary | ICD-10-CM | POA: Diagnosis not present

## 2017-04-29 DIAGNOSIS — R509 Fever, unspecified: Secondary | ICD-10-CM | POA: Diagnosis not present

## 2017-04-29 DIAGNOSIS — E6609 Other obesity due to excess calories: Secondary | ICD-10-CM | POA: Diagnosis not present

## 2017-04-30 ENCOUNTER — Ambulatory Visit (HOSPITAL_COMMUNITY)
Admission: RE | Admit: 2017-04-30 | Discharge: 2017-04-30 | Disposition: A | Discharge status: Home / Self Care | Payer: Medicare HMO | Source: Ambulatory Visit | Attending: Family Medicine | Admitting: Family Medicine

## 2017-04-30 ENCOUNTER — Encounter (HOSPITAL_COMMUNITY): Payer: Medicare HMO

## 2017-04-30 DIAGNOSIS — N6310 Unspecified lump in the right breast, unspecified quadrant: Secondary | ICD-10-CM

## 2017-04-30 DIAGNOSIS — R928 Other abnormal and inconclusive findings on diagnostic imaging of breast: Secondary | ICD-10-CM | POA: Diagnosis not present

## 2017-04-30 DIAGNOSIS — N6489 Other specified disorders of breast: Secondary | ICD-10-CM | POA: Diagnosis not present

## 2017-05-07 ENCOUNTER — Encounter (HOSPITAL_COMMUNITY): Payer: Medicare HMO

## 2017-05-23 DIAGNOSIS — C44519 Basal cell carcinoma of skin of other part of trunk: Secondary | ICD-10-CM | POA: Diagnosis not present

## 2017-06-03 ENCOUNTER — Other Ambulatory Visit (HOSPITAL_COMMUNITY): Payer: Self-pay | Admitting: Family Medicine

## 2017-06-03 ENCOUNTER — Ambulatory Visit (HOSPITAL_COMMUNITY)
Admission: RE | Admit: 2017-06-03 | Discharge: 2017-06-03 | Disposition: A | Discharge status: Home / Self Care | Payer: Medicare HMO | Source: Ambulatory Visit | Attending: Family Medicine | Admitting: Family Medicine

## 2017-06-03 DIAGNOSIS — R05 Cough: Secondary | ICD-10-CM | POA: Diagnosis not present

## 2017-06-03 DIAGNOSIS — J069 Acute upper respiratory infection, unspecified: Secondary | ICD-10-CM

## 2017-06-03 DIAGNOSIS — Z6830 Body mass index (BMI) 30.0-30.9, adult: Secondary | ICD-10-CM | POA: Diagnosis not present

## 2017-06-03 DIAGNOSIS — E6609 Other obesity due to excess calories: Secondary | ICD-10-CM | POA: Diagnosis not present

## 2017-07-11 ENCOUNTER — Encounter: Payer: Self-pay | Admitting: Internal Medicine

## 2017-07-24 ENCOUNTER — Other Ambulatory Visit (HOSPITAL_COMMUNITY): Payer: Self-pay | Admitting: Family Medicine

## 2017-07-24 DIAGNOSIS — E2839 Other primary ovarian failure: Secondary | ICD-10-CM

## 2017-10-03 ENCOUNTER — Ambulatory Visit (INDEPENDENT_AMBULATORY_CARE_PROVIDER_SITE_OTHER): Payer: Self-pay

## 2017-10-03 DIAGNOSIS — Z86010 Personal history of colonic polyps: Secondary | ICD-10-CM

## 2017-10-03 MED ORDER — PEG 3350-KCL-NA BICARB-NACL 420 G PO SOLR: 4000.0000 mL | ORAL | 0 refills | Status: DC

## 2017-10-03 NOTE — Progress Notes (Signed)
Gastroenterology Pre-Procedure Review  Request Date:10/03/17 Requesting Physician: 3 year recall last tcs RMR- 09/08/14 tubular adenoma  PATIENT REVIEW QUESTIONS: The patient responded to the following health history questions as indicated:    1. Diabetes Melitis: no 2. Joint replacements in the past 12 months: no 3. Major health problems in the past 3 months: no 4. Has an artificial valve or MVP: no 5. Has a defibrillator: no 6. Has been advised in past to take antibiotics in advance of a procedure like teeth cleaning: no 7. Family history of colon cancer: no  8. Alcohol Use: no 9. History of sleep apnea: no  10. History of coronary artery or other vascular stents placed within the last 12 months: no 11. History of any prior anesthesia complications: no    MEDICATIONS & ALLERGIES:    Patient reports the following regarding taking any blood thinners:   Plavix? no Aspirin? no Coumadin? no Brilinta? no Xarelto? no Eliquis? no Pradaxa? no Savaysa? no Effient? no  Patient confirms/reports the following medications:  Current Outpatient Medications  Medication Sig Dispense Refill  . CALCIUM-VITAMIN D PO Take 1 tablet by mouth daily.    Marland Kitchen FLUoxetine (PROZAC) 10 MG tablet Take 10 mg by mouth daily.    . fluticasone (FLONASE) 50 MCG/ACT nasal spray Place 2 sprays into both nostrils daily.     Marland Kitchen omeprazole (PRILOSEC) 20 MG capsule Take 20 mg by mouth daily.       No current facility-administered medications for this visit.     Patient confirms/reports the following allergies:  Allergies  Allergen Reactions  . Alendronate Sodium Other (See Comments)    Muscle pain   . Aspirin Nausea Only and Other (See Comments)    Abdominal Pain  . Celecoxib Nausea Only    Abdominal Pain  . Ezetimibe-Simvastatin Other (See Comments)    Muscle pain  . Rofecoxib     REACTION: Unknown reaction    No orders of the defined types were placed in this encounter.   AUTHORIZATION  INFORMATION Primary Insurance: Bernadene Person,  ID #: [id number] Pre-Cert / Auth required: no   SCHEDULE INFORMATION: Procedure has been scheduled as follows:  Date: 11/20/17, Time: 10:30 Location: [facility]  This Gastroenterology Pre-Precedure Review Form is being routed to the following provider(s): AB

## 2017-10-03 NOTE — Patient Instructions (Signed)
Patricia Hodges   [birth year] MRN: [id number]    Procedure Date: 11/20/17 Time to register: 9:30am Place to register: [facility] Procedure Time: 10:30am Scheduled provider: R. Garfield Cornea, MD  PREPARATION FOR COLONOSCOPY WITH TRI-LYTE SPLIT PREP  Please notify us immediately if you are diabetic, take iron supplements, or if you are on Coumadin or any other blood thinners.     You will need to purchase 1 fleet enema and 1 box of Bisacodyl 78m tablets.   2 DAYS BEFORE PROCEDURE:  DATE: 11/18/17  DAY: Monday Begin clear liquid diet AFTER your lunch meal. NO SOLID FOODS after this point.  1 DAY BEFORE PROCEDURE:  DATE: 11/19/17   DAY: Tuesday Continue clear liquids the entire day - NO SOLID FOOD.     At 2:00 pm:  Take 2 Bisacodyl tablets.   At 4:00pm:  Start drinking your solution. Make sure you mix well per instructions on the bottle. Try to drink 1 (one) 8 ounce glass every 10-15 minutes until you have consumed HALF the jug. You should complete by 6:00pm.You must keep the left over solution refrigerated until completed next day.  Continue clear liquids. You must drink plenty of clear liquids to prevent dehyration and kidney failure.     DAY OF PROCEDURE:   DATE: 11/20/17   DAY: Wednesday If you take medications for your heart, blood pressure or breathing, you may take these medications.    Five hours before your procedure time @ 5:30am:  Finish remaining amout of bowel prep, drinking 1 (one) 8 ounce glass every 10-15 minutes until complete. You have two hours to consume remaining prep.   Three hours before your procedure time _0 :30am:  Nothing by mouth.   At least one hour before going to the hospital:  Give yourself one Fleet enema. You may take your morning medications with sip of water unless we have instructed otherwise.      Please see below for Dietary Information.  CLEAR LIQUIDS INCLUDE:  Water Jello (NOT red in color)   Ice Popsicles (NOT red in  color)   Tea (sugar ok, no milk/cream) Powdered fruit flavored drinks  Coffee (sugar ok, no milk/cream) Gatorade/ Lemonade/ Kool-Aid  (NOT red in color)   Juice: apple, white grape, white cranberry Soft drinks  Clear bullion, consomme, broth (fat free beef/chicken/vegetable)  Carbonated beverages (any kind)  Strained chicken noodle soup Hard Candy   Remember: Clear liquids are liquids that will allow you to see your fingers on the other side of a clear glass. Be sure liquids are NOT red in color, and not cloudy, but CLEAR.  DO NOT EAT OR DRINK ANY OF THE FOLLOWING:  Dairy products of any kind   Cranberry juice Tomato juice / V8 juice   Grapefruit juice Orange juice     Red grape juice  Do not eat any solid foods, including such foods as: cereal, oatmeal, yogurt, fruits, vegetables, creamed soups, eggs, bread, crackers, pureed foods in a blender, etc.   HELPFUL HINTS FOR DRINKING PREP SOLUTION:   Make sure prep is extremely cold. Mix and refrigerate the the morning of the prep. You may also put in the freezer.   You may try mixing some Crystal Light or Country Time Lemonade if you prefer. Mix in small amounts; add more if necessary.  Try drinking through a straw  Rinse mouth with water or a mouthwash between glasses, to remove after-taste.  Try sipping on a cold beverage /ice/ popsicles between glasses  of prep.  Place a piece of sugar-free hard candy in mouth between glasses.  If you become nauseated, try consuming smaller amounts, or stretch out the time between glasses. Stop for 30-60 minutes, then slowly start back drinking.        OTHER INSTRUCTIONS  You will need a responsible adult at least 74 years of age to accompany you and drive you home. This person must remain in the waiting room during your procedure. The hospital will cancel your procedure if you do not have a responsible adult with you.   1. Wear loose fitting clothing that is easily removed. 2. Leave jewelry  and other valuables at home.  3. Remove all body piercing jewelry and leave at home. 4. Total time from sign-in until discharge is approximately 2-3 hours. 5. You should go home directly after your procedure and rest. You can resume normal activities the day after your procedure. 6. The day of your procedure you should not:  Drive  Make legal decisions  Operate machinery  Drink alcohol  Return to work   You may call the office (Dept: [phone]) before 5:00pm, or page the doctor on call [phone]) after 5:00pm, for further instructions, if necessary.   Insurance Information YOU WILL NEED TO CHECK WITH YOUR INSURANCE COMPANY FOR THE BENEFITS OF COVERAGE YOU HAVE FOR THIS PROCEDURE.  UNFORTUNATELY, NOT ALL INSURANCE COMPANIES HAVE BENEFITS TO COVER ALL OR PART OF THESE TYPES OF PROCEDURES.  IT IS YOUR RESPONSIBILITY TO CHECK YOUR BENEFITS, HOWEVER, WE WILL BE GLAD TO ASSIST YOU WITH ANY CODES YOUR INSURANCE COMPANY MAY NEED.    PLEASE NOTE THAT MOST INSURANCE COMPANIES WILL NOT COVER A SCREENING COLONOSCOPY FOR PEOPLE UNDER THE AGE OF 50  IF YOU HAVE BC[employer] INSURANCE, YOU MAY HAVE BENEFITS FOR A SCREENING COLONOSCOPY BUT IF POLYPS ARE FOUND THE DIAGNOSIS WILL CHANGE AND THEN YOU MAY HAVE A DEDUCTIBLE THAT WILL NEED TO BE MET. SO PLEASE MAKE SURE YOU CHECK YOUR BENEFITS FOR A SCREENING COLONOSCOPY AS WELL AS A DIAGNOSTIC COLONOSCOPY.

## 2017-10-03 NOTE — Progress Notes (Signed)
Appropriate.

## 2017-11-20 ENCOUNTER — Ambulatory Visit (HOSPITAL_COMMUNITY)
Admission: RE | Admit: 2017-11-20 | Discharge: 2017-11-20 | Disposition: A | Discharge status: Home / Self Care | Payer: Medicare HMO | Source: Ambulatory Visit | Attending: Internal Medicine | Admitting: Internal Medicine

## 2017-11-20 ENCOUNTER — Other Ambulatory Visit: Payer: Self-pay

## 2017-11-20 ENCOUNTER — Encounter (HOSPITAL_COMMUNITY)
Admission: RE | Disposition: A | Discharge status: Home / Self Care | Payer: Self-pay | Source: Ambulatory Visit | Attending: Internal Medicine

## 2017-11-20 ENCOUNTER — Encounter (HOSPITAL_COMMUNITY): Payer: Self-pay | Admitting: *Deleted

## 2017-11-20 DIAGNOSIS — E7800 Pure hypercholesterolemia, unspecified: Secondary | ICD-10-CM | POA: Diagnosis not present

## 2017-11-20 DIAGNOSIS — D122 Benign neoplasm of ascending colon: Secondary | ICD-10-CM | POA: Diagnosis not present

## 2017-11-20 DIAGNOSIS — D124 Benign neoplasm of descending colon: Secondary | ICD-10-CM | POA: Diagnosis not present

## 2017-11-20 DIAGNOSIS — Z1211 Encounter for screening for malignant neoplasm of colon: Secondary | ICD-10-CM | POA: Insufficient documentation

## 2017-11-20 DIAGNOSIS — Z87891 Personal history of nicotine dependence: Secondary | ICD-10-CM | POA: Insufficient documentation

## 2017-11-20 DIAGNOSIS — Z79899 Other long term (current) drug therapy: Secondary | ICD-10-CM | POA: Diagnosis not present

## 2017-11-20 DIAGNOSIS — Z886 Allergy status to analgesic agent status: Secondary | ICD-10-CM | POA: Diagnosis not present

## 2017-11-20 DIAGNOSIS — K5730 Diverticulosis of large intestine without perforation or abscess without bleeding: Secondary | ICD-10-CM

## 2017-11-20 DIAGNOSIS — K219 Gastro-esophageal reflux disease without esophagitis: Secondary | ICD-10-CM | POA: Insufficient documentation

## 2017-11-20 DIAGNOSIS — Z888 Allergy status to other drugs, medicaments and biological substances status: Secondary | ICD-10-CM | POA: Diagnosis not present

## 2017-11-20 DIAGNOSIS — M1990 Unspecified osteoarthritis, unspecified site: Secondary | ICD-10-CM | POA: Insufficient documentation

## 2017-11-20 DIAGNOSIS — Z86010 Personal history of colonic polyps: Secondary | ICD-10-CM | POA: Diagnosis not present

## 2017-11-20 HISTORY — PX: POLYPECTOMY: SHX5525

## 2017-11-20 HISTORY — PX: COLONOSCOPY: SHX5424

## 2017-11-20 SURGERY — COLONOSCOPY
Anesthesia: Moderate Sedation

## 2017-11-20 MED ORDER — MIDAZOLAM HCL 5 MG/5ML IJ SOLN
INTRAMUSCULAR | Status: DC | PRN
  Administered 2017-11-20: 1 mg via INTRAVENOUS
  Administered 2017-11-20: 2 mg via INTRAVENOUS
  Administered 2017-11-20 (×2): 1 mg via INTRAVENOUS
  Administered 2017-11-20: 2 mg via INTRAVENOUS
  Administered 2017-11-20: 1 mg via INTRAVENOUS

## 2017-11-20 MED ORDER — MEPERIDINE HCL 50 MG/ML IJ SOLN
INTRAMUSCULAR | Status: AC
  Filled 2017-11-20: qty 1

## 2017-11-20 MED ORDER — ONDANSETRON HCL 4 MG/2ML IJ SOLN
INTRAMUSCULAR | Status: DC
Start: 2017-11-20 — End: 2017-11-20
  Filled 2017-11-20: qty 2

## 2017-11-20 MED ORDER — MIDAZOLAM HCL 5 MG/5ML IJ SOLN
INTRAMUSCULAR | Status: AC
  Filled 2017-11-20: qty 5

## 2017-11-20 MED ORDER — ONDANSETRON HCL 4 MG/2ML IJ SOLN
INTRAMUSCULAR | Status: DC | PRN
  Administered 2017-11-20: 4 mg via INTRAVENOUS

## 2017-11-20 MED ORDER — SODIUM CHLORIDE 0.9 % IV SOLN
INTRAVENOUS | Status: DC
  Administered 2017-11-20: 10:00:00 via INTRAVENOUS

## 2017-11-20 MED ORDER — MEPERIDINE HCL 100 MG/ML IJ SOLN
INTRAMUSCULAR | Status: DC | PRN
  Administered 2017-11-20 (×2): 25 mg via INTRAVENOUS

## 2017-11-20 NOTE — Op Note (Signed)
[facility] Patient Name: Patricia Hodges Procedure Date: 11/20/2017 10:16 AM MRN: [id number] Date of Birth: 1945-09-13 Attending MD: Norvel Richards , MD CSN: [id number] Age: 72 Admit Type: Outpatient Procedure:                Colonoscopy Indications:              High risk colon cancer surveillance: Personal                            history of colonic polyps Providers:                Norvel Richards, MD, Charlsie Quest. Theda Sers RN, RN,                            Aram Candela Referring MD:              Medicines:                Midazolam 8 mg IV, Meperidine 50 mg IV, Ondansetron                            4 mg IV Complications:            No immediate complications. Estimated Blood Loss:     Estimated blood loss was minimal. Procedure:                Pre-Anesthesia Assessment:                           - Prior to the procedure, a History and Physical                            was performed, and patient medications and                            allergies were reviewed. The patient's tolerance of                            previous anesthesia was also reviewed. The risks                            and benefits of the procedure and the sedation                            options and risks were discussed with the patient.                            All questions were answered, and informed consent                            was obtained. Prior Anticoagulants: The patient has                            taken no previous anticoagulant or antiplatelet  agents. ASA Grade Assessment: II - A patient with                            mild systemic disease. After reviewing the risks                            and benefits, the patient was deemed in                            satisfactory condition to undergo the procedure.                           After obtaining informed consent, the colonoscope                            was passed under direct vision.  Throughout the                            procedure, the patient's blood pressure, pulse, and                            oxygen saturations were monitored continuously. The                            CF-HQ190L ([id number]) scope was introduced through                            the anus and advanced to the the cecum, identified                            by appendiceal orifice and ileocecal valve. The                            colonoscopy was performed without difficulty. The                            patient tolerated the procedure well. The quality                            of the bowel preparation was adequate. The                            ileocecal valve, appendiceal orifice, and rectum                            were photographed. The entire colon was well                            visualized. The quality of the bowel preparation                            was adequate. Scope In: 10:40:27 AM Scope Out: 11:03:30 AM Scope Withdrawal Time: 0 hours 11 minutes 14 seconds  Total Procedure Duration: 0  hours 23 minutes 3 seconds  Findings:      The perianal and digital rectal examinations were normal.      Scattered small and large-mouthed diverticula were found in the entire       colon. Rectal mucosa seen well. Rectal vault small. Unable to retroflex.      Two sessile polyps were found in the descending colon and ascending       colon. The polyps were 5 to 6 mm in size. These polyps were removed with       a cold snare. Resection and retrieval were complete. Estimated blood       loss was minimal.      The exam was otherwise without abnormality on direct and retroflexion       views. Impression:               - Diverticulosis in the entire examined colon.                           - Two 5 to 6 mm polyps in the descending colon and                            in the ascending colon, removed with a cold snare.                            Resected and retrieved.                            - The examination was otherwise normal on direct                            and retroflexion views. Moderate Sedation:      Moderate (conscious) sedation was administered by the endoscopy nurse       and supervised by the endoscopist. The following parameters were       monitored: oxygen saturation, heart rate, blood pressure, respiratory       rate, EKG, adequacy of pulmonary ventilation, and response to care.       Total physician intraservice time was 28 minutes. Recommendation:           - Patient has a contact number available for                            emergencies. The signs and symptoms of potential                            delayed complications were discussed with the                            patient. Return to normal activities tomorrow.                            Written discharge instructions were provided to the                            patient.                           -  Resume previous diet.                           - Repeat colonoscopy date to be determined after                            pending pathology results are reviewed for                            surveillance based on pathology results.                           - Return to GI office (date not yet determined). Procedure Code(s):        --- Professional ---                           [phone], Colonoscopy, flexible; with removal of                            tumor(s), polyp(s), or other lesion(s) by snare                            technique                           G0500, Moderate sedation services provided by the                            same physician or other qualified health care                            professional performing a gastrointestinal                            endoscopic service that sedation supports,                            requiring the presence of an independent trained                            observer to assist in the monitoring of the                             patient's level of consciousness and physiological                            status; initial 15 minutes of intra-service time;                            patient age 26 years or older (additional time may                            be reported with [phone], as appropriate)  99153, Moderate sedation services provided by the                            same physician or other qualified health care                            professional performing the diagnostic or                            therapeutic service that the sedation supports,                            requiring the presence of an independent trained                            observer to assist in the monitoring of the                            patient's level of consciousness and physiological                            status; each additional 15 minutes intraservice                            time (List separately in addition to code for                            primary service) Diagnosis Code(s):        --- Professional ---                           Z86.010, Personal history of colonic polyps                           D12.4, Benign neoplasm of descending colon                           D12.2, Benign neoplasm of ascending colon                           K57.30, Diverticulosis of large intestine without                            perforation or abscess without bleeding CPT copyright 2017 American Medical Association. All rights reserved. The codes documented in this report are preliminary and upon coder review may  be revised to meet current compliance requirements. Cristopher Estimable. Brandis Matsuura, MD Norvel Richards, MD 11/20/2017 11:10:50 AM This report has been signed electronically. Number of Addenda: 0

## 2017-11-20 NOTE — Discharge Instructions (Signed)
Colonoscopy Discharge Instructions  Read the instructions outlined below and refer to this sheet in the next few weeks. These discharge instructions provide you with general information on caring for yourself after you leave the hospital. Your doctor may also give you specific instructions. While your treatment has been planned according to the most current medical practices available, unavoidable complications occasionally occur. If you have any problems or questions after discharge, call Dr. Gala Romney at [phone]. ACTIVITY  You may resume your regular activity, but move at a slower pace for the next 24 hours.   Take frequent rest periods for the next 24 hours.   Walking will help get rid of the air and reduce the bloated feeling in your belly (abdomen).   No driving for 24 hours (because of the medicine (anesthesia) used during the test).    Do not sign any important legal documents or operate any machinery for 24 hours (because of the anesthesia used during the test).  NUTRITION  Drink plenty of fluids.   You may resume your normal diet as instructed by your doctor.   Begin with a light meal and progress to your normal diet. Heavy or fried foods are harder to digest and may make you feel sick to your stomach (nauseated).   Avoid alcoholic beverages for 24 hours or as instructed.  MEDICATIONS  You may resume your normal medications unless your doctor tells you otherwise.  WHAT YOU CAN EXPECT TODAY  Some feelings of bloating in the abdomen.   Passage of more gas than usual.   Spotting of blood in your stool or on the toilet paper.  IF YOU HAD POLYPS REMOVED DURING THE COLONOSCOPY:  No aspirin products for 7 days or as instructed.   No alcohol for 7 days or as instructed.   Eat a soft diet for the next 24 hours.  FINDING OUT THE RESULTS OF YOUR TEST Not all test results are available during your visit. If your test results are not back during the visit, make an appointment  with your caregiver to find out the results. Do not assume everything is normal if you have not heard from your caregiver or the medical facility. It is important for you to follow up on all of your test results.  SEEK IMMEDIATE MEDICAL ATTENTION IF:  You have more than a spotting of blood in your stool.   Your belly is swollen (abdominal distention).   You are nauseated or vomiting.   You have a temperature over 101.   You have abdominal pain or discomfort that is severe or gets worse throughout the day.    Colon polyp and diverticulosis information provided  Further recommendations to follow pending review of pathology     Diverticulosis Diverticulosis is a condition that develops when small pouches (diverticula) form in the wall of the large intestine (colon). The colon is where water is absorbed and stool is formed. The pouches form when the inside layer of the colon pushes through weak spots in the outer layers of the colon. You may have a few pouches or many of them. What are the causes? The cause of this condition is not known. What increases the risk? The following factors may make you more likely to develop this condition:  Being older than age 69. Your risk for this condition increases with age. Diverticulosis is rare among people younger than age 91. By age 45, many people have it.  Eating a low-fiber diet.  Having frequent constipation.  Being  overweight.  Not getting enough exercise.  Smoking.  Taking over-the-counter pain medicines, like aspirin and ibuprofen.  Having a family history of diverticulosis.  What are the signs or symptoms? In most people, there are no symptoms of this condition. If you do have symptoms, they may include:  Bloating.  Cramps in the abdomen.  Constipation or diarrhea.  Pain in the lower left side of the abdomen.  How is this diagnosed? This condition is most often diagnosed during an exam for other colon problems.  Because diverticulosis usually has no symptoms, it often cannot be diagnosed independently. This condition may be diagnosed by:  Using a flexible scope to examine the colon (colonoscopy).  Taking an X-ray of the colon after dye has been put into the colon (barium enema).  Doing a CT scan.  How is this treated? You may not need treatment for this condition if you have never developed an infection related to diverticulosis. If you have had an infection before, treatment may include:  Eating a high-fiber diet. This may include eating more fruits, vegetables, and grains.  Taking a fiber supplement.  Taking a live bacteria supplement (probiotic).  Taking medicine to relax your colon.  Taking antibiotic medicines.  Follow these instructions at home:  Drink 6-8 glasses of water or more each day to prevent constipation.  Try not to strain when you have a bowel movement.  If you have had an infection before: ? Eat more fiber as directed by your health care provider or your diet and nutrition specialist (dietitian). ? Take a fiber supplement or probiotic, if your health care provider approves.  Take over-the-counter and prescription medicines only as told by your health care provider.  If you were prescribed an antibiotic, take it as told by your health care provider. Do not stop taking the antibiotic even if you start to feel better.  Keep all follow-up visits as told by your health care provider. This is important. Contact a health care provider if:  You have pain in your abdomen.  You have bloating.  You have cramps.  You have not had a bowel movement in 3 days. Get help right away if:  Your pain gets worse.  Your bloating becomes very bad.  You have a fever or chills, and your symptoms suddenly get worse.  You vomit.  You have bowel movements that are bloody or black.  You have bleeding from your rectum. Summary  Diverticulosis is a condition that develops when  small pouches (diverticula) form in the wall of the large intestine (colon).  You may have a few pouches or many of them.  This condition is most often diagnosed during an exam for other colon problems.  If you have had an infection related to diverticulosis, treatment may include increasing the fiber in your diet, taking supplements, or taking medicines. This information is not intended to replace advice given to you by your health care provider. Make sure you discuss any questions you have with your health care provider. Document Released: 12/01/2003 Document Revised: 01/23/2016 Document Reviewed: 01/23/2016 Elsevier Interactive Patient Education  2017 [employer].    Colon Polyps Polyps are tissue growths inside the body. Polyps can grow in many places, including the large intestine (colon). A polyp may be a round bump or a mushroom-shaped growth. You could have one polyp or several. Most colon polyps are noncancerous (benign). However, some colon polyps can become cancerous over time. What are the causes? The exact cause of colon polyps  is not known. What increases the risk? This condition is more likely to develop in people who:  Have a family history of colon cancer or colon polyps.  Are older than 66 or older than 45 if they are African American.  Have inflammatory bowel disease, such as ulcerative colitis or Crohn disease.  Are overweight.  Smoke cigarettes.  Do not get enough exercise.  Drink too much alcohol.  Eat a diet that is: ? High in fat and red meat. ? Low in fiber.  Had childhood cancer that was treated with abdominal radiation.  What are the signs or symptoms? Most polyps do not cause symptoms. If you have symptoms, they may include:  Blood coming from your rectum when having a bowel movement.  Blood in your stool.The stool may look dark red or black.  A change in bowel habits, such as constipation or diarrhea.  How is this diagnosed? This  condition is diagnosed with a colonoscopy. This is a procedure that uses a lighted, flexible scope to look at the inside of your colon. How is this treated? Treatment for this condition involves removing any polyps that are found. Those polyps will then be tested for cancer. If cancer is found, your health care provider will talk to you about options for colon cancer treatment. Follow these instructions at home: Diet  Eat plenty of fiber, such as fruits, vegetables, and whole grains.  Eat foods that are high in calcium and vitamin D, such as milk, cheese, yogurt, eggs, liver, fish, and broccoli.  Limit foods high in fat, red meats, and processed meats, such as hot dogs, sausage, bacon, and lunch meats.  Maintain a healthy weight, or lose weight if recommended by your health care provider. General instructions  Do not smoke cigarettes.  Do not drink alcohol excessively.  Keep all follow-up visits as told by your health care provider. This is important. This includes keeping regularly scheduled colonoscopies. Talk to your health care provider about when you need a colonoscopy.  Exercise every day or as told by your health care provider. Contact a health care provider if:  You have new or worsening bleeding during a bowel movement.  You have new or increased blood in your stool.  You have a change in bowel habits.  You unexpectedly lose weight. This information is not intended to replace advice given to you by your health care provider. Make sure you discuss any questions you have with your health care provider. Document Released: 11/30/2003 Document Revised: 08/11/2015 Document Reviewed: 01/24/2015 Elsevier Interactive Patient Education  [employer].

## 2017-11-20 NOTE — H&P (Signed)
@LOGO @   Primary Care Physician:  Sharilyn Sites, MD Primary Gastroenterologist:  Dr. Gala Romney  Pre-Procedure History & Physical: HPI:  Patricia Hodges is a 72 y.o. female here for surveillance colonoscopy. History of colonic adenomas-multiple removed in 2016.  Past Medical History:  Diagnosis Date  . Diverticulosis   . Dysphagia   . Esophageal reflux   . Gastroenteritis, eosinophilic   . H pylori ulcer   . High cholesterol   . Osteoarthritis   . S/P colonoscopy 2006   diverticulosis, due for repeat in 2016  . S/P endoscopy 2007   Schatzki's Ring    Past Surgical History:  Procedure Laterality Date  . ANTERIOR CERVICAL DECOMP/DISCECTOMY FUSION N/A 01/30/2016   Procedure: C5-6 Anterior Cervical Discectomy and Fusion, Allograft, Plate;  Surgeon: [name], MD;  Location: [facility];  Service: Orthopedics;  Laterality: N/A;  . bladder tack    . BUNIONECTOMY Right   . CARPAL TUNNEL RELEASE Left   . CATARACT EXTRACTION W/PHACO Right 11/28/2015   Procedure: CATARACT EXTRACTION PHACO AND INTRAOCULAR LENS PLACEMENT ([place]) RIGHT;  Surgeon: Tonny Branch, MD;  Location: [facility];  Service: Ophthalmology;  Laterality: Right;  CDE: 6.96  . CATARACT EXTRACTION W/PHACO Left 12/15/2015   Procedure: CATARACT EXTRACTION PHACO AND INTRAOCULAR LENS PLACEMENT LEFT EYE CDE=9.04;  Surgeon: Tonny Branch, MD;  Location: [facility];  Service: Ophthalmology;  Laterality: Left;  left  . CESAREAN SECTION    . CHOLECYSTECTOMY    . COLONOSCOPY  2006  . COLONOSCOPY N/A 09/08/2014   Procedure: COLONOSCOPY;  Surgeon: Daneil Dolin, MD;  Location: [facility];  Service: Endoscopy;  Laterality: N/A;  10:30 AM  . feet surgery    . HAND SURGERY Right    on ring finger due to swollen knuckle  . Hx schatski's ring    . S/P Hysterectomy      Prior to Admission medications   Medication Sig Start Date End Date Taking? Authorizing Provider  CALCIUM-VITAMIN D PO Take 1 tablet by mouth daily.   Yes [provider]   FLUoxetine (PROZAC) 10 MG tablet Take 10 mg by mouth every other day.    Yes [provider]  omeprazole (PRILOSEC OTC) 20 MG tablet Take 20 mg by mouth daily.   Yes [provider]  polyethylene glycol-electrolytes (TRILYTE) 420 g solution Take 4,000 mLs by mouth as directed. 10/03/17  Yes Annitta Needs, NP    Allergies as of 10/03/2017 - Review Complete 10/03/2017  Allergen Reaction Noted  . Alendronate sodium Other (See Comments)   . Aspirin Nausea Only and Other (See Comments)   . Celecoxib Nausea Only   . Ezetimibe-simvastatin Other (See Comments)   . Rofecoxib      Family History  Problem Relation Age of Onset  . Colon cancer Neg Hx     Social History   Socioeconomic History  . Marital status: Married    Spouse name: Not on file  . Number of children: Not on file  . Years of education: Not on file  . Highest education level: Not on file  Occupational History  . Not on file  Social Needs  . Financial resource strain: Not on file  . Food insecurity:    Worry: Not on file    Inability: Not on file  . Transportation needs:    Medical: Not on file    Non-medical: Not on file  Tobacco Use  . Smoking status: Former Smoker    Packs/day: 1.00  Years: 20.00    Pack years: 20.00    Types: Cigarettes    Last attempt to quit: 11/23/1978    Years since quitting: 39.0  . Smokeless tobacco: Never Used  . Tobacco comment: quit about 30 yrs ago  Substance and Sexual Activity  . Alcohol use: No  . Drug use: No  . Sexual activity: Not on file  Lifestyle  . Physical activity:    Days per week: Not on file    Minutes per session: Not on file  . Stress: Not on file  Relationships  . Social connections:    Talks on phone: Not on file    Gets together: Not on file    Attends religious service: Not on file    Active member of club or organization: Not on file    Attends meetings of clubs or organizations: Not on file    Relationship status: Not on file   . Intimate partner violence:    Fear of current or ex partner: Not on file    Emotionally abused: Not on file    Physically abused: Not on file    Forced sexual activity: Not on file  Other Topics Concern  . Not on file  Social History Narrative  . Not on file    Review of Systems: See HPI, otherwise negative ROS  Physical Exam: BP (!) 147/76   Pulse (!) 57   Temp 98.3 F (36.8 C) (Oral)   Resp 19   Ht 5\' 4"  (1.626 m)   Wt 81.2 kg   SpO2 95%   BMI 30.73 kg/m  General:   Alert,  Well-developed, well-nourished, pleasant and cooperative in NAD Neck:  Supple; no masses or thyromegaly. No significant cervical adenopathy. Lungs:  Clear throughout to auscultation.   No wheezes, crackles, or rhonchi. No acute distress. Heart:  Regular rate and rhythm; no murmurs, clicks, rubs,  or gallops. Abdomen: Non-distended, normal bowel sounds.  Soft and nontender without appreciable mass or hepatosplenomegaly.  Pulses:  Normal pulses noted. Extremities:  Without clubbing or edema.  Impression/Plan:  72 year old lady here for surveillance colonoscopy.  The risks, benefits, limitations, alternatives and imponderables have been reviewed with the patient. Questions have been answered. All parties are agreeable.      Notice: This dictation was prepared with Dragon dictation along with smaller phrase technology. Any transcriptional errors that result from this process are unintentional and may not be corrected upon review.

## 2017-11-24 ENCOUNTER — Encounter: Payer: Self-pay | Admitting: Internal Medicine

## 2017-11-25 ENCOUNTER — Encounter (HOSPITAL_COMMUNITY): Payer: Self-pay | Admitting: Internal Medicine

## 2017-12-26 DIAGNOSIS — R69 Illness, unspecified: Secondary | ICD-10-CM | POA: Diagnosis not present

## 2018-01-02 ENCOUNTER — Encounter (INDEPENDENT_AMBULATORY_CARE_PROVIDER_SITE_OTHER): Payer: Self-pay | Admitting: Orthopaedic Surgery

## 2018-01-02 ENCOUNTER — Ambulatory Visit (INDEPENDENT_AMBULATORY_CARE_PROVIDER_SITE_OTHER): Payer: Medicare HMO

## 2018-01-02 ENCOUNTER — Ambulatory Visit (INDEPENDENT_AMBULATORY_CARE_PROVIDER_SITE_OTHER): Payer: Medicare HMO | Admitting: Orthopaedic Surgery

## 2018-01-02 VITALS — BP 160/92 | HR 61 | Ht 64.0 in | Wt 180.0 lb

## 2018-01-02 DIAGNOSIS — M25521 Pain in right elbow: Secondary | ICD-10-CM

## 2018-01-02 DIAGNOSIS — Z981 Arthrodesis status: Secondary | ICD-10-CM | POA: Diagnosis not present

## 2018-01-02 DIAGNOSIS — M7711 Lateral epicondylitis, right elbow: Secondary | ICD-10-CM

## 2018-01-02 DIAGNOSIS — M542 Cervicalgia: Secondary | ICD-10-CM

## 2018-01-02 MED ORDER — METHYLPREDNISOLONE ACETATE 40 MG/ML IJ SUSP
40.0000 mg | INTRAMUSCULAR | Status: AC | PRN
  Administered 2018-01-02: 40 mg via INTRA_ARTICULAR

## 2018-01-02 MED ORDER — LIDOCAINE HCL 1 % IJ SOLN
0.5000 mL | INTRAMUSCULAR | Status: AC | PRN
  Administered 2018-01-02: .5 mL

## 2018-01-02 MED ORDER — BUPIVACAINE HCL 0.5 % IJ SOLN
1.0000 mL | INTRAMUSCULAR | Status: AC | PRN
  Administered 2018-01-02: 1 mL via INTRA_ARTICULAR

## 2018-01-02 NOTE — Progress Notes (Signed)
Office Visit Note   Patient: Patricia Hodges           Date of Birth: 1945-04-06           MRN: [id number] Visit Date: 01/02/2018              Requested by: Sharilyn Sites, [address] [place], [place] [address] PCP: Sharilyn Sites, MD   Assessment & Plan: Visit Diagnoses:  1. Neck pain   2. Pain in right elbow   3. Lateral epicondylitis, right elbow   4. Status post cervical spinal fusion     Plan: Tennis elbow injection performed.  She can get a tennis elbow brace later today at the store.  We discussed pathophysiology.  Her neck x-rays show solid fusion.  It is possible that she may have developed some adjacent level changes but is only been 2 years since her fusion.  We will see how she does with the injection tennis elbow brace and office follow-up if she has persistent problems.  Discussed pathophysiology of lateral epicondylitis.  Follow-Up Instructions: No follow-ups on file.   Orders:  Orders Placed This Encounter  Procedures  . Medium Joint Inj  . XR Cervical Spine 2 or 3 views  . XR Elbow 2 Views Right   No orders of the defined types were placed in this encounter.     Procedures: Medium Joint Inj: R lateral epicondyle on 01/02/2018 11:54 AM Indications: pain Details: 25 G 1.5 in needle, anterolateral approach Medications: 1 mL bupivacaine 0.5 %; 40 mg methylPREDNISolone acetate 40 MG/ML; 0.5 mL lidocaine 1 % Outcome: tolerated well, no immediate complications Procedure, treatment alternatives, risks and benefits explained, specific risks discussed. Consent was given by the patient. Immediately prior to procedure a time out was called to verify the correct patient, procedure, equipment, support staff and site/side marked as required. Patient was prepped and draped in the usual sterile fashion.       Clinical Data: No additional findings.   Subjective: Chief Complaint  Patient presents with  . Neck - Pain  . Right Shoulder - Pain  . Left  Shoulder - Pain    HPI 72 year old female returns she still waiting tables and has had increased pain in both arms more so on the right than left with pain with gripping and squeezing points to the lateral epicondyle.  She has had carpal tunnel release done years ago on the left hand not on the right.  She had C5-6 cervical fusion 01/30/2016 and did well postoperatively.  She did take some anti-inflammatory short-term but recently was given some hydrocodone which she is been taking.  Review of Systems view of systems updated unchanged from 2017 cervical procedure other than as mentioned above in HPI.   Objective: Vital Signs: BP (!) 160/92   Pulse 61   Ht 5\' 4"  (1.626 m)   Wt 180 lb (81.6 kg)   BMI 30.90 kg/m   Physical Exam  Constitutional: She is oriented to person, place, and time. She appears well-developed.  HENT:  Head: Normocephalic.  Right Ear: External ear normal.  Left Ear: External ear normal.  Eyes: Pupils are equal, round, and reactive to light.  Neck: No tracheal deviation present. No thyromegaly present.  Cardiovascular: Normal rate.  Pulmonary/Chest: Effort normal.  Abdominal: Soft.  Neurological: She is alert and oriented to person, place, and time.  Skin: Skin is warm and dry.  Psychiatric: She has a normal mood and affect. Her behavior is normal.  Ortho Exam healed cervical incision.  She has some brachial plexus tenderness on the right negative on the left.  No pain with forward flexion of the neck upper extremity reflexes are 1+ and symmetrical.  Exquisite tenderness over the right lateral epicondyle negative on the left ulnar nerve median nerve is normal well-healed carpal tunnel incision on the left hand.  Mild discomfort with the carpal compression on the right.  Riddick changes worse in the index finger bilaterally PIP DIP joint.  Normal heel toe gait. Specialty Comments:  No specialty comments available.  Imaging: Xr Cervical Spine 2 Or 3  Views  Result Date: 01/02/2018 [facility] cervical spine x-ray and lateral flexion-extension x-rays obtained and reviewed.  This shows C5-6 fusion with good incorporation no motion on flexion-extension.  Mild narrowing C6-7 level noted. Impression: Solid C5-6 fusion without motion.  Narrowing C6-7 interspace.  Xr Elbow 2 Views Right  Result Date: 01/02/2018 [facility] lateral right elbow x-rays obtained and reviewed.  It is for acute changes no soft tissue calcification. Impression: Normal right elbow x-rays.    PMFS History: Patient Active Problem List   Diagnosis Date Noted  . Lumbar foraminal stenosis 09/06/2016  . Surgery, elective   . History of colonic polyps   . ABDOMINAL BLOATING 11/11/2009  . HELICOBACTER PYLORI GASTRITIS 11/17/2007  . SCHATZKI'S RING 11/17/2007  . GERD 11/17/2007  . HIATAL HERNIA 11/17/2007  . EOSINOPHILIC GASTROENTERITIS 41/32/4401  . Other spondylosis with radiculopathy, lumbar region 11/17/2007  . WEIGHT LOSS 11/17/2007  . NAUSEA 11/17/2007  . VOMITING 11/17/2007  . HEARTBURN 11/17/2007  . OTHER DYSPHAGIA 11/17/2007  . DIARRHEA 11/17/2007  . ABDOMINAL PAIN, HX OF 11/17/2007  . CHOLECYSTECTOMY, HX OF 11/17/2007   Past Medical History:  Diagnosis Date  . Diverticulosis   . Dysphagia   . Esophageal reflux   . Gastroenteritis, eosinophilic   . H pylori ulcer   . High cholesterol   . Osteoarthritis   . S/P colonoscopy 2006   diverticulosis, due for repeat in 2016  . S/P endoscopy 2007   Schatzki's Ring    Family History  Problem Relation Age of Onset  . Colon cancer Neg Hx     Past Surgical History:  Procedure Laterality Date  . ANTERIOR CERVICAL DECOMP/DISCECTOMY FUSION N/A 01/30/2016   Procedure: C5-6 Anterior Cervical Discectomy and Fusion, Allograft, Plate;  Surgeon: [name], MD;  Location: [place];  Service: Orthopedics;  Laterality: N/A;  . bladder tack    . BUNIONECTOMY Right   . CARPAL TUNNEL RELEASE Left   . CATARACT EXTRACTION W/PHACO  Right 11/28/2015   Procedure: CATARACT EXTRACTION PHACO AND INTRAOCULAR LENS PLACEMENT ([place]) RIGHT;  Surgeon: Tonny Branch, MD;  [facility];  Service: Ophthalmology;  Laterality: Right;  CDE: 6.96  . CATARACT EXTRACTION W/PHACO Left 12/15/2015   Procedure: CATARACT EXTRACTION PHACO AND INTRAOCULAR LENS PLACEMENT LEFT EYE CDE=9.04;  Surgeon: Tonny Branch, MD;  [facility];  Service: Ophthalmology;  Laterality: Left;  left  . CESAREAN SECTION    . CHOLECYSTECTOMY    . COLONOSCOPY  2006  . COLONOSCOPY N/A 09/08/2014   Procedure: COLONOSCOPY;  Surgeon: Daneil Dolin, MD;  [facility];  Service: Endoscopy;  Laterality: N/A;  10:30 AM  . COLONOSCOPY N/A 11/20/2017   Procedure: COLONOSCOPY;  Surgeon: Daneil Dolin, MD;  [facility];  Service: Endoscopy;  Laterality: N/A;  10:30  . feet surgery    . HAND SURGERY Right    on ring finger  due to swollen knuckle  . Hx schatski's ring    . POLYPECTOMY  11/20/2017   Procedure: POLYPECTOMY;  Surgeon: Daneil Dolin, MD;  [facility];  Service: Endoscopy;;  ascending colon (CSx1) descending colon(CSx1)  . S/P Hysterectomy     Social History   Occupational History  . Not on file  Tobacco Use  . Smoking status: Former Smoker    Packs/day: 1.00    Years: 20.00    Pack years: 20.00    Types: Cigarettes    Last attempt to quit: 11/23/1978    Years since quitting: 39.1  . Smokeless tobacco: Never Used  . Tobacco comment: quit about 30 yrs ago  Substance and Sexual Activity  . Alcohol use: No  . Drug use: No  . Sexual activity: Not on file

## 2018-04-05 IMAGING — DX DG CHEST 2V
2 series · 2 of 2 positions shown · non-contrast
Comparison: 01/27/2016

CLINICAL DATA: Flu 1 month ago.  Coughing congestion.

EXAM:
CHEST - 2 VIEW

[chest pa]
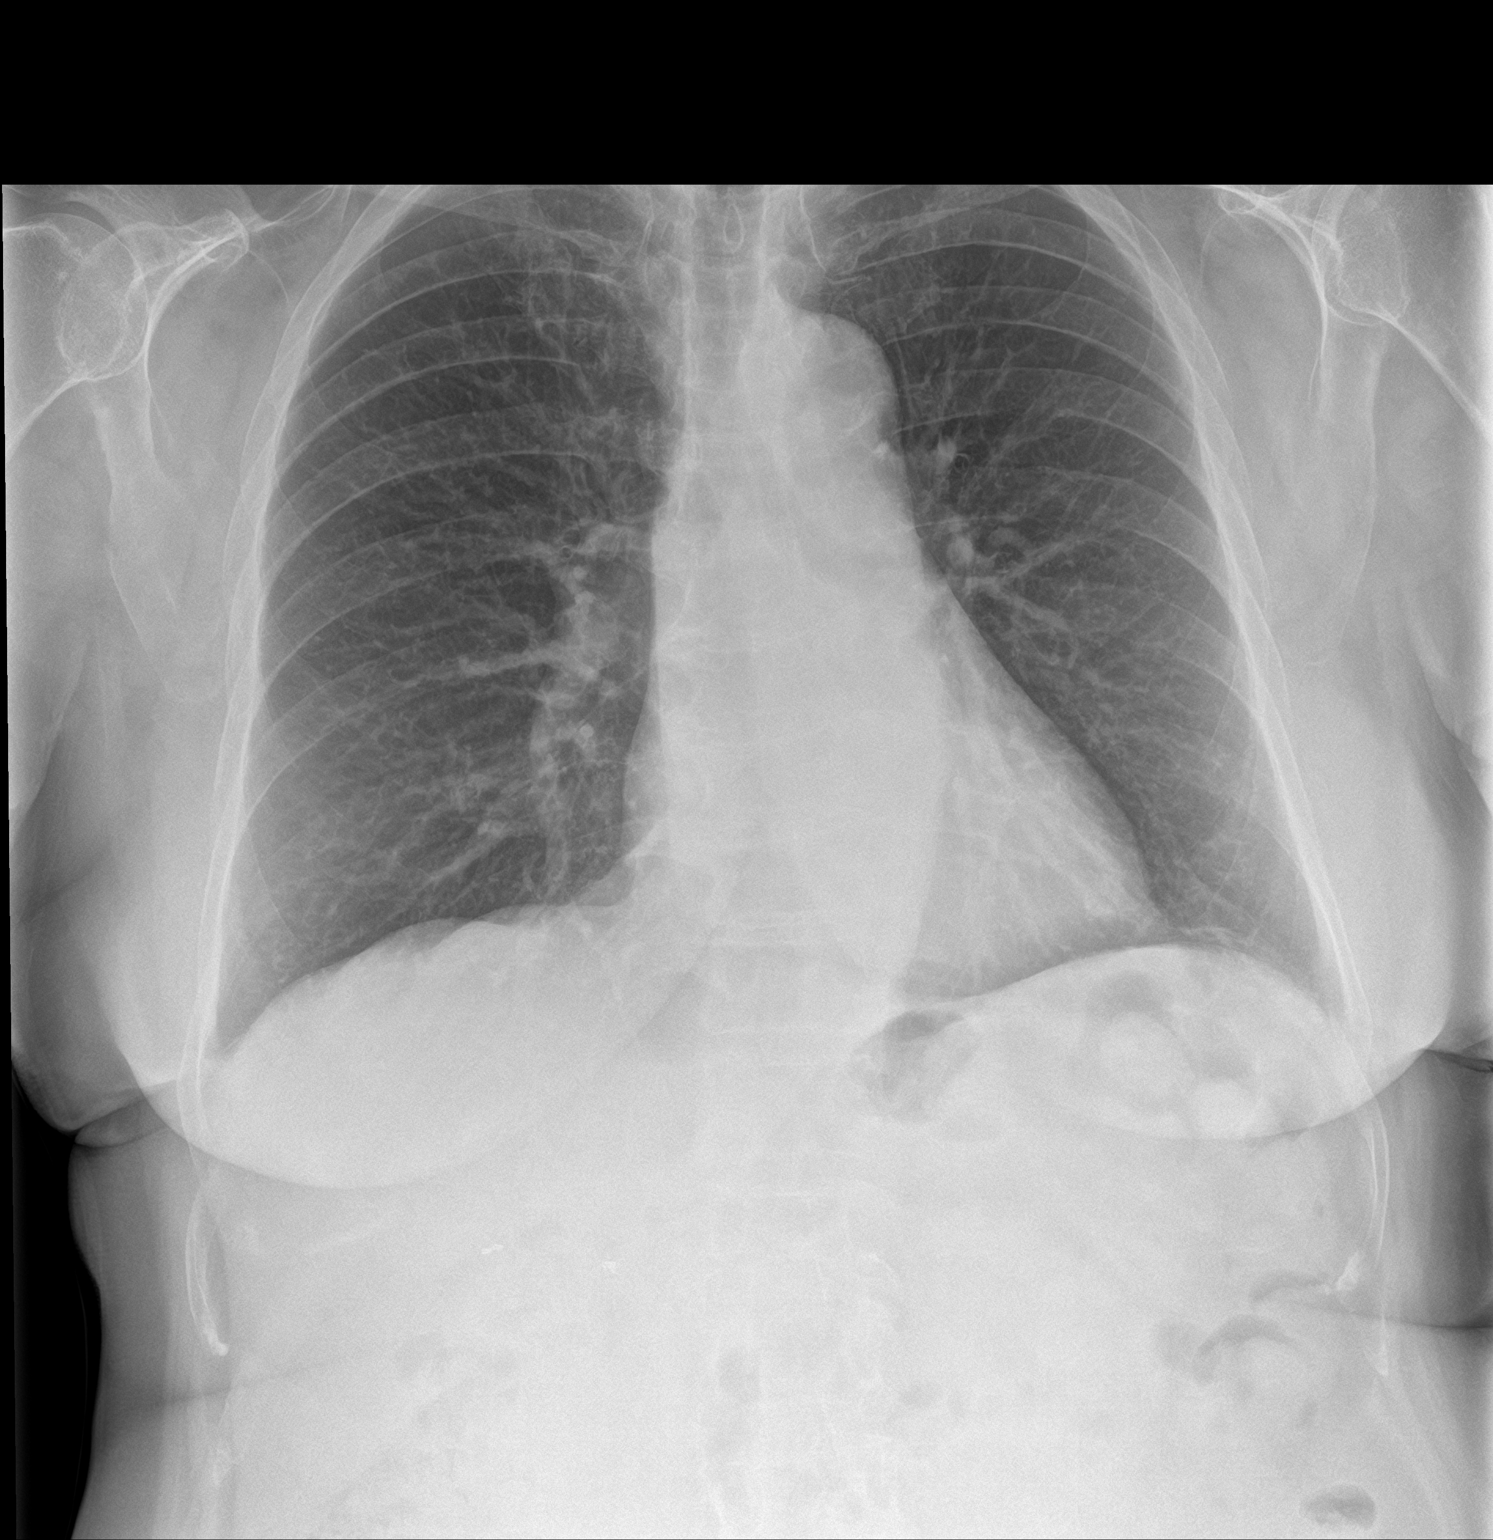

[chest lat]
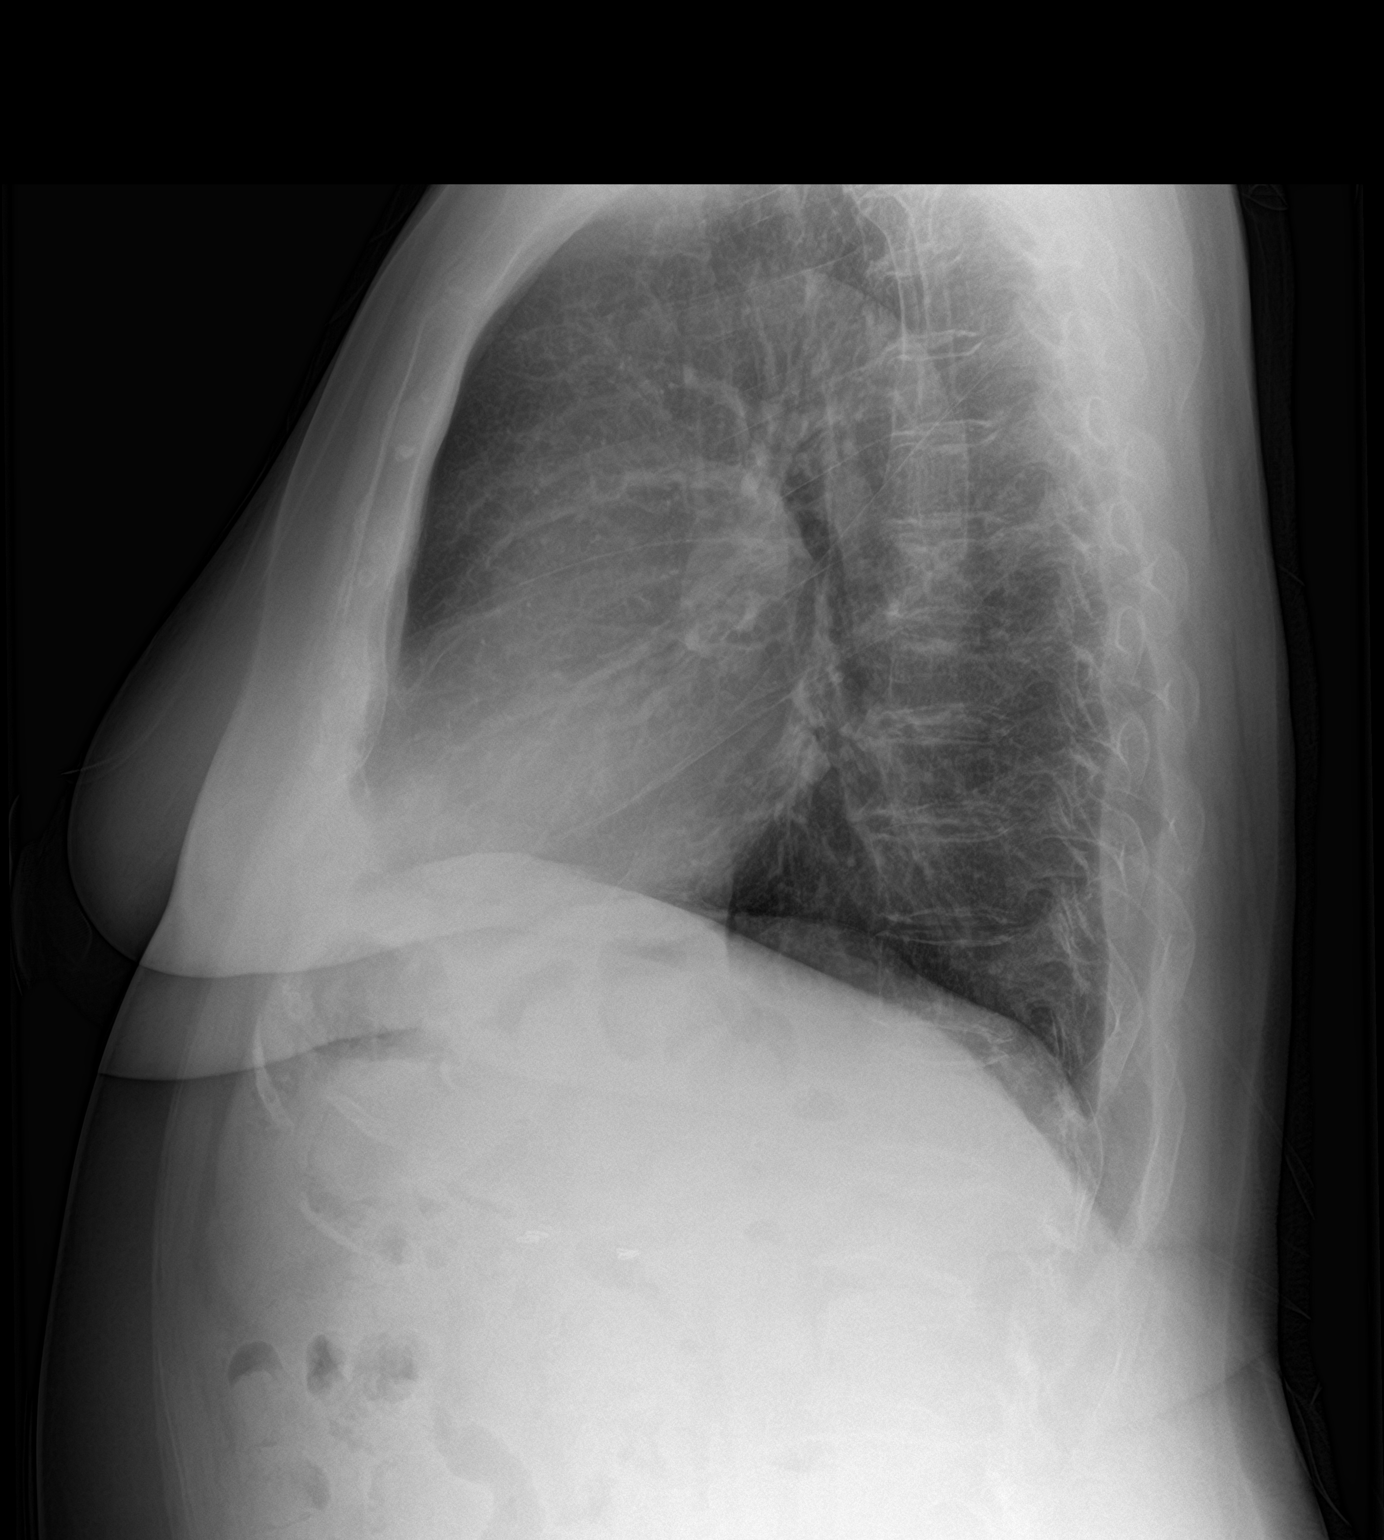

[2 of 2 positions shown; findings below may reference images not displayed]

FINDINGS: The heart size and mediastinal contours are within normal limits.
Both lungs are clear. The visualized skeletal structures are
unremarkable.
IMPRESSION: No active cardiopulmonary disease.

## 2018-04-15 ENCOUNTER — Encounter (HOSPITAL_COMMUNITY): Payer: Self-pay

## 2018-04-15 ENCOUNTER — Other Ambulatory Visit (HOSPITAL_COMMUNITY): Payer: Medicare HMO

## 2018-04-30 DIAGNOSIS — E7849 Other hyperlipidemia: Secondary | ICD-10-CM | POA: Diagnosis not present

## 2018-04-30 DIAGNOSIS — Z1389 Encounter for screening for other disorder: Secondary | ICD-10-CM | POA: Diagnosis not present

## 2018-04-30 DIAGNOSIS — E785 Hyperlipidemia, unspecified: Secondary | ICD-10-CM | POA: Diagnosis not present

## 2018-04-30 DIAGNOSIS — Z6830 Body mass index (BMI) 30.0-30.9, adult: Secondary | ICD-10-CM | POA: Diagnosis not present

## 2018-04-30 DIAGNOSIS — Z0001 Encounter for general adult medical examination with abnormal findings: Secondary | ICD-10-CM | POA: Diagnosis not present

## 2018-04-30 DIAGNOSIS — M159 Polyosteoarthritis, unspecified: Secondary | ICD-10-CM | POA: Diagnosis not present

## 2018-04-30 DIAGNOSIS — E6609 Other obesity due to excess calories: Secondary | ICD-10-CM | POA: Diagnosis not present

## 2018-04-30 DIAGNOSIS — K219 Gastro-esophageal reflux disease without esophagitis: Secondary | ICD-10-CM | POA: Diagnosis not present

## 2018-05-22 DIAGNOSIS — D229 Melanocytic nevi, unspecified: Secondary | ICD-10-CM | POA: Diagnosis not present

## 2018-05-22 DIAGNOSIS — L570 Actinic keratosis: Secondary | ICD-10-CM | POA: Diagnosis not present

## 2018-05-22 DIAGNOSIS — L820 Inflamed seborrheic keratosis: Secondary | ICD-10-CM | POA: Diagnosis not present

## 2018-05-22 DIAGNOSIS — L720 Epidermal cyst: Secondary | ICD-10-CM | POA: Diagnosis not present

## 2018-07-24 ENCOUNTER — Other Ambulatory Visit (HOSPITAL_COMMUNITY): Payer: Self-pay | Admitting: Family Medicine

## 2018-07-24 DIAGNOSIS — Z1231 Encounter for screening mammogram for malignant neoplasm of breast: Secondary | ICD-10-CM

## 2018-08-14 ENCOUNTER — Other Ambulatory Visit: Payer: Self-pay

## 2018-08-14 ENCOUNTER — Ambulatory Visit (HOSPITAL_COMMUNITY)
Admission: RE | Admit: 2018-08-14 | Discharge: 2018-08-14 | Disposition: A | Discharge status: Home / Self Care | Payer: Medicare HMO | Source: Ambulatory Visit | Attending: Family Medicine | Admitting: Family Medicine

## 2018-08-14 DIAGNOSIS — Z1231 Encounter for screening mammogram for malignant neoplasm of breast: Secondary | ICD-10-CM | POA: Diagnosis not present

## 2018-08-29 DIAGNOSIS — R69 Illness, unspecified: Secondary | ICD-10-CM | POA: Diagnosis not present

## 2018-09-25 ENCOUNTER — Telehealth: Payer: Self-pay | Admitting: Radiology

## 2018-09-25 ENCOUNTER — Ambulatory Visit (INDEPENDENT_AMBULATORY_CARE_PROVIDER_SITE_OTHER): Payer: Medicare HMO | Admitting: Orthopaedic Surgery

## 2018-09-25 ENCOUNTER — Encounter: Payer: Self-pay | Admitting: Orthopaedic Surgery

## 2018-09-25 ENCOUNTER — Ambulatory Visit (INDEPENDENT_AMBULATORY_CARE_PROVIDER_SITE_OTHER): Payer: Medicare HMO

## 2018-09-25 ENCOUNTER — Other Ambulatory Visit: Payer: Self-pay

## 2018-09-25 VITALS — BP 145/90 | HR 64 | Ht 64.0 in | Wt 180.0 lb

## 2018-09-25 DIAGNOSIS — M48061 Spinal stenosis, lumbar region without neurogenic claudication: Secondary | ICD-10-CM | POA: Diagnosis not present

## 2018-09-25 NOTE — Telephone Encounter (Signed)
sch MRI [facility] No metal except for ACDF

## 2018-09-25 NOTE — Progress Notes (Signed)
Office Visit Note   Patient: Patricia Hodges           Date of Birth: 1945-12-20           MRN: [id number] Visit Date: 09/25/2018              Requested by: Sharilyn Sites, [address] [place],  [place] [address] PCP: Sharilyn Sites, MD   Assessment & Plan: Visit Diagnoses:  1. Lumbar foraminal stenosis     Plan: Patient's had several years history of progressive symptoms she has been through therapy in the past had epidural steroid several times.  Her pain has progressed over the last 6 months and we will proceed with new lumbar MRI scan with  follow-up after scan for review.  Follow-Up Instructions: No follow-ups on file.   Orders:  Orders Placed This Encounter  Procedures  . XR Lumbar Spine 2-3 Views   No orders of the defined types were placed in this encounter.     Procedures: No procedures performed   Clinical Data: No additional findings.   Subjective: Chief Complaint  Patient presents with  . Lower Back - Pain    HPI day 73 year old female returns with ongoing problems with increased back pain.  She has had previous epidural injections and has had greater than 10 years history of progressive back symptoms.  She states the pain is progressed where she is now ready to consider proceeding with operative intervention.  Previously we discussed possible microdiscectomy at L4-5 only versus L4-5 and L5-S1 on office visit of 12/13/2016.  Patient states she has bilateral leg symptoms with numbness in her feet and weakness after walking.  Last MRI was 08/29/2016 which showed subarticular extrusion central and left at L4-5 and right foraminal protrusion causing severe right neural foraminal stenosis.  At L5-S1 she had grade 1 anterolisthesis with severe right and moderate left facet hypertrophy and severe right and mild left foraminal stenosis.  Patient notes the pain with standing she has difficulty walking more than 2 blocks.  She gets some relief with supine  position.  Some days she rates her pain in her back and legs are severe other days moderate to severe.  Review of Systems 14 point review of systems positive for foraminal stenosis, GERD, previous cholecystectomy.  Previous C5-6 cervical fusion 2017 doing well otherwise noncontributory to HPI.   Objective: Vital Signs: BP (!) 145/90   Pulse 64   Ht 5\' 4"  (1.626 m)   Wt 180 lb (81.6 kg)   BMI 30.90 kg/m   Physical Exam Constitutional:      Appearance: She is well-developed.  HENT:     Head: Normocephalic.     Right Ear: External ear normal.     Left Ear: External ear normal.  Eyes:     Pupils: Pupils are equal, round, and reactive to light.  Neck:     Thyroid: No thyromegaly.     Trachea: No tracheal deviation.  Cardiovascular:     Rate and Rhythm: Normal rate.  Pulmonary:     Effort: Pulmonary effort is normal.  Abdominal:     Palpations: Abdomen is soft.  Skin:    General: Skin is warm and dry.  Neurological:     Mental Status: She is alert and oriented to person, place, and time.  Psychiatric:        Behavior: Behavior normal.     Ortho Exam patient has bilateral sciatic notch tenderness worse on the left than right.  Positive straight leg raising on the left at 70 degrees negative on the right.  Knee and ankle jerk are 2+ and symmetrical she is able to heel and toe walk several steps.  Peroneals are strong.  Negative logroll to the hips knees reach full extension.  Specialty Comments:  No specialty comments available.  Imaging: No results found.   PMFS History: Patient Active Problem List   Diagnosis Date Noted  . Lumbar foraminal stenosis 09/06/2016  . Surgery, elective   . History of colonic polyps   . ABDOMINAL BLOATING 11/11/2009  . HELICOBACTER PYLORI GASTRITIS 11/17/2007  . SCHATZKI'S RING 11/17/2007  . GERD 11/17/2007  . HIATAL HERNIA 11/17/2007  . EOSINOPHILIC GASTROENTERITIS 21/19/4174  . Other spondylosis with radiculopathy, lumbar region  11/17/2007  . WEIGHT LOSS 11/17/2007  . NAUSEA 11/17/2007  . VOMITING 11/17/2007  . HEARTBURN 11/17/2007  . OTHER DYSPHAGIA 11/17/2007  . DIARRHEA 11/17/2007  . ABDOMINAL PAIN, HX OF 11/17/2007  . CHOLECYSTECTOMY, HX OF 11/17/2007   Past Medical History:  Diagnosis Date  . Diverticulosis   . Dysphagia   . Esophageal reflux   . Gastroenteritis, eosinophilic   . H pylori ulcer   . High cholesterol   . Osteoarthritis   . S/P colonoscopy 2006   diverticulosis, due for repeat in 2016  . S/P endoscopy 2007   Schatzki's Ring    Family History  Problem Relation Age of Onset  . Colon cancer Neg Hx     Past Surgical History:  Procedure Laterality Date  . ANTERIOR CERVICAL DECOMP/DISCECTOMY FUSION N/A 01/30/2016   Procedure: C5-6 Anterior Cervical Discectomy and Fusion, Allograft, Plate;  Surgeon: [name], MD;  Location: [facility];  Service: Orthopedics;  Laterality: N/A;  . bladder tack    . BUNIONECTOMY Right   . CARPAL TUNNEL RELEASE Left   . CATARACT EXTRACTION W/PHACO Right 11/28/2015   Procedure: CATARACT EXTRACTION PHACO AND INTRAOCULAR LENS PLACEMENT ([address]) RIGHT;  Surgeon: Tonny Branch, MD;  [facility];  Service: Ophthalmology;  Laterality: Right;  CDE: 6.96  . CATARACT EXTRACTION W/PHACO Left 12/15/2015   Procedure: CATARACT EXTRACTION PHACO AND INTRAOCULAR LENS PLACEMENT LEFT EYE CDE=9.04;  Surgeon: Tonny Branch, MD;  [facility];  Service: Ophthalmology;  Laterality: Left;  left  . CESAREAN SECTION    . CHOLECYSTECTOMY    . COLONOSCOPY  2006  . COLONOSCOPY N/A 09/08/2014   Procedure: COLONOSCOPY;  Surgeon: Daneil Dolin, MD;  [facility];  Service: Endoscopy;  Laterality: N/A;  10:30 AM  . COLONOSCOPY N/A 11/20/2017   Procedure: COLONOSCOPY;  Surgeon: Daneil Dolin, MD;  [facility];  Service: Endoscopy;  Laterality: N/A;  10:30  . feet surgery    . HAND SURGERY Right    on ring finger due to swollen knuckle  . Hx schatski's ring    .  POLYPECTOMY  11/20/2017   Procedure: POLYPECTOMY;  Surgeon: Daneil Dolin, MD;  [facility];  Service: Endoscopy;;  ascending colon (CSx1) descending colon(CSx1)  . S/P Hysterectomy     Social History   Occupational History  . Not on file  Tobacco Use  . Smoking status: Former Smoker    Packs/day: 1.00    Years: 20.00    Pack years: 20.00    Types: Cigarettes    Quit date: 11/23/1978    Years since quitting: 39.8  . Smokeless tobacco: Never Used  . Tobacco comment: quit about 30 yrs ago  Substance and Sexual  Activity  . Alcohol use: No  . Drug use: No  . Sexual activity: Not on file

## 2018-09-26 ENCOUNTER — Telehealth: Payer: Self-pay | Admitting: Orthopaedic Surgery

## 2018-09-26 NOTE — Telephone Encounter (Signed)
done

## 2018-09-26 NOTE — Telephone Encounter (Signed)
Received voicemail message from [place] with [facility] concerning the NPI is incorrect and need to be updated in evercore. Patient is having MRI 10/01/2018   The number to contact Theadora Rama is [phone]

## 2018-09-30 NOTE — Telephone Encounter (Signed)
sch for tomorrow. The approval has been done also

## 2018-10-01 ENCOUNTER — Other Ambulatory Visit: Payer: Self-pay

## 2018-10-01 ENCOUNTER — Ambulatory Visit (HOSPITAL_COMMUNITY)
Admission: RE | Admit: 2018-10-01 | Discharge: 2018-10-01 | Disposition: A | Discharge status: Home / Self Care | Payer: Medicare HMO | Source: Ambulatory Visit | Attending: Orthopaedic Surgery | Admitting: Orthopaedic Surgery

## 2018-10-01 DIAGNOSIS — M48061 Spinal stenosis, lumbar region without neurogenic claudication: Secondary | ICD-10-CM | POA: Diagnosis not present

## 2018-10-01 DIAGNOSIS — M545 Low back pain: Secondary | ICD-10-CM | POA: Diagnosis not present

## 2018-10-09 ENCOUNTER — Other Ambulatory Visit: Payer: Self-pay

## 2018-10-09 ENCOUNTER — Encounter: Payer: Self-pay | Admitting: Orthopaedic Surgery

## 2018-10-09 ENCOUNTER — Ambulatory Visit (INDEPENDENT_AMBULATORY_CARE_PROVIDER_SITE_OTHER): Payer: Medicare HMO | Admitting: Orthopaedic Surgery

## 2018-10-09 VITALS — BP 154/91 | HR 60 | Ht 64.0 in | Wt 180.0 lb

## 2018-10-09 DIAGNOSIS — M48061 Spinal stenosis, lumbar region without neurogenic claudication: Secondary | ICD-10-CM

## 2018-10-09 DIAGNOSIS — M4726 Other spondylosis with radiculopathy, lumbar region: Secondary | ICD-10-CM | POA: Diagnosis not present

## 2018-10-09 NOTE — Progress Notes (Signed)
Office Visit Note   Patient: Patricia Hodges           Date of Birth: [birth year]           MRN: [id number] Visit Date: 10/09/2018              Requested by: Sharilyn Sites, [address],  [place] [address] PCP: Sharilyn Sites, MD   Assessment & Plan: Visit Diagnoses:  1. Lumbar foraminal stenosis   2. Other spondylosis with radiculopathy, lumbar region     Plan: We reviewed the MRI scan with patient and her husband.  She states she is actually better than she was last few weeks.  She does have some disc protrusion has more pain on the right than the left at L4-5.  Comparison to previous MRI scan images and report with patient and husband.  Was set up for an epidural with Dr. Ernestina Patches and I plan to recheck her in 6 weeks.  Follow-Up Instructions: Return in about 6 weeks (around 11/20/2018).   Orders:  Orders Placed This Encounter  Procedures  . Ambulatory referral to Physical Medicine Rehab   No orders of the defined types were placed in this encounter.     Procedures: No procedures performed   Clinical Data: No additional findings.   Subjective: Chief Complaint  Patient presents with  . Lower Back - Follow-up    HPI 73 year old female returns with ongoing problems with back pain.  She works at [employer] serving 1 day a week and has increased symptoms afterwards.  Previous epidural with Dr. Ernestina Patches 2018 gave her some temporary relief.  New MRI scan is been obtained lumbar spine is available for review and comparison.  Patient has foraminal stenosis at L5-S1 with anterolisthesis with severe foraminal stenosis on the right and moderate on the left.  Review of Systems 14 point system update unchanged from 09/25/2018 office visit.   Objective: Vital Signs: BP (!) 154/91   Pulse 60   Ht 5\' 4"  (1.626 m)   Wt 180 lb (81.6 kg)   BMI 30.90 kg/m   Physical Exam Constitutional:      Appearance: She is well-developed.  HENT:     Head: Normocephalic.     Right Ear: External ear normal.     Left Ear: External ear normal.  Eyes:     Pupils: Pupils are equal, round, and reactive to light.  Neck:     Thyroid: No thyromegaly.     Trachea: No tracheal deviation.  Cardiovascular:     Rate and Rhythm: Normal rate.  Pulmonary:     Effort: Pulmonary effort is normal.  Abdominal:     Palpations: Abdomen is soft.  Skin:    General: Skin is warm and dry.  Neurological:     Mental Status: She is alert and oriented to person, place, and time.  Psychiatric:        Behavior: Behavior normal.     Ortho Exam patient has some pain with straight leg raising on the right at 80 degrees.  Slight EHL weakness.  Negative logroll to the hips.  Specialty Comments:  No specialty comments available.  Imaging: CLINICAL DATA:  Chronic low back pain.  EXAM: MRI LUMBAR SPINE WITHOUT CONTRAST  TECHNIQUE: Multiplanar, multisequence MR imaging of the lumbar spine was performed. No intravenous contrast was administered.  COMPARISON:  MRI 08/29/2016.  X-ray 12/06/2008  FINDINGS: Segmentation:  Standard.  Alignment:  Grade 1 anterolisthesis L5 on S1, unchanged.  Vertebrae:  Redemonstration of inferior endplate Schmorl's node at the L1 level. Focal areas of high STIR signal at the inferior endplates of the L2 and L4 vertebral bodies, which are new from prior, and are favored to represent developing Schmorl's nodes. No acute fracture.  Conus medullaris and cauda equina: Conus extends to the L2 level. Conus and cauda equina appear normal.  Paraspinal and other soft tissues: Small left renal sinus cysts, unchanged from prior. The remaining visualized soft tissues are within normal limits.  Disc levels:  T12-L1: No significant disc protrusion, foraminal stenosis, or canal stenosis.  L1-L2: No significant disc protrusion, foraminal stenosis, or canal stenosis.  L2-L3: No significant disc protrusion, foraminal stenosis, or canal  stenosis.  L3-L4: Mild broad-based disc bulge and bilateral facet arthrosis results in mild bilateral foraminal narrowing without canal stenosis. Findings similar to prior.  L4-L5: Diffuse disc bulge with left paracentral component narrowing the left subarticular recess. Prominent right foraminal component is also noted resulting in severe narrowing of the right neural foramen. There is mild to moderate narrowing of the left neural foramen. No canal stenosis. Findings are similar to prior.  L5-S1: Grade 1 anterolisthesis with disc uncovering. Right worse than left facet arthrosis. Severe right-sided and mild-to-moderate left-sided foraminal narrowing without canal stenosis. Findings similar to prior.  IMPRESSION: Multilevel lumbar spondylosis most severe at the L4-5 and L5-S1 levels where there is severe right-sided foraminal narrowing. Findings have not significantly progressed when compared to prior MRI 08/29/2016.  New areas of high STIR signal adjacent to the inferior endplate of the L2 and L4 vertebral bodies suggestive of developing Schmorl's nodes. Although felt to be less likely, a marrow replacing lesion such as metastatic disease could have a similar appearance. Correlate with patient's history.   Electronically Signed   By: Davina Poke M.D.   On: 10/01/2018 13:40    PMFS History: Patient Active Problem List   Diagnosis Date Noted  . Lumbar foraminal stenosis 09/06/2016  . Surgery, elective   . History of colonic polyps   . ABDOMINAL BLOATING 11/11/2009  . HELICOBACTER PYLORI GASTRITIS 11/17/2007  . SCHATZKI'S RING 11/17/2007  . GERD 11/17/2007  . HIATAL HERNIA 11/17/2007  . EOSINOPHILIC GASTROENTERITIS 92/01/9416  . Other spondylosis with radiculopathy, lumbar region 11/17/2007  . WEIGHT LOSS 11/17/2007  . NAUSEA 11/17/2007  . VOMITING 11/17/2007  . HEARTBURN 11/17/2007  . OTHER DYSPHAGIA 11/17/2007  . DIARRHEA 11/17/2007  . ABDOMINAL  PAIN, HX OF 11/17/2007  . CHOLECYSTECTOMY, HX OF 11/17/2007   Past Medical History:  Diagnosis Date  . Diverticulosis   . Dysphagia   . Esophageal reflux   . Gastroenteritis, eosinophilic   . H pylori ulcer   . High cholesterol   . Osteoarthritis   . S/P colonoscopy 2006   diverticulosis, due for repeat in 2016  . S/P endoscopy 2007   Schatzki's Ring    Family History  Problem Relation Age of Onset  . Colon cancer Neg Hx     Past Surgical History:  Procedure Laterality Date  . ANTERIOR CERVICAL DECOMP/DISCECTOMY FUSION N/A 01/30/2016   Procedure: C5-6 Anterior Cervical Discectomy and Fusion, Allograft, Plate;  Surgeon: [name], MD;  Location: [place];  Service: Orthopedics;  Laterality: N/A;  . bladder tack    . BUNIONECTOMY Right   . CARPAL TUNNEL RELEASE Left   . CATARACT EXTRACTION W/PHACO Right 11/28/2015   Procedure: CATARACT EXTRACTION PHACO AND INTRAOCULAR LENS PLACEMENT ([facility]) RIGHT;  Surgeon: Tonny Branch, MD;  [facility];  Service:  Ophthalmology;  Laterality: Right;  CDE: 6.96  . CATARACT EXTRACTION W/PHACO Left 12/15/2015   Procedure: CATARACT EXTRACTION PHACO AND INTRAOCULAR LENS PLACEMENT LEFT EYE CDE=9.04;  Surgeon: Tonny Branch, MD;  [facility];  Service: Ophthalmology;  Laterality: Left;  left  . CESAREAN SECTION    . CHOLECYSTECTOMY    . COLONOSCOPY  2006  . COLONOSCOPY N/A 09/08/2014   Procedure: COLONOSCOPY;  Surgeon: Daneil Dolin, MD;  [facility];  Service: Endoscopy;  Laterality: N/A;  10:30 AM  . COLONOSCOPY N/A 11/20/2017   Procedure: COLONOSCOPY;  Surgeon: Daneil Dolin, MD;  [facility];  Service: Endoscopy;  Laterality: N/A;  10:30  . feet surgery    . HAND SURGERY Right    on ring finger due to swollen knuckle  . Hx schatski's ring    . POLYPECTOMY  11/20/2017   Procedure: POLYPECTOMY;  Surgeon: Daneil Dolin, MD;  [facility];  Service: Endoscopy;;  ascending colon (CSx1) descending colon(CSx1)  .  S/P Hysterectomy     Social History   Occupational History  . Not on file  Tobacco Use  . Smoking status: Former Smoker    Packs/day: 1.00    Years: 20.00    Pack years: 20.00    Types: Cigarettes    Quit date: 11/23/1978    Years since quitting: 39.9  . Smokeless tobacco: Never Used  . Tobacco comment: quit about 30 yrs ago  Substance and Sexual Activity  . Alcohol use: No  . Drug use: No  . Sexual activity: Not on file

## 2018-10-20 DIAGNOSIS — N390 Urinary tract infection, site not specified: Secondary | ICD-10-CM | POA: Diagnosis not present

## 2018-10-20 DIAGNOSIS — N342 Other urethritis: Secondary | ICD-10-CM | POA: Diagnosis not present

## 2018-10-20 DIAGNOSIS — Z6830 Body mass index (BMI) 30.0-30.9, adult: Secondary | ICD-10-CM | POA: Diagnosis not present

## 2018-10-20 DIAGNOSIS — E6609 Other obesity due to excess calories: Secondary | ICD-10-CM | POA: Diagnosis not present

## 2018-10-30 DIAGNOSIS — R69 Illness, unspecified: Secondary | ICD-10-CM | POA: Diagnosis not present

## 2018-11-05 ENCOUNTER — Ambulatory Visit (INDEPENDENT_AMBULATORY_CARE_PROVIDER_SITE_OTHER): Payer: Medicare HMO | Admitting: Physical Medicine and Rehabilitation

## 2018-11-05 ENCOUNTER — Ambulatory Visit: Payer: Self-pay

## 2018-11-05 ENCOUNTER — Encounter: Payer: Self-pay | Admitting: Physical Medicine and Rehabilitation

## 2018-11-05 VITALS — BP 154/85 | HR 51

## 2018-11-05 DIAGNOSIS — M5416 Radiculopathy, lumbar region: Secondary | ICD-10-CM

## 2018-11-05 MED ORDER — BETAMETHASONE SOD PHOS & ACET 6 (3-3) MG/ML IJ SUSP
12.0000 mg | Freq: Once | INTRAMUSCULAR | Status: AC
  Administered 2018-11-05: 12 mg

## 2018-11-05 NOTE — Progress Notes (Signed)
 .  Numeric Pain Rating Scale and Functional Assessment Average Pain 8   In the last MONTH (on 0-10 scale) has pain interfered with the following?  1. General activity like being  able to carry out your everyday physical activities such as walking, climbing stairs, carrying groceries, or moving a chair?  Rating(5)   +Driver, -BT, -Dye Allergies.  

## 2018-12-16 NOTE — Progress Notes (Signed)
Patricia Hodges - 73 y.o. female MRN [id number]  Date of birth: [birth year]  Office Visit Note: Visit Date: 11/05/2018 PCP: Sharilyn Sites, MD Referred by: Sharilyn Sites, MD  Subjective: Chief Complaint  Patient presents with  . Lower Back - Pain   HPI:  Patricia CEPIN is a 73 y.o. female who comes in today At the request of Dr. Rodell Perna for bilateral L4 transforaminal epidural steroid injection.  Patient is having pain both sides right slightly more than left.  Somewhat anterior lower legs but down to the feet with numbness and tingling.  MRI shows foraminal narrowing some lateral recess narrowing also normal disc herniation at L4-5.  She is really failed all manner of conservative care at this point.  She will follow-up with Dr. Lorin Mercy.  ROS Otherwise per HPI.  Assessment & Plan: Visit Diagnoses:  1. Lumbar radiculopathy     Plan: No additional findings.   Meds & Orders:  Meds ordered this encounter  Medications  . betamethasone acetate-betamethasone sodium phosphate (CELESTONE) injection 12 mg    Orders Placed This Encounter  Procedures  . XR C-ARM NO REPORT  . Epidural Steroid injection    Follow-up: No follow-ups on file.   Procedures: No procedures performed  Lumbosacral Transforaminal Epidural Steroid Injection - Sub-Pedicular Approach with Fluoroscopic Guidance  Patient: Patricia Hodges      Date of Birth: 11-18-1945 MRN: [id number] PCP: Sharilyn Sites, MD      Visit Date: 11/05/2018   Universal Protocol:    Date/Time: 11/05/2018  Consent Given By: the patient  Position: PRONE  Additional Comments: Vital signs were monitored before and after the procedure. Patient was prepped and draped in the usual sterile fashion. The correct patient, procedure, and site was verified.   Injection Procedure Details:  Procedure Site One Meds Administered:  Meds ordered this encounter  Medications  . betamethasone acetate-betamethasone sodium  phosphate (CELESTONE) injection 12 mg    Laterality: Bilateral  Location/Site:  L4-L5  Needle size: 22 G  Needle type: Spinal  Needle Placement: Transforaminal  Findings:    -Comments: Excellent flow of contrast along the nerve and into the epidural space.  Procedure Details: After squaring off the end-plates to get a true AP view, the C-arm was positioned so that an oblique view of the foramen as noted above was visualized. The target area is just inferior to the "nose of the scotty dog" or sub pedicular. The soft tissues overlying this structure were infiltrated with 2-3 ml. of 1% Lidocaine without Epinephrine.  The spinal needle was inserted toward the target using a "trajectory" view along the fluoroscope beam.  Under AP and lateral visualization, the needle was advanced so it did not puncture dura and was located close the 6 O'Clock position of the pedical in AP tracterory. Biplanar projections were used to confirm position. Aspiration was confirmed to be negative for CSF and/or blood. A 1-2 ml. volume of Isovue-250 was injected and flow of contrast was noted at each level. Radiographs were obtained for documentation purposes.   After attaining the desired flow of contrast documented above, a 0.5 to 1.0 ml test dose of 0.25% Marcaine was injected into each respective transforaminal space.  The patient was observed for 90 seconds post injection.  After no sensory deficits were reported, and normal lower extremity motor function was noted,   the above injectate was administered so that equal amounts of the injectate were placed at each foramen (level) into the transforaminal epidural space.  Additional Comments:  The patient tolerated the procedure well Dressing: 2 x 2 sterile gauze and Band-Aid    Post-procedure details: Patient was observed during the procedure. Post-procedure instructions were reviewed.  Patient left the clinic in stable condition.     Clinical History:  MRI LUMBAR SPINE WITHOUT CONTRAST  TECHNIQUE: Multiplanar, multisequence MR imaging of the lumbar spine was performed. No intravenous contrast was administered.  COMPARISON:  None.  FINDINGS: Segmentation: Normal. The lowest disc space is considered to be L5-S1.  Alignment:  Grade 1 L5-S1 anterolisthesis.  Vertebrae:  Large inferior endplate Schmorl's node at L1.  Conus medullaris: Extends to the L2 level and appears normal.  Paraspinal and other soft tissues: The visualized aorta, IVC and iliac vessels are normal. The visualized retroperitoneal organs and paraspinal soft tissues are normal.  Disc levels:  T12-L1: Normal disc space and facets. No spinal canal or neuroforaminal stenosis.  L1-L2: Normal disc space and facets. No spinal canal or neuroforaminal stenosis.  L2-L3: Normal disc space and facets. No spinal canal or neuroforaminal stenosis.  L3-L4: Moderate bilateral facet hypertrophy. Minimal disc bulge. No spinal canal stenosis. Mild bilateral neural foraminal narrowing.  L4-L5: Diffuse disc bulge with superimposed central/ left subarticular extrusion with minimal inferior migration. This narrows the left lateral recess. No central spinal canal stenosis. There is an additional right foraminal protrusion superimposed on the disc bulge, which causes severe right neural foraminal stenosis.  L5-S1: Grade 1 anterolisthesis. Severe right and moderate left facet hypertrophy. Minimal pseudo disc bulge. Severe right and mild left neural foraminal stenosis.  IMPRESSION: 1. Severe right foraminal stenosis at L4-L5 and L5-S1. The findings at L4-L5 are predominantly due to a right eccentric disc bulge with superimposed right foraminal protrusion. At L5-S1, this is predominantly due to severe right facet arthrosis. 2. Left lateral recess stenosis at L4-L5 secondary to small left subarticular extrusion. Correlate for left L5 radiculopathy.    Electronically Signed   By: Ulyses Jarred M.D.   On: 08/29/2016 20:31     Objective:  VS:  HT:    WT:   BMI:     BP:(!) 154/85  HR:(!) 51bpm  TEMP: ( )  RESP:  Physical Exam  Ortho Exam Imaging: No results found.

## 2018-12-16 NOTE — Procedures (Signed)
Lumbosacral Transforaminal Epidural Steroid Injection - Sub-Pedicular Approach with Fluoroscopic Guidance  Patient: Patricia Hodges      Date of Birth: [birth year] MRN: [id number] PCP: Sharilyn Sites, MD      Visit Date: 11/05/2018   Universal Protocol:    Date/Time: 11/05/2018  Consent Given By: the patient  Position: PRONE  Additional Comments: Vital signs were monitored before and after the procedure. Patient was prepped and draped in the usual sterile fashion. The correct patient, procedure, and site was verified.   Injection Procedure Details:  Procedure Site One Meds Administered:  Meds ordered this encounter  Medications  . betamethasone acetate-betamethasone sodium phosphate (CELESTONE) injection 12 mg    Laterality: Bilateral  Location/Site:  L4-L5  Needle size: 22 G  Needle type: Spinal  Needle Placement: Transforaminal  Findings:    -Comments: Excellent flow of contrast along the nerve and into the epidural space.  Procedure Details: After squaring off the end-plates to get a true AP view, the C-arm was positioned so that an oblique view of the foramen as noted above was visualized. The target area is just inferior to the "nose of the scotty dog" or sub pedicular. The soft tissues overlying this structure were infiltrated with 2-3 ml. of 1% Lidocaine without Epinephrine.  The spinal needle was inserted toward the target using a "trajectory" view along the fluoroscope beam.  Under AP and lateral visualization, the needle was advanced so it did not puncture dura and was located close the 6 O'Clock position of the pedical in AP tracterory. Biplanar projections were used to confirm position. Aspiration was confirmed to be negative for CSF and/or blood. A 1-2 ml. volume of Isovue-250 was injected and flow of contrast was noted at each level. Radiographs were obtained for documentation purposes.   After attaining the desired flow of contrast documented above, a  0.5 to 1.0 ml test dose of 0.25% Marcaine was injected into each respective transforaminal space.  The patient was observed for 90 seconds post injection.  After no sensory deficits were reported, and normal lower extremity motor function was noted,   the above injectate was administered so that equal amounts of the injectate were placed at each foramen (level) into the transforaminal epidural space.   Additional Comments:  The patient tolerated the procedure well Dressing: 2 x 2 sterile gauze and Band-Aid    Post-procedure details: Patient was observed during the procedure. Post-procedure instructions were reviewed.  Patient left the clinic in stable condition.

## 2018-12-18 DIAGNOSIS — M25579 Pain in unspecified ankle and joints of unspecified foot: Secondary | ICD-10-CM | POA: Diagnosis not present

## 2018-12-18 DIAGNOSIS — M79672 Pain in left foot: Secondary | ICD-10-CM | POA: Diagnosis not present

## 2018-12-18 DIAGNOSIS — M7662 Achilles tendinitis, left leg: Secondary | ICD-10-CM | POA: Diagnosis not present

## 2018-12-19 DIAGNOSIS — R69 Illness, unspecified: Secondary | ICD-10-CM | POA: Diagnosis not present

## 2018-12-31 DIAGNOSIS — Z6830 Body mass index (BMI) 30.0-30.9, adult: Secondary | ICD-10-CM | POA: Diagnosis not present

## 2018-12-31 DIAGNOSIS — J329 Chronic sinusitis, unspecified: Secondary | ICD-10-CM | POA: Diagnosis not present

## 2018-12-31 DIAGNOSIS — E6609 Other obesity due to excess calories: Secondary | ICD-10-CM | POA: Diagnosis not present

## 2018-12-31 DIAGNOSIS — K219 Gastro-esophageal reflux disease without esophagitis: Secondary | ICD-10-CM | POA: Diagnosis not present

## 2019-01-06 ENCOUNTER — Encounter (HOSPITAL_COMMUNITY): Payer: Self-pay | Admitting: *Deleted

## 2019-01-06 ENCOUNTER — Telehealth: Payer: Self-pay | Admitting: Internal Medicine

## 2019-01-06 ENCOUNTER — Emergency Department (HOSPITAL_COMMUNITY)
Admission: EM | Admit: 2019-01-06 | Discharge: 2019-01-06 | Disposition: A | Discharge status: Home / Self Care | Payer: Medicare HMO | Attending: Emergency Medicine | Admitting: Emergency Medicine

## 2019-01-06 ENCOUNTER — Encounter: Payer: Self-pay | Admitting: Internal Medicine

## 2019-01-06 ENCOUNTER — Ambulatory Visit (INDEPENDENT_AMBULATORY_CARE_PROVIDER_SITE_OTHER): Payer: Medicare HMO | Admitting: Internal Medicine

## 2019-01-06 ENCOUNTER — Other Ambulatory Visit: Payer: Self-pay | Admitting: *Deleted

## 2019-01-06 ENCOUNTER — Other Ambulatory Visit: Payer: Self-pay

## 2019-01-06 ENCOUNTER — Emergency Department (HOSPITAL_COMMUNITY): Payer: Medicare HMO

## 2019-01-06 VITALS — BP 124/74 | HR 88 | Temp 101.3°F | Ht 64.0 in

## 2019-01-06 DIAGNOSIS — Z87891 Personal history of nicotine dependence: Secondary | ICD-10-CM | POA: Insufficient documentation

## 2019-01-06 DIAGNOSIS — R0789 Other chest pain: Secondary | ICD-10-CM | POA: Insufficient documentation

## 2019-01-06 DIAGNOSIS — R079 Chest pain, unspecified: Secondary | ICD-10-CM | POA: Diagnosis not present

## 2019-01-06 DIAGNOSIS — Z20822 Contact with and (suspected) exposure to covid-19: Secondary | ICD-10-CM

## 2019-01-06 DIAGNOSIS — R509 Fever, unspecified: Secondary | ICD-10-CM | POA: Diagnosis not present

## 2019-01-06 DIAGNOSIS — Z79899 Other long term (current) drug therapy: Secondary | ICD-10-CM | POA: Diagnosis not present

## 2019-01-06 DIAGNOSIS — K219 Gastro-esophageal reflux disease without esophagitis: Secondary | ICD-10-CM

## 2019-01-06 DIAGNOSIS — R1084 Generalized abdominal pain: Secondary | ICD-10-CM | POA: Insufficient documentation

## 2019-01-06 DIAGNOSIS — R945 Abnormal results of liver function studies: Secondary | ICD-10-CM | POA: Insufficient documentation

## 2019-01-06 DIAGNOSIS — Z20828 Contact with and (suspected) exposure to other viral communicable diseases: Secondary | ICD-10-CM | POA: Diagnosis not present

## 2019-01-06 DIAGNOSIS — R7989 Other specified abnormal findings of blood chemistry: Secondary | ICD-10-CM

## 2019-01-06 LAB — COMPREHENSIVE METABOLIC PANEL
ALT: 57 U/L — ABNORMAL HIGH (ref 0–44)
AST: 56 U/L — ABNORMAL HIGH (ref 15–41)
Albumin: 4 g/dL (ref 3.5–5.0)
Alkaline Phosphatase: 97 U/L (ref 38–126)
Anion gap: 7 (ref 5–15)
BUN: 13 mg/dL (ref 8–23)
CO2: 24 mmol/L (ref 22–32)
Calcium: 8.4 mg/dL — ABNORMAL LOW (ref 8.9–10.3)
Chloride: 102 mmol/L (ref 98–111)
Creatinine, Ser: 0.81 mg/dL (ref 0.44–1.00)
GFR calc Af Amer: >60 mL/min (ref >60)
GFR calc non Af Amer: >60 mL/min (ref >60)
Glucose, Bld: 123 mg/dL — ABNORMAL HIGH (ref 70–99)
Potassium: 3.9 mmol/L (ref 3.5–5.1)
Sodium: 133 mmol/L — ABNORMAL LOW (ref 135–145)
Total Bilirubin: 0.3 mg/dL (ref 0.3–1.2)
Total Protein: 7.5 g/dL (ref 6.5–8.1)

## 2019-01-06 LAB — CBC WITH DIFFERENTIAL/PLATELET
Abs Immature Granulocytes: 0.02 10*3/uL (ref 0.00–0.07)
Basophils Absolute: 0 10*3/uL (ref 0.0–0.1)
Basophils Relative: 0 %
Eosinophils Absolute: 0.1 10*3/uL (ref 0.0–0.5)
Eosinophils Relative: 2 %
HCT: 48 % — ABNORMAL HIGH (ref 36.0–46.0)
Hemoglobin: 15 g/dL (ref 12.0–15.0)
Immature Granulocytes: 0 %
Lymphocytes Relative: 25 %
Lymphs Abs: 1.2 10*3/uL (ref 0.7–4.0)
MCH: 29.1 pg (ref 26.0–34.0)
MCHC: 31.3 g/dL (ref 30.0–36.0)
MCV: 93.2 fL (ref 80.0–100.0)
Monocytes Absolute: 0.4 10*3/uL (ref 0.1–1.0)
Monocytes Relative: 9 %
Neutro Abs: 2.9 10*3/uL (ref 1.7–7.7)
Neutrophils Relative %: 64 %
Platelets: 253 10*3/uL (ref 150–400)
RBC: 5.15 MIL/uL — ABNORMAL HIGH (ref 3.87–5.11)
RDW: 12.9 % (ref 11.5–15.5)
WBC: 4.7 10*3/uL (ref 4.0–10.5)
nRBC: 0 % (ref 0.0–0.2)

## 2019-01-06 LAB — URINALYSIS, ROUTINE W REFLEX MICROSCOPIC
Bacteria, UA: NONE SEEN
Bilirubin Urine: NEGATIVE
Glucose, UA: NEGATIVE mg/dL
Ketones, ur: NEGATIVE mg/dL
Leukocytes,Ua: NEGATIVE
Nitrite: NEGATIVE
Protein, ur: NEGATIVE mg/dL
Specific Gravity, Urine: 1.024 (ref 1.005–1.030)
pH: 5 (ref 5.0–8.0)

## 2019-01-06 LAB — TROPONIN I (HIGH SENSITIVITY): Troponin I (High Sensitivity): 3 ng/L (ref <18)

## 2019-01-06 LAB — LIPASE, BLOOD: Lipase: 47 U/L (ref 11–51)

## 2019-01-06 MED ORDER — LIDOCAINE VISCOUS HCL 2 % MT SOLN
15.0000 mL | Freq: Once | OROMUCOSAL | Status: AC
  Administered 2019-01-06: 15 mL via ORAL
  Filled 2019-01-06: qty 15

## 2019-01-06 MED ORDER — ALUM & MAG HYDROXIDE-SIMETH 200-200-20 MG/5ML PO SUSP
30.0000 mL | Freq: Once | ORAL | Status: AC
  Administered 2019-01-06: 30 mL via ORAL
  Filled 2019-01-06: qty 30

## 2019-01-06 MED ORDER — ONDANSETRON HCL 4 MG/2ML IJ SOLN
4.0000 mg | Freq: Once | INTRAMUSCULAR | Status: AC
  Administered 2019-01-06: 4 mg via INTRAVENOUS
  Filled 2019-01-06: qty 2

## 2019-01-06 NOTE — ED Triage Notes (Signed)
Patient was seen by PCP Dr. Gala Romney today to be evaluated for the antibiotic she was prescribed at [facility] for abdominal side effects.  She was advised to be seen in the ED after complaining of chest pain.

## 2019-01-06 NOTE — Discharge Instructions (Signed)
Your work-up today was reassuring.  Drink plenty of fluids and get plenty of rest.  Take ibuprofen or Tylenol as recommended for fever pain.  Continue to take your home medications as prescribed.  Your liver tests were very mildly elevated.  You can follow-up with your gastroenterologist for this.  He may want to get repeat labs to trend this and make sure they go back down.  Your Covid test will result within 48 hours.  You will get a phone call if your test is positive, no phone call if your test is negative.  You can also see your results on MyChart.  Instructions for this will be on your paperwork.  Continue to quarantine at home.  Return to the emergency department if any concerning signs or symptoms develop such as persistent vomiting, high fevers, worsening pains.

## 2019-01-06 NOTE — Telephone Encounter (Signed)
See telephone note.

## 2019-01-06 NOTE — ED Notes (Signed)
Per Dr. Gala Romney.  He spoke with pt over the phone in the parking lot of his office.  Reports pt told his staff she has been having some chest pain under left breast.  Reports recently put on augmentin for sinusitis and had stopped it because she was feeling worse.  Had a covid test earlier today but doesn't have results yet.  Also reports fever 101 when checked by his office staff.

## 2019-01-06 NOTE — Telephone Encounter (Signed)
Patient was scheduled for a face-to-face visit this afternoon.  She was being checked in.  Found to have an oral temperature of 101.3 treated for "sinusitis" last week.  Augmentin.   I spoke to her via telephone ; waiting just outside the office.   Felt the medicine was too strong.  Stopped 2 days ago.  Just came from the hospital where she had a swab for Covid testing.  Feels like her insides are twisting complaining of pain behind her breastbone x2 days. I told her she needed urgent evaluation and needed to go to the emergency department.  Further evaluation warranted the cannot be accomplished here in the office today.  Spoke to [employer], Therapist, sports, Copy at [employer].  They should anticipate patient arrival soon.

## 2019-01-06 NOTE — ED Provider Notes (Signed)
[facility] EMERGENCY DEPARTMENT Provider Note   CSN: [id number] Arrival date & time: 01/06/19  1410     History   Chief Complaint Chief Complaint  Patient presents with  . Chest Pain    HPI Patricia Hodges is a 73 y.o. female with history of diverticulosis, GERD, H. pylori gastritis presents for evaluation of acute onset, progressively worsening chest pains and abdominal pains for 1 week.  She reports that 9 days ago she developed symptoms consistent with sinusitis with sinus pressure and congestion.  Last Tuesday 1 week ago she went to see her PCP who started her on Augmentin for which she took 3 tablets but discontinued due to intolerance.  She states that after taking it she began to develop discomfort in her abdomen which has persisted since discontinuing the antibiotic.  She states "it feels like there is something just tearing me up ".  Also notes constant aching substernal chest pain which will radiate to the left or right at times.  She denies nausea, vomiting, melena, hematochezia, or urinary symptoms.  Has had a few episodes of watery nonbloody diarrhea.  She does report decreased appetite and decreased oral intake as well as generalized fatigue.  Has had a mild nonproductive cough, denies shortness of breath.  She has been taking her home medications without relief.  Her husband had similar symptoms but reports that he has significantly improved.  She went to see her gastroenterologist today for her abdominal discomfort but was noted to be febrile with a temp of 101.3 F and so she was sent to the ED for further evaluation.  She also had an outpatient Covid test obtained today.     The history is provided by the patient.    Past Medical History:  Diagnosis Date  . Diverticulosis   . Dysphagia   . Esophageal reflux   . Gastroenteritis, eosinophilic   . H pylori ulcer   . High cholesterol   . Osteoarthritis   . S/P colonoscopy 2006   diverticulosis, due for repeat in  2016  . S/P endoscopy 2007   Schatzki's Ring    Patient Active Problem List   Diagnosis Date Noted  . Lumbar foraminal stenosis 09/06/2016  . Surgery, elective   . History of colonic polyps   . ABDOMINAL BLOATING 11/11/2009  . HELICOBACTER PYLORI GASTRITIS 11/17/2007  . SCHATZKI'S RING 11/17/2007  . GERD 11/17/2007  . HIATAL HERNIA 11/17/2007  . EOSINOPHILIC GASTROENTERITIS 0000000  . Other spondylosis with radiculopathy, lumbar region 11/17/2007  . WEIGHT LOSS 11/17/2007  . NAUSEA 11/17/2007  . VOMITING 11/17/2007  . HEARTBURN 11/17/2007  . OTHER DYSPHAGIA 11/17/2007  . DIARRHEA 11/17/2007  . ABDOMINAL PAIN, HX OF 11/17/2007  . CHOLECYSTECTOMY, HX OF 11/17/2007    Past Surgical History:  Procedure Laterality Date  . ANTERIOR CERVICAL DECOMP/DISCECTOMY FUSION N/A 01/30/2016   Procedure: C5-6 Anterior Cervical Discectomy and Fusion, Allograft, Plate;  Surgeon: [name], MD;  Location: [facility];  Service: Orthopedics;  Laterality: N/A;  . bladder tack    . BUNIONECTOMY Right   . CARPAL TUNNEL RELEASE Left   . CATARACT EXTRACTION W/PHACO Right 11/28/2015   Procedure: CATARACT EXTRACTION PHACO AND INTRAOCULAR LENS PLACEMENT ([place]) RIGHT;  Surgeon: Tonny Branch, MD;  Location: [facility];  Service: Ophthalmology;  Laterality: Right;  CDE: 6.96  . CATARACT EXTRACTION W/PHACO Left 12/15/2015   Procedure: CATARACT EXTRACTION PHACO AND INTRAOCULAR LENS PLACEMENT LEFT EYE CDE=9.04;  Surgeon: Tonny Branch, MD;  Location: [facility];  Service: Ophthalmology;  Laterality: Left;  left  . CESAREAN SECTION    . CHOLECYSTECTOMY    . COLONOSCOPY  2006  . COLONOSCOPY N/A 09/08/2014   Procedure: COLONOSCOPY;  Surgeon: Daneil Dolin, MD;  Location: [facility];  Service: Endoscopy;  Laterality: N/A;  10:30 AM  . COLONOSCOPY N/A 11/20/2017   Procedure: COLONOSCOPY;  Surgeon: Daneil Dolin, MD;  Location: [facility];  Service: Endoscopy;  Laterality: N/A;  10:30  . feet surgery    . HAND SURGERY  Right    on ring finger due to swollen knuckle  . Hx schatski's ring    . POLYPECTOMY  11/20/2017   Procedure: POLYPECTOMY;  Surgeon: Daneil Dolin, MD;  Location: [facility];  Service: Endoscopy;;  ascending colon (CSx1) descending colon(CSx1)  . S/P Hysterectomy       OB History   No obstetric history on file.      Home Medications    Prior to Admission medications   Medication Sig Start Date End Date Taking? Authorizing Provider  CALCIUM-VITAMIN D PO Take 1 tablet by mouth daily.   Yes [provider]  cephALEXin (KEFLEX) 500 MG capsule Take 500 mg by mouth 4 (four) times daily. 10 day course starting on 12/29/2018 01/01/19  Yes [provider]  FLUoxetine (PROZAC) 10 MG tablet Take 10 mg by mouth every other day.    Yes [provider]  HYDROcodone-acetaminophen (NORCO/VICODIN) 5-325 MG tablet Take 1 tablet by mouth daily as needed for moderate pain.    Yes [provider]  omeprazole (PRILOSEC OTC) 20 MG tablet Take 20 mg by mouth daily.   Yes [provider]  amoxicillin-clavulanate (AUGMENTIN) 875-125 MG tablet Take 1 tablet by mouth 2 (two) times daily.    [provider]    Family History Family History  Problem Relation Age of Onset  . Colon cancer Neg Hx     Social History Social History   Tobacco Use  . Smoking status: Former Smoker    Packs/day: 1.00    Years: 20.00    Pack years: 20.00    Types: Cigarettes    Quit date: 11/23/1978    Years since quitting: 40.1  . Smokeless tobacco: Never Used  . Tobacco comment: quit about 30 yrs ago  Substance Use Topics  . Alcohol use: No  . Drug use: No     Allergies   Alendronate sodium, Aspirin, Augmentin [amoxicillin-pot clavulanate], Celecoxib, Ezetimibe-simvastatin, Rofecoxib, and Statins   Review of Systems Review of Systems  Constitutional: Positive for chills and fever.  Respiratory: Positive for cough. Negative for shortness of breath.    Cardiovascular: Positive for chest pain.  Gastrointestinal: Positive for abdominal pain and diarrhea. Negative for blood in stool, constipation, nausea and vomiting.  Genitourinary: Negative for dysuria, frequency, hematuria and urgency.  All other systems reviewed and are negative.    Physical Exam Updated Vital Signs BP 130/84   Pulse 76   Temp 98.9 F (37.2 C) (Oral)   Resp 20   Ht 5\' 4"  (1.626 m)   Wt 81.6 kg   SpO2 94%   BMI 30.90 kg/m   Physical Exam Vitals signs and nursing note reviewed.  Constitutional:      General: She is not in acute distress.    Appearance: She is well-developed.  HENT:     Head: Normocephalic and atraumatic.  Eyes:     General:        Right eye: No  discharge.        Left eye: No discharge.     Conjunctiva/sclera: Conjunctivae normal.  Neck:     Vascular: No JVD.     Trachea: No tracheal deviation.  Cardiovascular:     Rate and Rhythm: Normal rate.  Pulmonary:     Effort: Pulmonary effort is normal.  Abdominal:     General: There is no distension.     Palpations: Abdomen is soft.     Tenderness: There is generalized abdominal tenderness. There is no right CVA tenderness, left CVA tenderness, guarding or rebound.  Musculoskeletal:     Right lower leg: She exhibits no tenderness. No edema.     Left lower leg: She exhibits no tenderness. No edema.  Skin:    General: Skin is warm and dry.     Findings: No erythema.  Neurological:     Mental Status: She is alert.  Psychiatric:        Behavior: Behavior normal.      ED Treatments / Results  Labs (all labs ordered are listed, but only abnormal results are displayed) Labs Reviewed  CBC WITH DIFFERENTIAL/PLATELET - Abnormal; Notable for the following components:      Result Value   RBC 5.15 (*)    HCT 48.0 (*)    All other components within normal limits  COMPREHENSIVE METABOLIC PANEL - Abnormal; Notable for the following components:   Sodium 133 (*)    Glucose, Bld 123 (*)     Calcium 8.4 (*)    AST 56 (*)    ALT 57 (*)    All other components within normal limits  URINALYSIS, ROUTINE W REFLEX MICROSCOPIC - Abnormal; Notable for the following components:   APPearance HAZY (*)    Hgb urine dipstick MODERATE (*)    All other components within normal limits  LIPASE, BLOOD  TROPONIN I (HIGH SENSITIVITY)    EKG EKG Interpretation  Date/Time:  Tuesday January 06 2019 14:30:28 EDT Ventricular Rate:  85 PR Interval:  148 QRS Duration: 76 QT Interval:  340 QTC Calculation: 404 R Axis:   32 Text Interpretation:  Normal sinus rhythm Nonspecific ST abnormality Abnormal ECG since last tracing no significant change Confirmed by Noemi Chapel [phone]) on 01/06/2019 3:19:49 PM   Radiology Dg Chest Portable 1 View  Result Date: 01/06/2019 CLINICAL DATA:  Fever and chest pain EXAM: PORTABLE CHEST 1 VIEW COMPARISON:  06/03/2017 FINDINGS: Surgical hardware in the cervical spine. No focal airspace disease or effusion. Cardiomediastinal silhouette within normal limits. Aortic atherosclerosis. No pneumothorax. IMPRESSION: No active disease. Electronically Signed   By: Donavan Foil M.D.   On: 01/06/2019 15:57    Procedures Procedures (including critical care time)  Medications Ordered in ED Medications  ondansetron (ZOFRAN) injection 4 mg (4 mg Intravenous Given 01/06/19 1715)  alum & mag hydroxide-simeth (MAALOX/MYLANTA) 200-200-20 MG/5ML suspension 30 mL (30 mLs Oral Given 01/06/19 1715)    And  lidocaine (XYLOCAINE) 2 % viscous mouth solution 15 mL (15 mLs Oral Given 01/06/19 1715)     Initial Impression / Assessment and Plan / ED Course  I have reviewed the triage vital signs and the nursing notes.  Pertinent labs & imaging results that were available during my care of the patient were reviewed by me and considered in my medical decision making (see chart for details).         Patricia Hodges was evaluated in Emergency Department on 01/06/2019 for the  symptoms described in the history  of present illness. She was evaluated in the context of the global COVID-19 pandemic, which necessitated consideration that the patient might be at risk for infection with the SARS-CoV-2 virus that causes COVID-19. Institutional protocols and algorithms that pertain to the evaluation of patients at risk for COVID-19 are in a state of rapid change based on information released by regulatory bodies including the CDC and federal and state organizations. These policies and algorithms were followed during the patient's care in the ED.  Patient presenting for evaluation of chest pain and abdominal pain for 1 week.  Noted to be febrile by her gastroenterologist today and sent to the ED for further evaluation.  Also obtain outpatient Covid swab earlier today.  On my assessment she is afebrile, vital signs are stable.  She is nontoxic in appearance.  EKG shows normal sinus rhythm, no significant changes from last tracing.  Her troponin is negative and since her symptoms have been constant per the patient for the last 7 days I do not feel that serial troponins are necessary.  Of low suspicion of ACS/MI.  Chest x-ray shows no acute cardiopulmonary abnormalities with no evidence of pneumothorax, pneumonia, pleural effusion, or cardiomegaly.  Doubt PE.  Abdomen is soft with generalized soreness but no focal tenderness.  No peritoneal signs.  Lab work reviewed by me shows no leukocytosis, no anemia, no metabolic derangements, no renal insufficiency.  Her AST and ALT are very mildly elevated but she has no focal right upper quadrant tenderness, Murphy sign absent, and she is status post cholecystectomy several years ago.  Unsure of clinical significance but doubt acute hepatobiliary dysfunction.  Doubt acute surgical abdominal pathology given reassuring physical examination and blood work.  On reevaluation patient resting comfortably in no apparent distress, reports she is feeling better.  She  would like to go home which I think is reasonable.  We discussed symptomatic management, quarantining at home per current CDC guidelines.  Recommend follow with PCP for reevaluation of symptoms and her gastroenterologist for reevaluation of abnormal LFTs.  Discussed strict ED return precautions.  Patient and daughter-in-law who was available on the phone verbalized understanding of and agreement with plan and patient stable for discharge home at this time.  Final Clinical Impressions(s) / ED Diagnoses   Final diagnoses:  Fever in adult  Atypical chest pain  Generalized abdominal pain  Elevated LFTs    ED Discharge Orders    None       Debroah Baller 01/06/19 Loman Chroman, MD 01/07/19 [phone]

## 2019-01-07 LAB — NOVEL CORONAVIRUS, NAA: SARS-CoV-2, NAA: DETECTED — AB

## 2019-01-07 NOTE — Progress Notes (Signed)
Not seen in the office.  See telephone note.

## 2019-01-12 ENCOUNTER — Other Ambulatory Visit: Payer: Self-pay

## 2019-01-12 ENCOUNTER — Emergency Department (HOSPITAL_COMMUNITY): Payer: Medicare HMO

## 2019-01-12 ENCOUNTER — Encounter (HOSPITAL_COMMUNITY): Payer: Self-pay

## 2019-01-12 ENCOUNTER — Emergency Department (HOSPITAL_COMMUNITY)
Admission: EM | Admit: 2019-01-12 | Discharge: 2019-01-13 | Disposition: A | Discharge status: Home / Self Care | Payer: Medicare HMO | Attending: Emergency Medicine | Admitting: Emergency Medicine

## 2019-01-12 ENCOUNTER — Telehealth: Payer: Self-pay

## 2019-01-12 DIAGNOSIS — U071 COVID-19: Secondary | ICD-10-CM | POA: Diagnosis not present

## 2019-01-12 DIAGNOSIS — R9431 Abnormal electrocardiogram [ECG] [EKG]: Secondary | ICD-10-CM | POA: Diagnosis not present

## 2019-01-12 DIAGNOSIS — R110 Nausea: Secondary | ICD-10-CM | POA: Insufficient documentation

## 2019-01-12 DIAGNOSIS — R1013 Epigastric pain: Secondary | ICD-10-CM

## 2019-01-12 DIAGNOSIS — Z87891 Personal history of nicotine dependence: Secondary | ICD-10-CM | POA: Diagnosis not present

## 2019-01-12 DIAGNOSIS — K760 Fatty (change of) liver, not elsewhere classified: Secondary | ICD-10-CM | POA: Diagnosis not present

## 2019-01-12 DIAGNOSIS — R109 Unspecified abdominal pain: Secondary | ICD-10-CM | POA: Diagnosis present

## 2019-01-12 DIAGNOSIS — R0602 Shortness of breath: Secondary | ICD-10-CM | POA: Diagnosis not present

## 2019-01-12 DIAGNOSIS — Z20828 Contact with and (suspected) exposure to other viral communicable diseases: Secondary | ICD-10-CM | POA: Insufficient documentation

## 2019-01-12 DIAGNOSIS — Z79899 Other long term (current) drug therapy: Secondary | ICD-10-CM | POA: Insufficient documentation

## 2019-01-12 LAB — COMPREHENSIVE METABOLIC PANEL
ALT: 78 U/L — ABNORMAL HIGH (ref 0–44)
AST: 62 U/L — ABNORMAL HIGH (ref 15–41)
Albumin: 3.8 g/dL (ref 3.5–5.0)
Alkaline Phosphatase: 130 U/L — ABNORMAL HIGH (ref 38–126)
Anion gap: 8 (ref 5–15)
BUN: 17 mg/dL (ref 8–23)
CO2: 25 mmol/L (ref 22–32)
Calcium: 8.6 mg/dL — ABNORMAL LOW (ref 8.9–10.3)
Chloride: 104 mmol/L (ref 98–111)
Creatinine, Ser: 0.75 mg/dL (ref 0.44–1.00)
GFR calc Af Amer: >60 mL/min (ref >60)
GFR calc non Af Amer: >60 mL/min (ref >60)
Glucose, Bld: 110 mg/dL — ABNORMAL HIGH (ref 70–99)
Potassium: 4 mmol/L (ref 3.5–5.1)
Sodium: 137 mmol/L (ref 135–145)
Total Bilirubin: 0.5 mg/dL (ref 0.3–1.2)
Total Protein: 7.4 g/dL (ref 6.5–8.1)

## 2019-01-12 LAB — CBC
HCT: 47.2 % — ABNORMAL HIGH (ref 36.0–46.0)
Hemoglobin: 14.6 g/dL (ref 12.0–15.0)
MCH: 28.6 pg (ref 26.0–34.0)
MCHC: 30.9 g/dL (ref 30.0–36.0)
MCV: 92.4 fL (ref 80.0–100.0)
Platelets: 372 10*3/uL (ref 150–400)
RBC: 5.11 MIL/uL (ref 3.87–5.11)
RDW: 12.3 % (ref 11.5–15.5)
WBC: 7.3 10*3/uL (ref 4.0–10.5)
nRBC: 0 % (ref 0.0–0.2)

## 2019-01-12 LAB — URINALYSIS, ROUTINE W REFLEX MICROSCOPIC
Bilirubin Urine: NEGATIVE
Glucose, UA: NEGATIVE mg/dL
Hgb urine dipstick: NEGATIVE
Ketones, ur: NEGATIVE mg/dL
Leukocytes,Ua: NEGATIVE
Nitrite: NEGATIVE
Protein, ur: NEGATIVE mg/dL
Specific Gravity, Urine: 1.013 (ref 1.005–1.030)
pH: 7 (ref 5.0–8.0)

## 2019-01-12 LAB — LIPASE, BLOOD: Lipase: 44 U/L (ref 11–51)

## 2019-01-12 MED ORDER — PROMETHAZINE HCL 25 MG/ML IJ SOLN
6.2500 mg | Freq: Once | INTRAMUSCULAR | Status: AC
  Administered 2019-01-12: 6.25 mg via INTRAVENOUS
  Filled 2019-01-12: qty 1

## 2019-01-12 MED ORDER — IOHEXOL 300 MG/ML  SOLN
100.0000 mL | Freq: Once | INTRAMUSCULAR | Status: AC | PRN
  Administered 2019-01-12: 100 mL via INTRAVENOUS

## 2019-01-12 MED ORDER — SODIUM CHLORIDE 0.9% FLUSH: 3.0000 mL | Freq: Once | INTRAVENOUS | Status: DC

## 2019-01-12 MED ORDER — SODIUM CHLORIDE 0.9 % IV BOLUS
1000.0000 mL | Freq: Once | INTRAVENOUS | Status: AC
  Administered 2019-01-12: 1000 mL via INTRAVENOUS

## 2019-01-12 MED ORDER — ONDANSETRON HCL 4 MG/2ML IJ SOLN: 4.0000 mg | Freq: Once | INTRAMUSCULAR | Status: DC

## 2019-01-12 NOTE — Telephone Encounter (Signed)
Received a VM of pt asking for a stronger medication than Zofran that was called in by her PCP. Returned call. Pt has called her PCP and is currently at the ED. Pt is waiting to here back from her PCP.

## 2019-01-12 NOTE — ED Provider Notes (Signed)
[facility] EMERGENCY DEPARTMENT Provider Note   CSN: [id number] Arrival date & time: 01/12/19  1608     History   Chief Complaint Chief Complaint  Patient presents with  . Abdominal Pain  . covid positive    HPI Patricia Hodges is a 73 y.o. female past medical history significant for for diverticulosis, dysphagia, H. pylori, osteoarthritis presents to emergency department today with chief complaint of abdominal pain.  Patient states she tested positive for covid on 01/06/2019.  Abdominal pain is located in epigastric area. She describes the pain as "feels like something is tearing me up." Pain started approximately 2 weeks ago after taking Augmentin for possible sinus infection. She took 3 doses and had severe upset stomach. Her antibiotic was switched to azithromycin. Pt denies taking because she was too nervous to have negative side effects. She further describes the pain as feeling like her insides are twisting, but denies sharp, cramping, aching sensation. Pain radiates throughout her abdomen, does not radiate to her back. She rates the pain 5/10 in severity. Pain is constant. She reports associated nausea without emesis. She was prescribed zofran by pcp without symptom relief. She denies fever, chills, cough, chest pain, congestion,  urinary symptoms, diarrhea, blood in stool, constipation, lower extremity edema.  Abdominal surgical history includes cholecystectomy.  History provided by patient with additional history obtained from chart review.     Past Medical History:  Diagnosis Date  . Diverticulosis   . Dysphagia   . Esophageal reflux   . Gastroenteritis, eosinophilic   . H pylori ulcer   . High cholesterol   . Osteoarthritis   . S/P colonoscopy 2006   diverticulosis, due for repeat in 2016  . S/P endoscopy 2007   Schatzki's Ring    Patient Active Problem List   Diagnosis Date Noted  . Lumbar foraminal stenosis 09/06/2016  . Surgery, elective   . History  of colonic polyps   . ABDOMINAL BLOATING 11/11/2009  . HELICOBACTER PYLORI GASTRITIS 11/17/2007  . SCHATZKI'S RING 11/17/2007  . GERD 11/17/2007  . HIATAL HERNIA 11/17/2007  . EOSINOPHILIC GASTROENTERITIS 0000000  . Other spondylosis with radiculopathy, lumbar region 11/17/2007  . WEIGHT LOSS 11/17/2007  . NAUSEA 11/17/2007  . VOMITING 11/17/2007  . HEARTBURN 11/17/2007  . OTHER DYSPHAGIA 11/17/2007  . DIARRHEA 11/17/2007  . ABDOMINAL PAIN, HX OF 11/17/2007  . CHOLECYSTECTOMY, HX OF 11/17/2007    Past Surgical History:  Procedure Laterality Date  . ANTERIOR CERVICAL DECOMP/DISCECTOMY FUSION N/A 01/30/2016   Procedure: C5-6 Anterior Cervical Discectomy and Fusion, Allograft, Plate;  Surgeon: [name], MD;  Location: [facility];  Service: Orthopedics;  Laterality: N/A;  . bladder tack    . BUNIONECTOMY Right   . CARPAL TUNNEL RELEASE Left   . CATARACT EXTRACTION W/PHACO Right 11/28/2015   Procedure: CATARACT EXTRACTION PHACO AND INTRAOCULAR LENS PLACEMENT ([facility]) RIGHT;  Surgeon: Tonny Branch, MD;  Location: [facility];  Service: Ophthalmology;  Laterality: Right;  CDE: 6.96  . CATARACT EXTRACTION W/PHACO Left 12/15/2015   Procedure: CATARACT EXTRACTION PHACO AND INTRAOCULAR LENS PLACEMENT LEFT EYE CDE=9.04;  Surgeon: Tonny Branch, MD;  Location: [facility];  Service: Ophthalmology;  Laterality: Left;  left  . CESAREAN SECTION    . CHOLECYSTECTOMY    . COLONOSCOPY  2006  . COLONOSCOPY N/A 09/08/2014   Procedure: COLONOSCOPY;  Surgeon: Daneil Dolin, MD;  [facility];  Service: Endoscopy;  Laterality: N/A;  10:30 AM  . COLONOSCOPY N/A 11/20/2017   Procedure:  COLONOSCOPY;  Surgeon: Daneil Dolin, MD;  [facility];  Service: Endoscopy;  Laterality: N/A;  10:30  . feet surgery    . HAND SURGERY Right    on ring finger due to swollen knuckle  . Hx schatski's ring    . POLYPECTOMY  11/20/2017   Procedure: POLYPECTOMY;  Surgeon: Daneil Dolin, MD;  [facility];   Service: Endoscopy;;  ascending colon (CSx1) descending colon(CSx1)  . S/P Hysterectomy       OB History   No obstetric history on file.      Home Medications    Prior to Admission medications   Medication Sig Start Date End Date Taking? Authorizing Provider  azithromycin (ZITHROMAX) 250 MG tablet Take 250-500 mg by mouth See admin instructions. 500mg  on day 1 starting on 01/09/2019, then take 250mg  on days 2 through 5 01/09/19  Yes [provider]  CALCIUM-VITAMIN D PO Take 1 tablet by mouth daily.   Yes [provider]  FLUoxetine (PROZAC) 10 MG tablet Take 10 mg by mouth every other day.    Yes [provider]  HYDROcodone-acetaminophen (NORCO/VICODIN) 5-325 MG tablet Take 1 tablet by mouth daily as needed for moderate pain.    Yes [provider]  omeprazole (PRILOSEC OTC) 20 MG tablet Take 20 mg by mouth daily.   Yes [provider]  ondansetron (ZOFRAN) 4 MG tablet Take 4 mg by mouth every 6 (six) hours as needed for nausea or vomiting.  01/09/19  Yes [provider]  cephALEXin (KEFLEX) 500 MG capsule Take 500 mg by mouth 4 (four) times daily. 10 day course starting on 12/29/2018 01/01/19   [provider]    Family History Family History  Problem Relation Age of Onset  . Colon cancer Neg Hx     Social History Social History   Tobacco Use  . Smoking status: Former Smoker    Packs/day: 1.00    Years: 20.00    Pack years: 20.00    Types: Cigarettes    Quit date: 11/23/1978    Years since quitting: 40.1  . Smokeless tobacco: Never Used  . Tobacco comment: quit about 30 yrs ago  Substance Use Topics  . Alcohol use: No  . Drug use: No     Allergies   Alendronate sodium, Aspirin, Augmentin [amoxicillin-pot clavulanate], Celecoxib, Ezetimibe-simvastatin, Rofecoxib, and Statins   Review of Systems Review of Systems  Constitutional: Negative for chills and fever.  HENT: Negative for congestion, ear  discharge, ear pain, sinus pressure, sinus pain and sore throat.   Eyes: Negative for pain and redness.  Respiratory: Negative for cough and shortness of breath.   Cardiovascular: Negative for chest pain.  Gastrointestinal: Positive for abdominal pain and nausea. Negative for abdominal distention, blood in stool, constipation, diarrhea and vomiting.  Genitourinary: Negative for dysuria and hematuria.  Musculoskeletal: Negative for back pain and neck pain.  Skin: Negative for wound.  Neurological: Negative for weakness, numbness and headaches.     Physical Exam Updated Vital Signs BP 104/87 (BP Location: Right Arm)   Pulse 72   Temp 98 F (36.7 C) (Oral)   Resp 18   Ht 5\' 4"  (1.626 m)   Wt 81.6 kg   SpO2 97%   BMI 30.90 kg/m   Physical Exam Vitals signs and nursing note reviewed.  Constitutional:      General: She is not in acute distress.    Appearance: She is not ill-appearing.  HENT:  Head: Normocephalic and atraumatic.     Right Ear: Tympanic membrane and external ear normal.     Left Ear: Tympanic membrane and external ear normal.     Nose: Nose normal.     Mouth/Throat:     Mouth: Mucous membranes are moist.     Pharynx: Oropharynx is clear.  Eyes:     General: No scleral icterus.       Right eye: No discharge.        Left eye: No discharge.     Extraocular Movements: Extraocular movements intact.     Conjunctiva/sclera: Conjunctivae normal.     Pupils: Pupils are equal, round, and reactive to light.  Neck:     Musculoskeletal: Normal range of motion.     Vascular: No JVD.  Cardiovascular:     Rate and Rhythm: Normal rate and regular rhythm.     Pulses: Normal pulses.          Radial pulses are 2+ on the right side and 2+ on the left side.     Heart sounds: Normal heart sounds.  Pulmonary:     Comments: Lungs clear to auscultation in all fields. Symmetric chest rise. No wheezing, rales, or rhonchi. Abdominal:     Tenderness: There is abdominal  tenderness in the epigastric area. There is no right CVA tenderness or left CVA tenderness. Negative signs include Rovsing's sign, McBurney's sign and psoas sign.     Comments: Abdomen is soft, non-distended. No rigidity, no guarding. No peritoneal signs.  Musculoskeletal: Normal range of motion.     Right lower leg: No edema.     Left lower leg: No edema.  Skin:    General: Skin is warm and dry.     Capillary Refill: Capillary refill takes less than 2 seconds.  Neurological:     Mental Status: She is oriented to person, place, and time.     GCS: GCS eye subscore is 4. GCS verbal subscore is 5. GCS motor subscore is 6.     Coordination: Abnormal coordination:       Comments: Fluent speech, no facial droop.  Psychiatric:        Behavior: Behavior normal.      ED Treatments / Results  Labs (all labs ordered are listed, but only abnormal results are displayed) Labs Reviewed  COMPREHENSIVE METABOLIC PANEL - Abnormal; Notable for the following components:      Result Value   Glucose, Bld 110 (*)    Calcium 8.6 (*)    AST 62 (*)    ALT 78 (*)    Alkaline Phosphatase 130 (*)    All other components within normal limits  CBC - Abnormal; Notable for the following components:   HCT 47.2 (*)    All other components within normal limits  LIPASE, BLOOD  URINALYSIS, ROUTINE W REFLEX MICROSCOPIC    EKG EKG Interpretation  Date/Time:  Monday January 12 2019 16:57:20 EDT Ventricular Rate:  75 PR Interval:  146 QRS Duration: 72 QT Interval:  370 QTC Calculation: 413 R Axis:   63 Text Interpretation: Normal sinus rhythm Nonspecific ST abnormality Abnormal ECG No STEMI Confirmed by Nanda Quinton [phone]) on 01/12/2019 5:19:12 PM   Radiology No results found.  Procedures Procedures (including critical care time)  Medications Ordered in ED Medications  sodium chloride flush (NS) 0.9 % injection 3 mL (has no administration in time range)  sodium chloride 0.9 % bolus 1,000 mL (has  no administration in time range)  promethazine (PHENERGAN) injection 6.25 mg (has no administration in time range)     Initial Impression / Assessment and Plan / ED Course  I have reviewed the triage vital signs and the nursing notes.  Pertinent labs & imaging results that were available during my care of the patient were reviewed by me and considered in my medical decision making (see chart for details).  CORNETTA KEISTER was evaluated in Emergency Department on 01/12/2019 for the symptoms described in the history of present illness. She was evaluated in the context of the global COVID-19 pandemic, which necessitated consideration that the patient might be at risk for infection with the SARS-CoV-2 virus that causes COVID-19. Institutional protocols and algorithms that pertain to the evaluation of patients at risk for COVID-19 are in a state of rapid change based on information released by regulatory bodies including the CDC and federal and state organizations. These policies and algorithms were followed during the patient's care in the ED.   Patient seen and examined. Patient nontoxic appearing, in no apparent distress.  She is afebrile, normotensive, no tachycardia or hypoxia.  Her lungs are clear to auscultation in all fields.  She has tenderness palpation of epigastric area.  Normoactive bowel sounds.  No CVA tenderness.  I reviewed labs ordered in triage.  CBC without leukocytosis, no anemia.  CMP with elevated liver enzymes, no severe electrolyte derangement, no renal insufficiencies.  Lipase is within normal range.  EKG viewed by me without ischemic changes. UA pending.   Case discussed with ED attending Dr. Laverta Baltimore who agrees with plans to  CT abdomen pelvis given her tenderness on exam.  Chest x-ray also ordered.  Will attempt symptom control with very small dose of IV Phenergan as patient reports no relief with Zofran and give IV fluids.  Patient care transferred to J. Idol PA-C at the  end of my shift. Patient presentation, ED course, and plan of care discussed with review of all pertinent labs and imaging. Please see her note for further details regarding further ED course and disposition.  If imaging negative and symptoms are controlled anticipate discharge home with pcp follow up.   Portions of this note were generated with [employer]. Dictation errors may occur despite best attempts at proofreading.    Final Clinical Impressions(s) / ED Diagnoses   Final diagnoses:  None    ED Discharge Orders    None       Flint Melter 01/12/19 2207    Margette Fast, MD 01/13/19 1052

## 2019-01-12 NOTE — ED Triage Notes (Addendum)
Pt reports that she was dx with covid last Wednesday Pt reports pain epigastric area and upper abdomen. Homehealth nurse recommended she come to ED. Some coughing. Daughter in law reports she has been sick 3 weeks and not eating . No taste.  Pt has been on zpack for sinus infection per family

## 2019-01-13 MED ORDER — PROMETHAZINE HCL 12.5 MG PO TABS: 12.5000 mg | ORAL_TABLET | Freq: Four times a day (QID) | ORAL | 0 refills | Status: DC | PRN

## 2019-01-13 NOTE — ED Provider Notes (Signed)
Pt signed out from [employer], PA-C, known Covid positive tested 7 days ago with nausea and epigastric pain after taking augmentin for presumptive sinusitis, reporting took 3 tablets, last dose 5 morning ago, dc'd after she developed nausea and stomach upset not improved with zofran prescribed by pcp.  No sob, no fevers or cough.  No further sinus sx (described had a headache behind eyes, no congestion).   Given phenergan here with improved nausea, able to tolerate PO intake here. Phenergan prescribed for home use - cautioned re sedation.   CXR and abd CT scan with results below, normal CT except for multifocal airspace opacities bilateral lung bases c/w her known Covid infection. She denies any significant coughing and has had no SOB.  Pt was ambulated in the ed with pulse ox.  No complaint of sob, pulse ox 97% on room air, no tachypnea present.  Discussed findings with dg in law Kathryne Eriksson who was under the impression pt had taken her z pack. Per pt was hesitant to take.  Strongly encouraged to take this medication given imaging findings.  Discussed close f/u for any worsened sob, fevers, weakness.  Clinically stable for dc home.     Dg Chest 2 View  Result Date: 01/12/2019 CLINICAL DATA:  Shortness of breath. EXAM: CHEST - 2 VIEW COMPARISON:  June 03, 2017 FINDINGS: Multifocal airspace opacities are noted, greatest at the lung bases. There are prominent interstitial lung markings bilaterally. The heart size is borderline enlarged. Aortic calcifications are noted. There is no acute osseous abnormality. The patient is status post ACDF of the lower cervical spine. IMPRESSION: Multifocal airspace opacities bilaterally consistent with the patient's history of viral pneumonia. Electronically Signed   By: Constance Holster M.D.   On: 01/12/2019 23:30   Ct Abdomen Pelvis W Contrast  Result Date: 01/12/2019 CLINICAL DATA:  Epigastric pain. EXAM: CT ABDOMEN AND PELVIS WITH CONTRAST TECHNIQUE:  Multidetector CT imaging of the abdomen and pelvis was performed using the standard protocol following bolus administration of intravenous contrast. CONTRAST:  159mL OMNIPAQUE IOHEXOL 300 MG/ML  SOLN COMPARISON:  None. FINDINGS: Lower chest: There are few scattered ground-glass airspace opacities involving the lung bases bilaterally. Atelectasis versus consolidation is noted at the lung bases.The heart size is normal. Hepatobiliary: There is decreased hepatic attenuation suggestive of hepatic steatosis. Status post cholecystectomy.There is no biliary ductal dilation. Pancreas: Normal contours without ductal dilatation. No peripancreatic fluid collection. Spleen: No splenic laceration or hematoma. Adrenals/Urinary Tract: --Adrenal glands: No adrenal hemorrhage. --Right kidney/ureter: No hydronephrosis or perinephric hematoma. --Left kidney/ureter: No hydronephrosis or perinephric hematoma. --Urinary bladder: Unremarkable. Stomach/Bowel: --Stomach/Duodenum: No hiatal hernia or other gastric abnormality. Normal duodenal course and caliber. There is a posterior gastric diverticulum. --Small bowel: No dilatation or inflammation. --Colon: No focal abnormality. --Appendix: Normal. Vascular/Lymphatic: Atherosclerotic calcification is present within the non-aneurysmal abdominal aorta, without hemodynamically significant stenosis. --No retroperitoneal lymphadenopathy. --No mesenteric lymphadenopathy. --No pelvic or inguinal lymphadenopathy. Reproductive: Status post hysterectomy. No adnexal mass. Other: No ascites or free air. The abdominal wall is normal. Musculoskeletal. No acute displaced fractures. IMPRESSION: 1. No acute intra-abdominal abnormality. 2. Multifocal ground-glass airspace opacities involving the bilateral lung bases consistent with the patient's history of viral pneumonia. 3. Hepatic steatosis.  Status post cholecystectomy. 4.  Aortic Atherosclerosis (ICD10-I70.0). Electronically Signed   By: Constance Holster M.D.   On: 01/12/2019 23:25   Dg Chest Portable 1 View  Result Date: 01/06/2019 CLINICAL DATA:  Fever and chest pain EXAM: PORTABLE CHEST 1 VIEW  COMPARISON:  06/03/2017 FINDINGS: Surgical hardware in the cervical spine. No focal airspace disease or effusion. Cardiomediastinal silhouette within normal limits. Aortic atherosclerosis. No pneumothorax. IMPRESSION: No active disease. Electronically Signed   By: Donavan Foil M.D.   On: 01/06/2019 15:57      Evalee Jefferson, PA-C 01/13/19 0134    Long, [employer], MD 01/13/19 1053

## 2019-01-13 NOTE — Discharge Instructions (Addendum)
You may continue taking the phenergan prescribed if needed for any persistent nausea. Take this in place of your zofran since this medicine was not working. Your xray and CT scans are ok tonight.  There is some evidence for mild lung changes consistent with your Covid 19 infection.  Make sure you complete the entire course of your new antibiotic zithromax that was prescribed by your MD.  There is no need for any additional treatment for this finding unless you develop shortness of breath, fevers or weakness.  If you develop any worsened symptoms, contact your primary doctor or return here.

## 2019-01-13 NOTE — ED Notes (Signed)
Pt ambulated 02 started at 97% walked to bathroom and back to room with no assisting 02 ended at 97%

## 2019-03-26 ENCOUNTER — Other Ambulatory Visit: Payer: Self-pay

## 2019-03-26 ENCOUNTER — Ambulatory Visit (INDEPENDENT_AMBULATORY_CARE_PROVIDER_SITE_OTHER): Payer: Medicare HMO | Admitting: Orthopaedic Surgery

## 2019-03-26 ENCOUNTER — Encounter: Payer: Self-pay | Admitting: Orthopaedic Surgery

## 2019-03-26 ENCOUNTER — Ambulatory Visit (INDEPENDENT_AMBULATORY_CARE_PROVIDER_SITE_OTHER): Payer: Medicare HMO

## 2019-03-26 VITALS — Ht 64.0 in | Wt 180.0 lb

## 2019-03-26 DIAGNOSIS — M25552 Pain in left hip: Secondary | ICD-10-CM | POA: Diagnosis not present

## 2019-03-26 DIAGNOSIS — M7062 Trochanteric bursitis, left hip: Secondary | ICD-10-CM | POA: Diagnosis not present

## 2019-03-26 MED ORDER — LIDOCAINE HCL 1 % IJ SOLN
0.5000 mL | INTRAMUSCULAR | Status: AC | PRN
  Administered 2019-03-26: 14:00:00 .5 mL

## 2019-03-26 MED ORDER — METHYLPREDNISOLONE ACETATE 40 MG/ML IJ SUSP
40.0000 mg | INTRAMUSCULAR | Status: AC | PRN
  Administered 2019-03-26: 14:00:00 40 mg via INTRA_ARTICULAR

## 2019-03-26 MED ORDER — BUPIVACAINE HCL 0.25 % IJ SOLN
2.0000 mL | INTRAMUSCULAR | Status: AC | PRN
  Administered 2019-03-26: 2 mL via INTRA_ARTICULAR

## 2019-03-26 NOTE — Progress Notes (Signed)
Office Visit Note   Patient: Patricia Hodges           Date of Birth: 1945-05-19           MRN: [id number] Visit Date: 03/26/2019              Requested by: Sharilyn Sites, [address] [place],  [place] [address] PCP: Sharilyn Sites, MD   Assessment & Plan: Visit Diagnoses:  1. Pain in left hip   2. Trochanteric bursitis, left hip     Plan: Trochanteric injection performed, left.  She will let us know if she does not get continued relief.  Follow-Up Instructions: No follow-ups on file.   Orders:  Orders Placed This Encounter  Procedures  . Large Joint Inj  . XR HIP UNILAT W OR W/O PELVIS 2-3 VIEWS LEFT   No orders of the defined types were placed in this encounter.     Procedures: Large Joint Inj: L greater trochanter on 03/26/2019 2:09 PM Details: lateral approach Medications: 0.5 mL lidocaine 1 %; 2 mL bupivacaine 0.25 %; 40 mg methylPREDNISolone acetate 40 MG/ML      Clinical Data: No additional findings.   Subjective: Chief Complaint  Patient presents with  . Left Hip - Pain    HPI 74 year old female returns with 3 to 4-week history of left groin pain and pain that radiates around from the groin to the left sciatic notch region.  Past history of remote L1 inferior compression which on MRI scan 10/01/2018 was a Schmorl's node.  Patient has some disc degeneration and facet arthropathy at L4-5 L5-S1 neural foraminal narrowing worse on the right than her symptomatic left side.  She denies chills or fever no history of gout.  Review of Systems positive for some facet arthropathy L4-5 L5-S1 and foraminal stenosis.  History of either eosinophilic gastroenteritis.  Previous cholecystectomy plus for heartburn.  C5-6 ACDF 2017 doing well.  Otherwise 14 point systems negative is obtains HPI.   Objective: Vital Signs: Ht 5\' 4"  (1.626 m)   Wt 180 lb (81.6 kg)   BMI 30.90 kg/m   Physical Exam Constitutional:      Appearance: She is well-developed.   HENT:     Head: Normocephalic.     Right Ear: External ear normal.     Left Ear: External ear normal.  Eyes:     Pupils: Pupils are equal, round, and reactive to light.  Neck:     Thyroid: No thyromegaly.     Trachea: No tracheal deviation.  Cardiovascular:     Rate and Rhythm: Normal rate.  Pulmonary:     Effort: Pulmonary effort is normal.  Abdominal:     Palpations: Abdomen is soft.  Skin:    General: Skin is warm and dry.  Neurological:     Mental Status: She is alert and oriented to person, place, and time.  Psychiatric:        Behavior: Behavior normal.     Ortho Exam well-healed left-sided neck incision negative Spurling good cervical range of motion.  Lower extremity reflexes are normal negative logroll on the right some discomfort internal rotation 30 degrees left.  She has exquisite tenderness of the greater trochanter bursa on the left minimal on the right.  Mild sciatic notch tenderness skin of the lumbar region is normal.  Specialty Comments:  No specialty comments available.  Imaging: XR HIP UNILAT W OR W/O PELVIS 2-3 VIEWS LEFT  Result Date: 03/26/2019 [facility] pelvis show hips and  frog-leg left hip obtained and reviewed this shows no soft tissue calcification no hip osteoarthritis femoral neck is normal. Impression: Left hip x-rays negative for arthritis or acute changes.    PMFS History: Patient Active Problem List   Diagnosis Date Noted  . Trochanteric bursitis, left hip 03/26/2019  . Lumbar foraminal stenosis 09/06/2016  . Surgery, elective   . History of colonic polyps   . ABDOMINAL BLOATING 11/11/2009  . HELICOBACTER PYLORI GASTRITIS 11/17/2007  . SCHATZKI'S RING 11/17/2007  . GERD 11/17/2007  . HIATAL HERNIA 11/17/2007  . EOSINOPHILIC GASTROENTERITIS 0000000  . Other spondylosis with radiculopathy, lumbar region 11/17/2007  . WEIGHT LOSS 11/17/2007  . NAUSEA 11/17/2007  . VOMITING 11/17/2007  . HEARTBURN 11/17/2007  . OTHER DYSPHAGIA 11/17/2007   . DIARRHEA 11/17/2007  . ABDOMINAL PAIN, HX OF 11/17/2007  . CHOLECYSTECTOMY, HX OF 11/17/2007   Past Medical History:  Diagnosis Date  . Diverticulosis   . Dysphagia   . Esophageal reflux   . Gastroenteritis, eosinophilic   . H pylori ulcer   . High cholesterol   . Osteoarthritis   . S/P colonoscopy 2006   diverticulosis, due for repeat in 2016  . S/P endoscopy 2007   Schatzki's Ring    Family History  Problem Relation Age of Onset  . Colon cancer Neg Hx     Past Surgical History:  Procedure Laterality Date  . ANTERIOR CERVICAL DECOMP/DISCECTOMY FUSION N/A 01/30/2016   Procedure: C5-6 Anterior Cervical Discectomy and Fusion, Allograft, Plate;  Surgeon: [name], MD;  Location: [facility];  Service: Orthopedics;  Laterality: N/A;  . bladder tack    . BUNIONECTOMY Right   . CARPAL TUNNEL RELEASE Left   . CATARACT EXTRACTION W/PHACO Right 11/28/2015   Procedure: CATARACT EXTRACTION PHACO AND INTRAOCULAR LENS PLACEMENT ([place]) RIGHT;  Surgeon: Tonny Branch, MD;  Location: [facility];  Service: Ophthalmology;  Laterality: Right;  CDE: 6.96  . CATARACT EXTRACTION W/PHACO Left 12/15/2015   Procedure: CATARACT EXTRACTION PHACO AND INTRAOCULAR LENS PLACEMENT LEFT EYE CDE=9.04;  Surgeon: Tonny Branch, MD;  Location: [facility];  Service: Ophthalmology;  Laterality: Left;  left  . CESAREAN SECTION    . CHOLECYSTECTOMY    . COLONOSCOPY  2006  . COLONOSCOPY N/A 09/08/2014   Procedure: COLONOSCOPY;  Surgeon: Daneil Dolin, MD;  [facility];  Service: Endoscopy;  Laterality: N/A;  10:30 AM  . COLONOSCOPY N/A 11/20/2017   Procedure: COLONOSCOPY;  Surgeon: Daneil Dolin, MD;  [facility];  Service: Endoscopy;  Laterality: N/A;  10:30  . feet surgery    . HAND SURGERY Right    on ring finger due to swollen knuckle  . Hx schatski's ring    . POLYPECTOMY  11/20/2017   Procedure: POLYPECTOMY;  Surgeon: Daneil Dolin, MD;  [facility];  Service: Endoscopy;;  ascending  colon (CSx1) descending colon(CSx1)  . S/P Hysterectomy     Social History   Occupational History  . Not on file  Tobacco Use  . Smoking status: Former Smoker    Packs/day: 1.00    Years: 20.00    Pack years: 20.00    Types: Cigarettes    Quit date: 11/23/1978    Years since quitting: 40.3  . Smokeless tobacco: Never Used  . Tobacco comment: quit about 30 yrs ago  Substance and Sexual Activity  . Alcohol use: No  . Drug use: No  . Sexual activity: Not on file

## 2019-05-12 DIAGNOSIS — M7732 Calcaneal spur, left foot: Secondary | ICD-10-CM | POA: Diagnosis not present

## 2019-05-12 DIAGNOSIS — M79671 Pain in right foot: Secondary | ICD-10-CM | POA: Diagnosis not present

## 2019-05-12 DIAGNOSIS — M7731 Calcaneal spur, right foot: Secondary | ICD-10-CM | POA: Diagnosis not present

## 2019-05-12 DIAGNOSIS — M722 Plantar fascial fibromatosis: Secondary | ICD-10-CM | POA: Diagnosis not present

## 2019-05-13 DIAGNOSIS — E559 Vitamin D deficiency, unspecified: Secondary | ICD-10-CM | POA: Diagnosis not present

## 2019-05-13 DIAGNOSIS — Z6831 Body mass index (BMI) 31.0-31.9, adult: Secondary | ICD-10-CM | POA: Diagnosis not present

## 2019-05-13 DIAGNOSIS — M159 Polyosteoarthritis, unspecified: Secondary | ICD-10-CM | POA: Diagnosis not present

## 2019-05-13 DIAGNOSIS — Z1389 Encounter for screening for other disorder: Secondary | ICD-10-CM | POA: Diagnosis not present

## 2019-05-13 DIAGNOSIS — Z23 Encounter for immunization: Secondary | ICD-10-CM | POA: Diagnosis not present

## 2019-05-13 DIAGNOSIS — Z0001 Encounter for general adult medical examination with abnormal findings: Secondary | ICD-10-CM | POA: Diagnosis not present

## 2019-05-13 DIAGNOSIS — R69 Illness, unspecified: Secondary | ICD-10-CM | POA: Diagnosis not present

## 2019-05-13 DIAGNOSIS — E785 Hyperlipidemia, unspecified: Secondary | ICD-10-CM | POA: Diagnosis not present

## 2019-05-13 DIAGNOSIS — Z0000 Encounter for general adult medical examination without abnormal findings: Secondary | ICD-10-CM | POA: Diagnosis not present

## 2019-05-13 DIAGNOSIS — R7309 Other abnormal glucose: Secondary | ICD-10-CM | POA: Diagnosis not present

## 2019-05-13 DIAGNOSIS — K219 Gastro-esophageal reflux disease without esophagitis: Secondary | ICD-10-CM | POA: Diagnosis not present

## 2019-05-20 ENCOUNTER — Other Ambulatory Visit (HOSPITAL_COMMUNITY): Payer: Self-pay | Admitting: Family Medicine

## 2019-05-20 DIAGNOSIS — E2839 Other primary ovarian failure: Secondary | ICD-10-CM

## 2019-06-02 DIAGNOSIS — M7732 Calcaneal spur, left foot: Secondary | ICD-10-CM | POA: Diagnosis not present

## 2019-06-02 DIAGNOSIS — M722 Plantar fascial fibromatosis: Secondary | ICD-10-CM | POA: Diagnosis not present

## 2019-06-02 DIAGNOSIS — M7731 Calcaneal spur, right foot: Secondary | ICD-10-CM | POA: Diagnosis not present

## 2019-06-02 DIAGNOSIS — M79672 Pain in left foot: Secondary | ICD-10-CM | POA: Diagnosis not present

## 2019-06-02 DIAGNOSIS — M79671 Pain in right foot: Secondary | ICD-10-CM | POA: Diagnosis not present

## 2019-06-25 DIAGNOSIS — M79672 Pain in left foot: Secondary | ICD-10-CM | POA: Diagnosis not present

## 2019-06-25 DIAGNOSIS — M79671 Pain in right foot: Secondary | ICD-10-CM | POA: Diagnosis not present

## 2019-06-25 DIAGNOSIS — M722 Plantar fascial fibromatosis: Secondary | ICD-10-CM | POA: Diagnosis not present

## 2019-06-25 DIAGNOSIS — M7732 Calcaneal spur, left foot: Secondary | ICD-10-CM | POA: Diagnosis not present

## 2019-06-25 DIAGNOSIS — M7731 Calcaneal spur, right foot: Secondary | ICD-10-CM | POA: Diagnosis not present

## 2019-07-09 ENCOUNTER — Encounter: Payer: Self-pay | Admitting: Orthopaedic Surgery

## 2019-07-09 ENCOUNTER — Ambulatory Visit (INDEPENDENT_AMBULATORY_CARE_PROVIDER_SITE_OTHER): Payer: Medicare HMO | Admitting: Orthopaedic Surgery

## 2019-07-09 ENCOUNTER — Ambulatory Visit: Payer: Self-pay

## 2019-07-09 ENCOUNTER — Other Ambulatory Visit: Payer: Self-pay

## 2019-07-09 ENCOUNTER — Ambulatory Visit (INDEPENDENT_AMBULATORY_CARE_PROVIDER_SITE_OTHER): Payer: Medicare HMO

## 2019-07-09 VITALS — Ht 64.0 in | Wt 180.0 lb

## 2019-07-09 DIAGNOSIS — M7661 Achilles tendinitis, right leg: Secondary | ICD-10-CM | POA: Diagnosis not present

## 2019-07-09 DIAGNOSIS — M79672 Pain in left foot: Secondary | ICD-10-CM

## 2019-07-09 DIAGNOSIS — M79671 Pain in right foot: Secondary | ICD-10-CM

## 2019-07-09 NOTE — Progress Notes (Signed)
Office Visit Note   Patient: Patricia Hodges           Date of Birth: 13-May-1945           MRN: [id number] Visit Date: 07/09/2019              Requested by: Sharilyn Sites, [address] [place],  [place] [address] PCP: Sharilyn Sites, MD   Assessment & Plan: Visit Diagnoses:  1. Bilateral foot pain   2. Tendonitis, Achilles, right     Plan: Patient has  Right Achilles insertional tendinopathy with a pop month.  We will place her in a cam boot to rest the Achilles tendon.  We discussed that if she has persistent symptoms then diagnostic MRI scan of the needed.  Recheck 6 weeks.  Follow-Up Instructions: Return in about 6 weeks (around 08/20/2019).   Orders:  Orders Placed This Encounter  Procedures  . XR Foot Complete Right   No orders of the defined types were placed in this encounter.     Procedures: No procedures performed   Clinical Data: No additional findings.   Subjective: Chief Complaint  Patient presents with  . Right Foot - Pain  . Left Foot - Pain    HPI 74 year old female seen with the right more than left foot pain.  She has had problems since October she has had multiple injection she has pain at night and points posteriorly to the heel where she has problems.  Problems going up stairs she is taking over-the-counter medications have not gotten improvement.  Previous bunionette surgery did well.  She has had several cortisone injections in her foot with recurrence of symptoms.  Review of Systems 14 point systems updated noncontributory.   Objective: Vital Signs: Ht 5\' 4"  (1.626 m)   Wt 180 lb (81.6 kg)   BMI 30.90 kg/m   Physical Exam Constitutional:      Appearance: She is well-developed.  HENT:     Head: Normocephalic.     Right Ear: External ear normal.     Left Ear: External ear normal.  Eyes:     Pupils: Pupils are equal, round, and reactive to light.  Neck:     Thyroid: No thyromegaly.     Trachea: No tracheal deviation.    Cardiovascular:     Rate and Rhythm: Normal rate.  Pulmonary:     Effort: Pulmonary effort is normal.  Abdominal:     Palpations: Abdomen is soft.  Skin:    General: Skin is warm and dry.  Neurological:     Mental Status: She is alert and oriented to person, place, and time.  Psychiatric:        Behavior: Behavior normal.     Ortho Exam patient has tenderness at the Achilles tendon insertion site without erythema or cellulitis.  Pain with resisted gastroc contracture and she points directly to the Achilles tendon insertion.  Plantar fascial origin is minimally tender.  Normal subtalar motion. Specialty Comments:  No specialty comments available.  Imaging: No results found.   PMFS History: Patient Active Problem List   Diagnosis Date Noted  . Tendonitis, Achilles, right 07/13/2019  . Trochanteric bursitis, left hip 03/26/2019  . Lumbar foraminal stenosis 09/06/2016  . Surgery, elective   . History of colonic polyps   . ABDOMINAL BLOATING 11/11/2009  . HELICOBACTER PYLORI GASTRITIS 11/17/2007  . SCHATZKI'S RING 11/17/2007  . GERD 11/17/2007  . HIATAL HERNIA 11/17/2007  . EOSINOPHILIC GASTROENTERITIS 0000000  . Other spondylosis  with radiculopathy, lumbar region 11/17/2007  . WEIGHT LOSS 11/17/2007  . NAUSEA 11/17/2007  . VOMITING 11/17/2007  . HEARTBURN 11/17/2007  . OTHER DYSPHAGIA 11/17/2007  . DIARRHEA 11/17/2007  . ABDOMINAL PAIN, HX OF 11/17/2007  . CHOLECYSTECTOMY, HX OF 11/17/2007   Past Medical History:  Diagnosis Date  . Diverticulosis   . Dysphagia   . Esophageal reflux   . Gastroenteritis, eosinophilic   . H pylori ulcer   . High cholesterol   . Osteoarthritis   . S/P colonoscopy 2006   diverticulosis, due for repeat in 2016  . S/P endoscopy 2007   Schatzki's Ring    Family History  Problem Relation Age of Onset  . Colon cancer Neg Hx     Past Surgical History:  Procedure Laterality Date  . ANTERIOR CERVICAL DECOMP/DISCECTOMY FUSION N/A  01/30/2016   Procedure: C5-6 Anterior Cervical Discectomy and Fusion, Allograft, Plate;  Surgeon: [name], MD;  Location: [facility];  Service: Orthopedics;  Laterality: N/A;  . bladder tack    . BUNIONECTOMY Right   . CARPAL TUNNEL RELEASE Left   . CATARACT EXTRACTION W/PHACO Right 11/28/2015   Procedure: CATARACT EXTRACTION PHACO AND INTRAOCULAR LENS PLACEMENT ([place]) RIGHT;  Surgeon: Tonny Branch, MD;  [facility];  Service: Ophthalmology;  Laterality: Right;  CDE: 6.96  . CATARACT EXTRACTION W/PHACO Left 12/15/2015   Procedure: CATARACT EXTRACTION PHACO AND INTRAOCULAR LENS PLACEMENT LEFT EYE CDE=9.04;  Surgeon: Tonny Branch, MD;  [facility]: [facility];  Service: Ophthalmology;  Laterality: Left;  left  . CESAREAN SECTION    . CHOLECYSTECTOMY    . COLONOSCOPY  2006  . COLONOSCOPY N/A 09/08/2014   Procedure: COLONOSCOPY;  Surgeon: Daneil Dolin, MD;  [facility];  Service: Endoscopy;  Laterality: N/A;  10:30 AM  . COLONOSCOPY N/A 11/20/2017   Procedure: COLONOSCOPY;  Surgeon: Daneil Dolin, MD;  [facility];  Service: Endoscopy;  Laterality: N/A;  10:30  . feet surgery    . HAND SURGERY Right    on ring finger due to swollen knuckle  . Hx schatski's ring    . POLYPECTOMY  11/20/2017   Procedure: POLYPECTOMY;  Surgeon: Daneil Dolin, MD;  [facility];  Service: Endoscopy;;  ascending colon (CSx1) descending colon(CSx1)  . S/P Hysterectomy     Social History   Occupational History  . Not on file  Tobacco Use  . Smoking status: Former Smoker    Packs/day: 1.00    Years: 20.00    Pack years: 20.00    Types: Cigarettes    Quit date: 11/23/1978    Years since quitting: 40.6  . Smokeless tobacco: Never Used  . Tobacco comment: quit about 30 yrs ago  Substance and Sexual Activity  . Alcohol use: No  . Drug use: No  . Sexual activity: Not on file

## 2019-07-13 DIAGNOSIS — M7661 Achilles tendinitis, right leg: Secondary | ICD-10-CM | POA: Insufficient documentation

## 2019-07-17 DIAGNOSIS — E7849 Other hyperlipidemia: Secondary | ICD-10-CM | POA: Diagnosis not present

## 2019-07-17 DIAGNOSIS — M810 Age-related osteoporosis without current pathological fracture: Secondary | ICD-10-CM | POA: Diagnosis not present

## 2019-07-17 DIAGNOSIS — R69 Illness, unspecified: Secondary | ICD-10-CM | POA: Diagnosis not present

## 2019-07-17 DIAGNOSIS — K219 Gastro-esophageal reflux disease without esophagitis: Secondary | ICD-10-CM | POA: Diagnosis not present

## 2019-07-20 ENCOUNTER — Telehealth: Payer: Self-pay | Admitting: Radiology

## 2019-07-20 NOTE — Telephone Encounter (Signed)
Please see message from Patricia Hodges below and advise on boot use.  Pt has called concerning when she is required to wear the boot. I instructed when up and mobile,but she was mainly concerned about when sleeping-I told her I didn't think she needed to at night. Please call pt with instructions for boot use.

## 2019-07-20 NOTE — Telephone Encounter (Signed)
OK remove at night

## 2019-07-20 NOTE — Telephone Encounter (Signed)
I called patient and advised. 

## 2019-07-22 ENCOUNTER — Encounter: Payer: Self-pay | Admitting: *Deleted

## 2019-07-22 ENCOUNTER — Ambulatory Visit: Payer: Medicare HMO | Admitting: Physician Assistant

## 2019-07-22 ENCOUNTER — Other Ambulatory Visit: Payer: Self-pay

## 2019-07-22 ENCOUNTER — Encounter: Payer: Self-pay | Admitting: Physician Assistant

## 2019-07-22 DIAGNOSIS — D044 Carcinoma in situ of skin of scalp and neck: Secondary | ICD-10-CM | POA: Diagnosis not present

## 2019-07-22 DIAGNOSIS — D2239 Melanocytic nevi of other parts of face: Secondary | ICD-10-CM | POA: Diagnosis not present

## 2019-07-22 DIAGNOSIS — L570 Actinic keratosis: Secondary | ICD-10-CM | POA: Diagnosis not present

## 2019-07-22 DIAGNOSIS — D485 Neoplasm of uncertain behavior of skin: Secondary | ICD-10-CM

## 2019-07-22 DIAGNOSIS — Z86007 Personal history of in-situ neoplasm of skin: Secondary | ICD-10-CM | POA: Diagnosis not present

## 2019-07-22 DIAGNOSIS — Z85828 Personal history of other malignant neoplasm of skin: Secondary | ICD-10-CM

## 2019-07-22 DIAGNOSIS — C4492 Squamous cell carcinoma of skin, unspecified: Secondary | ICD-10-CM

## 2019-07-22 DIAGNOSIS — B078 Other viral warts: Secondary | ICD-10-CM | POA: Diagnosis not present

## 2019-07-22 HISTORY — DX: Squamous cell carcinoma of skin, unspecified: C44.92

## 2019-07-22 NOTE — Patient Instructions (Signed)

## 2019-07-22 NOTE — Progress Notes (Signed)
Follow up Visit  Subjective  Patricia Hodges is a 74 y.o. female who presents for the following: Annual Exam (neck-rasied spot, right temple-sore spot, Left cheek- dark spot & dry patches on face). Anterior neck bump that has been there a couple months. It's sore and gets rubbed. Sore spot near right eye that has been there a few months. It feels raw when she puts her make up on. She has a dry patch left jawline that has been there a few months. It feels slightly raw and she covers it with make up. Also rough bumps chest that get rubbed and sore. History of BCC and CIS.    Objective  Well appearing patient in no apparent distress; mood and affect are within normal limits.  All skin waist up examined not including scalp. No suspicious moles noted on back.   Objective  Left Breast (3), Mid Root of Nose, Neck - Anterior, Right Breast (3), Right Forearm - Anterior (5): Erythematous patches with gritty scale.  Objective  Left Lower Back: Surgical scar examined no sign of recurrence.   Objective  Suprasternal Area: Warty papule     Objective  Left Anterior Neck: Crusted papule     Objective  Right Temple: Scaling pink papule     Objective  Left Anterior Mandible: Scaling patch     Objective  Left Buccal Cheek : Dyspigmented scar.   Assessment & Plan  AK (actinic keratosis) (13) Neck - Anterior; Right Forearm - Anterior (5); Left Breast (3); Right Breast (3); Mid Root of Nose  Destruction of lesion - Left Breast, Mid Root of Nose, Neck - Anterior, Right Breast, Right Forearm - Anterior Complexity: simple   Destruction method: cryotherapy   Informed consent: discussed and consent obtained   Timeout:  patient name, date of birth, surgical site, and procedure verified Lesion destroyed using liquid nitrogen: Yes   Outcome: patient tolerated procedure well with no complications    History of basal cell carcinoma (BCC) Left Lower Back  Neoplasm of  uncertain behavior of skin (4) Suprasternal Area  Skin / nail biopsy Type of biopsy: tangential   Informed consent: discussed and consent obtained   Procedure prep:  Patient was prepped and draped in usual sterile fashion (Non sterile) Prep type:  Chlorhexidine Anesthesia: the lesion was anesthetized in a standard fashion   Anesthetic:  1% lidocaine w/ epinephrine 1-100,000 local infiltration Instrument used: flexible razor blade    Specimen 1 - Surgical pathology Differential Diagnosis: bcc vs scc Check Margins: No  Left Anterior Neck  Skin / nail biopsy Type of biopsy: tangential   Informed consent: discussed and consent obtained   Patient was prepped and draped in usual sterile fashion: Non sterile. Anesthesia: the lesion was anesthetized in a standard fashion   Anesthetic:  1% lidocaine w/ epinephrine 1-100,000 local infiltration Instrument used: flexible razor blade    Specimen 2 - Surgical pathology Differential Diagnosis: bcc vs scc Check Margins: No  Right Temple  Skin / nail biopsy Type of biopsy: tangential   Procedure prep:  Patient was prepped and draped in usual sterile fashion (Non sterile) Prep type:  Chlorhexidine Anesthesia: the lesion was anesthetized in a standard fashion   Anesthetic:  1% lidocaine w/ epinephrine 1-100,000 local infiltration Instrument used: flexible razor blade    Specimen 3 - Surgical pathology Differential Diagnosis: bcc vs scc Check Margins: No  Left Anterior Mandible  Skin / nail biopsy Type of biopsy: tangential   Informed consent: discussed and  consent obtained   Procedure prep:  Patient was prepped and draped in usual sterile fashion (Non sterile) Prep type:  Chlorhexidine Anesthesia: the lesion was anesthetized in a standard fashion   Anesthetic:  1% lidocaine w/ epinephrine 1-100,000 local infiltration Instrument used: flexible razor blade    Specimen 4 - Surgical pathology Differential Diagnosis: bcc vs scc Check  Margins: No  Personal history of in-situ neoplasm of skin Left Buccal Cheek

## 2019-07-27 ENCOUNTER — Encounter: Payer: Self-pay | Admitting: *Deleted

## 2019-07-27 ENCOUNTER — Telehealth: Payer: Self-pay | Admitting: *Deleted

## 2019-07-27 NOTE — Telephone Encounter (Signed)
-----   Message from Arlyss Gandy, PA-C sent at 07/27/2019  9:31 AM EDT ----- 30 min for left neck with me. May also freeze Aks at that visit.

## 2019-07-27 NOTE — Telephone Encounter (Signed)
Pathology to patient. Made surgery appointment with Arlyss Gandy at 10 August 20, 2019.

## 2019-08-13 ENCOUNTER — Ambulatory Visit: Payer: Self-pay

## 2019-08-13 ENCOUNTER — Ambulatory Visit (INDEPENDENT_AMBULATORY_CARE_PROVIDER_SITE_OTHER): Payer: Medicare HMO | Admitting: Orthopaedic Surgery

## 2019-08-13 ENCOUNTER — Encounter: Payer: Self-pay | Admitting: Orthopaedic Surgery

## 2019-08-13 ENCOUNTER — Ambulatory Visit (INDEPENDENT_AMBULATORY_CARE_PROVIDER_SITE_OTHER): Payer: Medicare HMO

## 2019-08-13 ENCOUNTER — Other Ambulatory Visit: Payer: Self-pay

## 2019-08-13 VITALS — Ht 64.0 in | Wt 180.0 lb

## 2019-08-13 DIAGNOSIS — M7661 Achilles tendinitis, right leg: Secondary | ICD-10-CM

## 2019-08-13 DIAGNOSIS — M25572 Pain in left ankle and joints of left foot: Secondary | ICD-10-CM

## 2019-08-13 NOTE — Progress Notes (Signed)
Office Visit Note   Patient: Patricia Hodges           Date of Birth: December 09, 1945           MRN: [id number] Visit Date: 08/13/2019              Requested by: Sharilyn Sites, [address] [place],  [place] [address] PCP: Sharilyn Sites, MD   Assessment & Plan: Visit Diagnoses:  1. Tendonitis, Achilles, right     Plan: Patient can start weaning herself out of the boot back into regular shoe.  We discussed cycling on and off several hours with her shoe and then the boot.  I will recheck her again in 6 weeks.  If she has recurrence of problems will need to consider MRI imaging.  Currently she has had improvement with the boot.  Hopefully this will continue to improve.  Follow-Up Instructions: Return in about 6 weeks (around 09/24/2019).   Orders:  No orders of the defined types were placed in this encounter.  No orders of the defined types were placed in this encounter.     Procedures: No procedures performed   Clinical Data: No additional findings.   Subjective: No chief complaint on file.   HPI patient returns post 6 weeks boot for Achilles insertional tendinopathy.  X-rays demonstrate previous metatarsal osteotomy left second toe metatarsal head and right bunionette distal fifth metatarsal.  Bilateral calcifications Achilles and insertion site, pump bumps.  She states the boot has helped.  She has had numerous cortisone injections in her feet in the past and is wondering of the time for more.  Review of Systems 14 point update unchanged patient had previous cervical fusion.   Objective: Vital Signs: There were no vitals taken for this visit.  Physical Exam Constitutional:      Appearance: She is well-developed.  HENT:     Head: Normocephalic.     Right Ear: External ear normal.     Left Ear: External ear normal.  Eyes:     Pupils: Pupils are equal, round, and reactive to light.  Neck:     Thyroid: No thyromegaly.     Trachea: No tracheal deviation.    Cardiovascular:     Rate and Rhythm: Normal rate.  Pulmonary:     Effort: Pulmonary effort is normal.  Abdominal:     Palpations: Abdomen is soft.  Skin:    General: Skin is warm and dry.  Neurological:     Mental Status: She is alert and oriented to person, place, and time.  Psychiatric:        Behavior: Behavior normal.     Ortho Exam patient has well-healed scars right left foot from previous osteotomy corresponding with images on x-ray.  She has some tenderness at the Achilles tendon insertion site in the right foot less on the left.  Minimal tenderness to the plantar fascial origin.  Subtalar motion is normal.  Specialty Comments:  No specialty comments available.  Imaging: No results found.   PMFS History: Patient Active Problem List   Diagnosis Date Noted  . Tendonitis, Achilles, right 07/13/2019  . Trochanteric bursitis, left hip 03/26/2019  . Lumbar foraminal stenosis 09/06/2016  . Surgery, elective   . History of colonic polyps   . ABDOMINAL BLOATING 11/11/2009  . HELICOBACTER PYLORI GASTRITIS 11/17/2007  . SCHATZKI'S RING 11/17/2007  . GERD 11/17/2007  . HIATAL HERNIA 11/17/2007  . EOSINOPHILIC GASTROENTERITIS 0000000  . Other spondylosis with radiculopathy, lumbar region 11/17/2007  .  WEIGHT LOSS 11/17/2007  . NAUSEA 11/17/2007  . VOMITING 11/17/2007  . HEARTBURN 11/17/2007  . OTHER DYSPHAGIA 11/17/2007  . DIARRHEA 11/17/2007  . ABDOMINAL PAIN, HX OF 11/17/2007  . CHOLECYSTECTOMY, HX OF 11/17/2007   Past Medical History:  Diagnosis Date  . Basal cell carcinoma 04/25/2017   nod- left lower back (CX35FU)  . Diverticulosis   . Dysphagia   . Esophageal reflux   . Gastroenteritis, eosinophilic   . H pylori ulcer   . High cholesterol   . Osteoarthritis   . S/P colonoscopy 2006   diverticulosis, due for repeat in 2016  . S/P endoscopy 2007   Schatzki's Ring  . SCC (squamous cell carcinoma) 07/22/2019   Left anterior neck-  . Squamous cell  carcinoma of skin 02/06/2011   in situ (CX35FU)    Family History  Problem Relation Age of Onset  . Colon cancer Neg Hx     Past Surgical History:  Procedure Laterality Date  . ANTERIOR CERVICAL DECOMP/DISCECTOMY FUSION N/A 01/30/2016   Procedure: C5-6 Anterior Cervical Discectomy and Fusion, Allograft, Plate;  Surgeon: [name], MD;  Location: [facility];  Service: Orthopedics;  Laterality: N/A;  . bladder tack    . BUNIONECTOMY Right   . CARPAL TUNNEL RELEASE Left   . CATARACT EXTRACTION W/PHACO Right 11/28/2015   Procedure: CATARACT EXTRACTION PHACO AND INTRAOCULAR LENS PLACEMENT ([place]) RIGHT;  Surgeon: Tonny Branch, MD;  [facility];  Service: Ophthalmology;  Laterality: Right;  CDE: 6.96  . CATARACT EXTRACTION W/PHACO Left 12/15/2015   Procedure: CATARACT EXTRACTION PHACO AND INTRAOCULAR LENS PLACEMENT LEFT EYE CDE=9.04;  Surgeon: Tonny Branch, MD;  [facility];  Service: Ophthalmology;  Laterality: Left;  left  . CESAREAN SECTION    . CHOLECYSTECTOMY    . COLONOSCOPY  2006  . COLONOSCOPY N/A 09/08/2014   Procedure: COLONOSCOPY;  Surgeon: Daneil Dolin, MD;  [facility];  Service: Endoscopy;  Laterality: N/A;  10:30 AM  . COLONOSCOPY N/A 11/20/2017   Procedure: COLONOSCOPY;  Surgeon: Daneil Dolin, MD;  [facility];  Service: Endoscopy;  Laterality: N/A;  10:30  . feet surgery    . HAND SURGERY Right    on ring finger due to swollen knuckle  . Hx schatski's ring    . POLYPECTOMY  11/20/2017   Procedure: POLYPECTOMY;  Surgeon: Daneil Dolin, MD;  [facility];  Service: Endoscopy;;  ascending colon (CSx1) descending colon(CSx1)  . S/P Hysterectomy     Social History   Occupational History  . Not on file  Tobacco Use  . Smoking status: Former Smoker    Packs/day: 1.00    Years: 20.00    Pack years: 20.00    Types: Cigarettes    Quit date: 11/23/1978    Years since quitting: 40.7  . Smokeless tobacco: Never Used  . Tobacco comment:  quit about 30 yrs ago  Substance and Sexual Activity  . Alcohol use: No  . Drug use: No  . Sexual activity: Not on file

## 2019-08-20 ENCOUNTER — Encounter: Payer: Self-pay | Admitting: *Deleted

## 2019-08-20 ENCOUNTER — Other Ambulatory Visit: Payer: Self-pay

## 2019-08-20 ENCOUNTER — Ambulatory Visit (INDEPENDENT_AMBULATORY_CARE_PROVIDER_SITE_OTHER): Payer: Medicare HMO | Admitting: Physician Assistant

## 2019-08-20 ENCOUNTER — Encounter: Payer: Self-pay | Admitting: Physician Assistant

## 2019-08-20 DIAGNOSIS — L570 Actinic keratosis: Secondary | ICD-10-CM

## 2019-08-20 DIAGNOSIS — D044 Carcinoma in situ of skin of scalp and neck: Secondary | ICD-10-CM

## 2019-08-20 NOTE — Progress Notes (Signed)
    Follow up Visit  Subjective  Patricia Hodges is a 74 y.o. female who presents for the following: Procedure (treatment if cis left anterior neck.).  Objective  Well appearing patient in no apparent distress; mood and affect are within normal limits.  A focused examination was performed including face and left neck. Relevant physical exam findings are noted in the Assessment and Plan.   Objective  Left Anterior Mandible: Erythematous patches with gritty scale.  Objective  Left Anterior Neck: Biopsy scar identifed CX35FU-total lesion size- 1.2 cm  Assessment & Plan  AK (actinic keratosis) per path Left Anterior Mandible  Destruction of lesion - Left Anterior Mandible Complexity: simple   Destruction method: cryotherapy   Informed consent: discussed and consent obtained   Timeout:  patient name, date of birth, surgical site, and procedure verified Lesion destroyed using liquid nitrogen: Yes   Outcome: patient tolerated procedure well with no complications   Post-procedure details: wound care instructions given    Squamous cell carcinoma in situ (SCCIS) of skin of neck Left Anterior Neck  Destruction of lesion Complexity: simple   Destruction method: electrodesiccation and curettage   Informed consent: discussed and consent obtained   Timeout:  patient name, date of birth, surgical site, and procedure verified Anesthesia: the lesion was anesthetized in a standard fashion   Anesthetic:  1% lidocaine w/ epinephrine 1-100,000 local infiltration Curettage performed in three different directions: Yes   Curettage cycles:  3 Lesion length (cm):  1.2 Lesion width (cm):  1 Margin per side (cm):  0 Final wound size (cm):  1.2 Hemostasis achieved with:  ferric subsulfate Outcome: patient tolerated procedure well with no complications   Post-procedure details: wound care instructions given   Additional details:  Wound innoculated with 5 fluorouracil solution.

## 2019-08-20 NOTE — Patient Instructions (Signed)

## 2019-09-10 ENCOUNTER — Ambulatory Visit (INDEPENDENT_AMBULATORY_CARE_PROVIDER_SITE_OTHER): Payer: Medicare HMO | Admitting: Orthopaedic Surgery

## 2019-09-10 ENCOUNTER — Other Ambulatory Visit: Payer: Self-pay

## 2019-09-10 ENCOUNTER — Encounter: Payer: Self-pay | Admitting: Orthopaedic Surgery

## 2019-09-10 VITALS — BP 118/86 | HR 74 | Ht 64.0 in | Wt 180.0 lb

## 2019-09-10 DIAGNOSIS — M7661 Achilles tendinitis, right leg: Secondary | ICD-10-CM | POA: Diagnosis not present

## 2019-09-10 NOTE — Progress Notes (Signed)
Office Visit Note   Patient: Patricia Hodges           Date of Birth: 1946-02-25           MRN: [id number] Visit Date: 09/10/2019              Requested by: Sharilyn Sites, [place],  [place] [address] PCP: Sharilyn Sites, MD   Assessment & Plan: Visit Diagnoses:  1. Tendonitis, Achilles, right     Plan: Patient can wean herself out of the boot.  We discussed resuming using the boot if she has a flare in her symptoms.  She can work on some gentle Achilles tendon stretching.  We will follow up and check her again in 3 months.  Follow-Up Instructions: Return in about 3 months (around 12/11/2019).   Orders:  No orders of the defined types were placed in this encounter.  No orders of the defined types were placed in this encounter.     Procedures: No procedures performed   Clinical Data: No additional findings.   Subjective: Chief Complaint  Patient presents with  . Right Ankle - Pain, Follow-up    HPI 74 year old female returns for follow-up of right Achilles tendinitis.  She has been using her boot and worn her shoe some.  She has had the boot off most of the day.  She states she has less discomfort when she keeps the boot off.  She is used some ibuprofen on a as needed basis.  Review of Systems updated and unchanged from last office visit other than as mentioned HPI.   Objective: Vital Signs: BP 118/86   Pulse 74   Ht 5\' 4"  (1.626 m)   Wt 180 lb (81.6 kg)   BMI 30.90 kg/m   Physical Exam Constitutional:      Appearance: She is well-developed.  HENT:     Head: Normocephalic.     Right Ear: External ear normal.     Left Ear: External ear normal.  Eyes:     Pupils: Pupils are equal, round, and reactive to light.  Neck:     Thyroid: No thyromegaly.     Trachea: No tracheal deviation.  Cardiovascular:     Rate and Rhythm: Normal rate.  Pulmonary:     Effort: Pulmonary effort is normal.  Abdominal:     Palpations: Abdomen is  soft.  Skin:    General: Skin is warm and dry.  Neurological:     Mental Status: She is alert and oriented to person, place, and time.  Psychiatric:        Behavior: Behavior normal.     Ortho Exam patient has minimal tenderness over the right Achilles tendon insertion site.  Less pronounced than on September 09, 2019 office visit.  Good subtalar motion.  Scars right and left foot from previous osteotomies.  Skin over Achilles tendon is normal.  No palpable defects in Achilles tendon and no thickening of the tendon noted by palpation.  No plantar foot lesions.  Specialty Comments:  No specialty comments available.  Imaging: No results found.   PMFS History: Patient Active Problem List   Diagnosis Date Noted  . Tendonitis, Achilles, right 07/13/2019  . Trochanteric bursitis, left hip 03/26/2019  . Lumbar foraminal stenosis 09/06/2016  . Surgery, elective   . History of colonic polyps   . ABDOMINAL BLOATING 11/11/2009  . HELICOBACTER PYLORI GASTRITIS 11/17/2007  . SCHATZKI'S RING 11/17/2007  . GERD 11/17/2007  . HIATAL HERNIA 11/17/2007  .  EOSINOPHILIC GASTROENTERITIS 81/19/1478  . Other spondylosis with radiculopathy, lumbar region 11/17/2007  . WEIGHT LOSS 11/17/2007  . NAUSEA 11/17/2007  . VOMITING 11/17/2007  . HEARTBURN 11/17/2007  . OTHER DYSPHAGIA 11/17/2007  . DIARRHEA 11/17/2007  . ABDOMINAL PAIN, HX OF 11/17/2007  . CHOLECYSTECTOMY, HX OF 11/17/2007   Past Medical History:  Diagnosis Date  . Basal cell carcinoma 04/25/2017   nod- left lower back (CX35FU)  . Diverticulosis   . Dysphagia   . Esophageal reflux   . Gastroenteritis, eosinophilic   . H pylori ulcer   . High cholesterol   . Osteoarthritis   . S/P colonoscopy 2006   diverticulosis, due for repeat in 2016  . S/P endoscopy 2007   Schatzki's Ring  . SCC (squamous cell carcinoma) 07/22/2019   Left anterior neck- (cx68fu)  . Squamous cell carcinoma of skin 02/06/2011   in situ (CX35FU)    Family  History  Problem Relation Age of Onset  . Colon cancer Neg Hx     Past Surgical History:  Procedure Laterality Date  . ANTERIOR CERVICAL DECOMP/DISCECTOMY FUSION N/A 01/30/2016   Procedure: C5-6 Anterior Cervical Discectomy and Fusion, Allograft, Plate;  Surgeon: [name], MD;  Location: [facility];  Service: Orthopedics;  Laterality: N/A;  . bladder tack    . BUNIONECTOMY Right   . CARPAL TUNNEL RELEASE Left   . CATARACT EXTRACTION W/PHACO Right 11/28/2015   Procedure: CATARACT EXTRACTION PHACO AND INTRAOCULAR LENS PLACEMENT ([place]) RIGHT;  Surgeon: Tonny Branch, MD;  [facility];  Service: Ophthalmology;  Laterality: Right;  CDE: 6.96  . CATARACT EXTRACTION W/PHACO Left 12/15/2015   Procedure: CATARACT EXTRACTION PHACO AND INTRAOCULAR LENS PLACEMENT LEFT EYE CDE=9.04;  Surgeon: Tonny Branch, MD;  [facility];  Service: Ophthalmology;  Laterality: Left;  left  . CESAREAN SECTION    . CHOLECYSTECTOMY    . COLONOSCOPY  2006  . COLONOSCOPY N/A 09/08/2014   Procedure: COLONOSCOPY;  Surgeon: Daneil Dolin, MD;  [facility];  Service: Endoscopy;  Laterality: N/A;  10:30 AM  . COLONOSCOPY N/A 11/20/2017   Procedure: COLONOSCOPY;  Surgeon: Daneil Dolin, MD;  [facility];  Service: Endoscopy;  Laterality: N/A;  10:30  . feet surgery    . HAND SURGERY Right    on ring finger due to swollen knuckle  . Hx schatski's ring    . POLYPECTOMY  11/20/2017   Procedure: POLYPECTOMY;  Surgeon: Daneil Dolin, MD;  [facility];  Service: Endoscopy;;  ascending colon (CSx1) descending colon(CSx1)  . S/P Hysterectomy     Social History   Occupational History  . Not on file  Tobacco Use  . Smoking status: Former Smoker    Packs/day: 1.00    Years: 20.00    Pack years: 20.00    Types: Cigarettes    Quit date: 11/23/1978    Years since quitting: 40.8  . Smokeless tobacco: Never Used  . Tobacco comment: quit about 30 yrs ago  Vaping Use  . Vaping Use: Never used    Substance and Sexual Activity  . Alcohol use: No  . Drug use: No  . Sexual activity: Not on file

## 2019-09-16 DIAGNOSIS — E7849 Other hyperlipidemia: Secondary | ICD-10-CM | POA: Diagnosis not present

## 2019-09-16 DIAGNOSIS — K219 Gastro-esophageal reflux disease without esophagitis: Secondary | ICD-10-CM | POA: Diagnosis not present

## 2019-09-16 DIAGNOSIS — M810 Age-related osteoporosis without current pathological fracture: Secondary | ICD-10-CM | POA: Diagnosis not present

## 2019-09-16 DIAGNOSIS — R69 Illness, unspecified: Secondary | ICD-10-CM | POA: Diagnosis not present

## 2019-11-12 ENCOUNTER — Telehealth: Payer: Self-pay | Admitting: Radiology

## 2019-11-12 NOTE — Telephone Encounter (Signed)
Note entered. Copy left at front desk at [facility] office for pick up if she would like to get it there.

## 2019-11-12 NOTE — Telephone Encounter (Signed)
Yes OK times 3 months thanks

## 2019-11-12 NOTE — Telephone Encounter (Signed)
Patient requests note to be excused from jury duty due to her feet and legs.  She is scheduled to report 12/15/2019. Please advise.    CB for patient is [phone]

## 2019-11-25 ENCOUNTER — Ambulatory Visit: Payer: Medicare HMO | Admitting: Physician Assistant

## 2019-12-10 ENCOUNTER — Ambulatory Visit (INDEPENDENT_AMBULATORY_CARE_PROVIDER_SITE_OTHER): Payer: Medicare HMO | Admitting: Orthopaedic Surgery

## 2019-12-10 ENCOUNTER — Encounter: Payer: Self-pay | Admitting: Orthopaedic Surgery

## 2019-12-10 VITALS — BP 145/90 | HR 72 | Ht 64.0 in | Wt 180.0 lb

## 2019-12-10 DIAGNOSIS — M7661 Achilles tendinitis, right leg: Secondary | ICD-10-CM | POA: Diagnosis not present

## 2019-12-10 NOTE — Progress Notes (Signed)
Office Visit Note   Patient: Patricia Hodges           Date of Birth: September 24, 1945           MRN: [id number] Visit Date: 12/10/2019              Requested by: Sharilyn Sites, [address] [place],  [place] [address] PCP: Sharilyn Sites, MD   Assessment & Plan: Visit Diagnoses:  1. Tendonitis, Achilles, right     Plan: Insertional tendinopathy Achilles tendon.  She is gotten improvement with the mobilization and stretching.  She can continue to use the boot intermittently until her symptoms completely resolve and office follow-up as needed.  Follow-Up Instructions: No follow-ups on file.   Orders:  No orders of the defined types were placed in this encounter.  No orders of the defined types were placed in this encounter.     Procedures: No procedures performed   Clinical Data: No additional findings.   Subjective: Chief Complaint  Patient presents with  . Right Ankle - Follow-up  . Left Ankle - Follow-up    HPI 74 year old female follow-up Achilles tendinopathy right worse than left.  She has been using the cam boot off and on for several months states only last few weeks she noticed that she is gotten great relief.  Today she is ambulating without a limp.  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  Patient states if she walks a lot in the last few months she is noticed some recurrence of symptoms.  Review of Systems 14 point system update unchanged from previous office visit other than as mentioned HPI.   Objective: Vital Signs: BP (!) 145/90   Pulse 72   Ht 5\' 4"  (1.626 m)   Wt 180 lb (81.6 kg)   BMI 30.90 kg/m   Physical Exam Constitutional:      Appearance: She is well-developed.  HENT:     Head: Normocephalic.     Right Ear: External ear normal.     Left Ear: External ear normal.  Eyes:     Pupils: Pupils are equal, round, and reactive to light.  Neck:     Thyroid: No  thyromegaly.     Trachea: No tracheal deviation.  Cardiovascular:     Rate and Rhythm: Normal rate.  Pulmonary:     Effort: Pulmonary effort is normal.  Abdominal:     Palpations: Abdomen is soft.  Skin:    General: Skin is warm and dry.  Neurological:     Mental Status: She is alert and oriented to person, place, and time.  Psychiatric:        Behavior: Behavior normal.     Ortho Exam patient is full ankle range of motion amatory with normal heel toe gait.  She still has slight tenderness at the Achilles tendon insertion site in the right calcaneus minimal on the left.  Some prominence of the calcific tendinopathy.  Specialty Comments:  No specialty comments available.  Imaging: No results found.   PMFS History: Patient Active Problem List   Diagnosis Date Noted  . Tendonitis, Achilles, right 07/13/2019  . Trochanteric bursitis, left hip 03/26/2019  . Lumbar foraminal stenosis 09/06/2016  . Surgery, elective   . History of colonic polyps   . ABDOMINAL BLOATING 11/11/2009  . HELICOBACTER PYLORI GASTRITIS 11/17/2007  . SCHATZKI'S RING 11/17/2007  . GERD 11/17/2007  . HIATAL HERNIA 11/17/2007  .  EOSINOPHILIC GASTROENTERITIS 82/42/3536  . Other spondylosis with radiculopathy, lumbar region 11/17/2007  . WEIGHT LOSS 11/17/2007  . NAUSEA 11/17/2007  . VOMITING 11/17/2007  . HEARTBURN 11/17/2007  . OTHER DYSPHAGIA 11/17/2007  . DIARRHEA 11/17/2007  . ABDOMINAL PAIN, HX OF 11/17/2007  . CHOLECYSTECTOMY, HX OF 11/17/2007   Past Medical History:  Diagnosis Date  . Basal cell carcinoma 04/25/2017   nod- left lower back (CX35FU)  . Diverticulosis   . Dysphagia   . Esophageal reflux   . Gastroenteritis, eosinophilic   . H pylori ulcer   . High cholesterol   . Osteoarthritis   . S/P colonoscopy 2006   diverticulosis, due for repeat in 2016  . S/P endoscopy 2007   Schatzki's Ring  . SCC (squamous cell carcinoma) 07/22/2019   Left anterior neck- (cx27fu)  .  Squamous cell carcinoma of skin 02/06/2011   in situ (CX35FU)    Family History  Problem Relation Age of Onset  . Colon cancer Neg Hx     Past Surgical History:  Procedure Laterality Date  . ANTERIOR CERVICAL DECOMP/DISCECTOMY FUSION N/A 01/30/2016   Procedure: C5-6 Anterior Cervical Discectomy and Fusion, Allograft, Plate;  Surgeon: [name], MD;  Location: [facility];  Service: Orthopedics;  Laterality: N/A;  . bladder tack    . BUNIONECTOMY Right   . CARPAL TUNNEL RELEASE Left   . CATARACT EXTRACTION W/PHACO Right 11/28/2015   Procedure: CATARACT EXTRACTION PHACO AND INTRAOCULAR LENS PLACEMENT ([place]) RIGHT;  Surgeon: Tonny Branch, MD;  [facility];  Service: Ophthalmology;  Laterality: Right;  CDE: 6.96  . CATARACT EXTRACTION W/PHACO Left 12/15/2015   Procedure: CATARACT EXTRACTION PHACO AND INTRAOCULAR LENS PLACEMENT LEFT EYE CDE=9.04;  Surgeon: Tonny Branch, MD;  Location: [facility];  Service: Ophthalmology;  Laterality: Left;  left  . CESAREAN SECTION    . CHOLECYSTECTOMY    . COLONOSCOPY  2006  . COLONOSCOPY N/A 09/08/2014   Procedure: COLONOSCOPY;  Surgeon: Daneil Dolin, MD;  [facility];  Service: Endoscopy;  Laterality: N/A;  10:30 AM  . COLONOSCOPY N/A 11/20/2017   Procedure: COLONOSCOPY;  Surgeon: Daneil Dolin, MD;  [facility];  Service: Endoscopy;  Laterality: N/A;  10:30  . feet surgery    . HAND SURGERY Right    on ring finger due to swollen knuckle  . Hx schatski's ring    . POLYPECTOMY  11/20/2017   Procedure: POLYPECTOMY;  Surgeon: Daneil Dolin, MD;  [facility];  Service: Endoscopy;;  ascending colon (CSx1) descending colon(CSx1)  . S/P Hysterectomy     Social History   Occupational History  . Not on file  Tobacco Use  . Smoking status: Former Smoker    Packs/day: 1.00    Years: 20.00    Pack years: 20.00    Types: Cigarettes    Quit date: 11/23/1978    Years since quitting: 41.0  . Smokeless tobacco: Never Used  .  Tobacco comment: quit about 30 yrs ago  Vaping Use  . Vaping Use: Never used  Substance and Sexual Activity  . Alcohol use: No  . Drug use: No  . Sexual activity: Not on file

## 2019-12-17 DIAGNOSIS — G894 Chronic pain syndrome: Secondary | ICD-10-CM | POA: Diagnosis not present

## 2019-12-17 DIAGNOSIS — E6609 Other obesity due to excess calories: Secondary | ICD-10-CM | POA: Diagnosis not present

## 2019-12-17 DIAGNOSIS — K219 Gastro-esophageal reflux disease without esophagitis: Secondary | ICD-10-CM | POA: Diagnosis not present

## 2019-12-17 DIAGNOSIS — M810 Age-related osteoporosis without current pathological fracture: Secondary | ICD-10-CM | POA: Diagnosis not present

## 2019-12-21 ENCOUNTER — Other Ambulatory Visit (HOSPITAL_COMMUNITY): Payer: Self-pay | Admitting: Family Medicine

## 2019-12-21 DIAGNOSIS — Z1231 Encounter for screening mammogram for malignant neoplasm of breast: Secondary | ICD-10-CM

## 2020-01-04 ENCOUNTER — Ambulatory Visit (HOSPITAL_COMMUNITY): Payer: Medicare HMO

## 2020-01-07 ENCOUNTER — Ambulatory Visit (HOSPITAL_COMMUNITY): Payer: Medicare HMO

## 2020-01-16 DIAGNOSIS — M810 Age-related osteoporosis without current pathological fracture: Secondary | ICD-10-CM | POA: Diagnosis not present

## 2020-01-16 DIAGNOSIS — G894 Chronic pain syndrome: Secondary | ICD-10-CM | POA: Diagnosis not present

## 2020-01-16 DIAGNOSIS — K219 Gastro-esophageal reflux disease without esophagitis: Secondary | ICD-10-CM | POA: Diagnosis not present

## 2020-01-16 DIAGNOSIS — E6609 Other obesity due to excess calories: Secondary | ICD-10-CM | POA: Diagnosis not present

## 2020-01-28 ENCOUNTER — Ambulatory Visit (HOSPITAL_COMMUNITY): Payer: Medicare HMO

## 2020-03-17 DIAGNOSIS — R69 Illness, unspecified: Secondary | ICD-10-CM | POA: Diagnosis not present

## 2020-03-31 ENCOUNTER — Ambulatory Visit: Payer: Medicare HMO | Admitting: Orthopaedic Surgery

## 2020-03-31 ENCOUNTER — Other Ambulatory Visit: Payer: Self-pay

## 2020-04-06 ENCOUNTER — Ambulatory Visit (HOSPITAL_COMMUNITY): Payer: Medicare HMO

## 2020-04-15 ENCOUNTER — Other Ambulatory Visit: Payer: Self-pay

## 2020-04-15 ENCOUNTER — Ambulatory Visit (HOSPITAL_COMMUNITY)
Admission: RE | Admit: 2020-04-15 | Discharge: 2020-04-15 | Disposition: A | Discharge status: Home / Self Care | Payer: Medicare HMO | Source: Ambulatory Visit | Attending: Family Medicine | Admitting: Family Medicine

## 2020-04-15 ENCOUNTER — Ambulatory Visit (HOSPITAL_COMMUNITY): Payer: Medicare HMO

## 2020-04-15 DIAGNOSIS — M898X7 Other specified disorders of bone, ankle and foot: Secondary | ICD-10-CM | POA: Diagnosis not present

## 2020-04-15 DIAGNOSIS — Z1231 Encounter for screening mammogram for malignant neoplasm of breast: Secondary | ICD-10-CM | POA: Diagnosis not present

## 2020-04-15 DIAGNOSIS — M79671 Pain in right foot: Secondary | ICD-10-CM | POA: Diagnosis not present

## 2020-04-15 DIAGNOSIS — M7661 Achilles tendinitis, right leg: Secondary | ICD-10-CM | POA: Diagnosis not present

## 2020-04-15 DIAGNOSIS — M79672 Pain in left foot: Secondary | ICD-10-CM | POA: Diagnosis not present

## 2020-04-16 DIAGNOSIS — E785 Hyperlipidemia, unspecified: Secondary | ICD-10-CM | POA: Diagnosis not present

## 2020-04-16 DIAGNOSIS — M810 Age-related osteoporosis without current pathological fracture: Secondary | ICD-10-CM | POA: Diagnosis not present

## 2020-04-16 DIAGNOSIS — E6609 Other obesity due to excess calories: Secondary | ICD-10-CM | POA: Diagnosis not present

## 2020-04-16 DIAGNOSIS — K219 Gastro-esophageal reflux disease without esophagitis: Secondary | ICD-10-CM | POA: Diagnosis not present

## 2020-04-19 DIAGNOSIS — H5203 Hypermetropia, bilateral: Secondary | ICD-10-CM | POA: Diagnosis not present

## 2020-05-06 DIAGNOSIS — M79671 Pain in right foot: Secondary | ICD-10-CM | POA: Diagnosis not present

## 2020-05-06 DIAGNOSIS — M7661 Achilles tendinitis, right leg: Secondary | ICD-10-CM | POA: Diagnosis not present

## 2020-05-06 DIAGNOSIS — M79672 Pain in left foot: Secondary | ICD-10-CM | POA: Diagnosis not present

## 2020-05-06 DIAGNOSIS — M898X7 Other specified disorders of bone, ankle and foot: Secondary | ICD-10-CM | POA: Diagnosis not present

## 2020-05-17 DIAGNOSIS — Z1389 Encounter for screening for other disorder: Secondary | ICD-10-CM | POA: Diagnosis not present

## 2020-05-17 DIAGNOSIS — Z1331 Encounter for screening for depression: Secondary | ICD-10-CM | POA: Diagnosis not present

## 2020-05-17 DIAGNOSIS — E7849 Other hyperlipidemia: Secondary | ICD-10-CM | POA: Diagnosis not present

## 2020-05-17 DIAGNOSIS — E6609 Other obesity due to excess calories: Secondary | ICD-10-CM | POA: Diagnosis not present

## 2020-05-17 DIAGNOSIS — Z6830 Body mass index (BMI) 30.0-30.9, adult: Secondary | ICD-10-CM | POA: Diagnosis not present

## 2020-05-17 DIAGNOSIS — E782 Mixed hyperlipidemia: Secondary | ICD-10-CM | POA: Diagnosis not present

## 2020-05-17 DIAGNOSIS — Z0000 Encounter for general adult medical examination without abnormal findings: Secondary | ICD-10-CM | POA: Diagnosis not present

## 2020-06-03 DIAGNOSIS — M898X7 Other specified disorders of bone, ankle and foot: Secondary | ICD-10-CM | POA: Diagnosis not present

## 2020-06-03 DIAGNOSIS — M79672 Pain in left foot: Secondary | ICD-10-CM | POA: Diagnosis not present

## 2020-06-03 DIAGNOSIS — M7661 Achilles tendinitis, right leg: Secondary | ICD-10-CM | POA: Diagnosis not present

## 2020-06-03 DIAGNOSIS — M79671 Pain in right foot: Secondary | ICD-10-CM | POA: Diagnosis not present

## 2020-06-07 DIAGNOSIS — L570 Actinic keratosis: Secondary | ICD-10-CM | POA: Diagnosis not present

## 2020-06-07 DIAGNOSIS — D485 Neoplasm of uncertain behavior of skin: Secondary | ICD-10-CM | POA: Diagnosis not present

## 2020-06-07 DIAGNOSIS — L821 Other seborrheic keratosis: Secondary | ICD-10-CM | POA: Diagnosis not present

## 2020-06-10 ENCOUNTER — Ambulatory Visit: Payer: Medicare HMO | Admitting: Internal Medicine

## 2020-07-16 DIAGNOSIS — K219 Gastro-esophageal reflux disease without esophagitis: Secondary | ICD-10-CM | POA: Diagnosis not present

## 2020-07-16 DIAGNOSIS — E785 Hyperlipidemia, unspecified: Secondary | ICD-10-CM | POA: Diagnosis not present

## 2020-07-16 DIAGNOSIS — M810 Age-related osteoporosis without current pathological fracture: Secondary | ICD-10-CM | POA: Diagnosis not present

## 2020-09-09 DIAGNOSIS — M545 Low back pain, unspecified: Secondary | ICD-10-CM | POA: Diagnosis not present

## 2020-09-09 DIAGNOSIS — H9313 Tinnitus, bilateral: Secondary | ICD-10-CM | POA: Diagnosis not present

## 2020-09-09 DIAGNOSIS — Z6830 Body mass index (BMI) 30.0-30.9, adult: Secondary | ICD-10-CM | POA: Diagnosis not present

## 2020-09-09 DIAGNOSIS — E6609 Other obesity due to excess calories: Secondary | ICD-10-CM | POA: Diagnosis not present

## 2020-09-13 ENCOUNTER — Other Ambulatory Visit (HOSPITAL_COMMUNITY): Payer: Self-pay | Admitting: Family Medicine

## 2020-09-13 DIAGNOSIS — E2839 Other primary ovarian failure: Secondary | ICD-10-CM

## 2020-09-26 ENCOUNTER — Other Ambulatory Visit: Payer: Self-pay

## 2020-09-26 ENCOUNTER — Ambulatory Visit (HOSPITAL_COMMUNITY)
Admission: RE | Admit: 2020-09-26 | Discharge: 2020-09-26 | Disposition: A | Discharge status: Home / Self Care | Payer: Medicare HMO | Source: Ambulatory Visit | Attending: Family Medicine | Admitting: Family Medicine

## 2020-09-26 DIAGNOSIS — M810 Age-related osteoporosis without current pathological fracture: Secondary | ICD-10-CM | POA: Diagnosis not present

## 2020-09-26 DIAGNOSIS — E2839 Other primary ovarian failure: Secondary | ICD-10-CM

## 2020-10-11 DIAGNOSIS — H9113 Presbycusis, bilateral: Secondary | ICD-10-CM | POA: Insufficient documentation

## 2020-10-11 DIAGNOSIS — H90A22 Sensorineural hearing loss, unilateral, left ear, with restricted hearing on the contralateral side: Secondary | ICD-10-CM | POA: Diagnosis not present

## 2020-10-11 DIAGNOSIS — H90A31 Mixed conductive and sensorineural hearing loss, unilateral, right ear with restricted hearing on the contralateral side: Secondary | ICD-10-CM | POA: Diagnosis not present

## 2020-12-12 DIAGNOSIS — L570 Actinic keratosis: Secondary | ICD-10-CM | POA: Diagnosis not present

## 2021-01-09 DIAGNOSIS — Z23 Encounter for immunization: Secondary | ICD-10-CM | POA: Diagnosis not present

## 2021-01-26 DIAGNOSIS — J0100 Acute maxillary sinusitis, unspecified: Secondary | ICD-10-CM | POA: Diagnosis not present

## 2021-01-26 DIAGNOSIS — E6609 Other obesity due to excess calories: Secondary | ICD-10-CM | POA: Diagnosis not present

## 2021-01-26 DIAGNOSIS — Z6830 Body mass index (BMI) 30.0-30.9, adult: Secondary | ICD-10-CM | POA: Diagnosis not present

## 2021-01-26 DIAGNOSIS — M1991 Primary osteoarthritis, unspecified site: Secondary | ICD-10-CM | POA: Diagnosis not present

## 2021-01-26 DIAGNOSIS — G894 Chronic pain syndrome: Secondary | ICD-10-CM | POA: Diagnosis not present

## 2021-02-23 ENCOUNTER — Ambulatory Visit (INDEPENDENT_AMBULATORY_CARE_PROVIDER_SITE_OTHER): Payer: Medicare HMO

## 2021-02-23 ENCOUNTER — Encounter: Payer: Self-pay | Admitting: Orthopaedic Surgery

## 2021-02-23 ENCOUNTER — Other Ambulatory Visit: Payer: Self-pay

## 2021-02-23 ENCOUNTER — Ambulatory Visit (INDEPENDENT_AMBULATORY_CARE_PROVIDER_SITE_OTHER): Payer: Medicare HMO | Admitting: Orthopaedic Surgery

## 2021-02-23 VITALS — Ht 64.0 in | Wt 182.0 lb

## 2021-02-23 DIAGNOSIS — M549 Dorsalgia, unspecified: Secondary | ICD-10-CM | POA: Diagnosis not present

## 2021-02-23 DIAGNOSIS — M542 Cervicalgia: Secondary | ICD-10-CM

## 2021-02-27 NOTE — Progress Notes (Signed)
Office Visit Note   Patient: Patricia Hodges           Date of Birth: December 16, 1945           MRN: [id number] Visit Date: 02/23/2021              Requested by: Sharilyn Sites, [address] [place],  [place] [address] PCP: Sharilyn Sites, MD   Assessment & Plan: Visit Diagnoses:  1. Neck pain   2. Mid back pain     Plan: We will set patient up for some physical therapy she can call when she is ready to start she states she has some changes in insurance coming up possibly.  She can follow-up after therapy if she is having persistent problems.  X-ray results were reviewed.  Follow-Up Instructions: No follow-ups on file.   Orders:  Orders Placed This Encounter  Procedures   XR Cervical Spine 2 or 3 views   XR Thoracic Spine 2 View   No orders of the defined types were placed in this encounter.     Procedures: No procedures performed   Clinical Data: No additional findings.   Subjective: Chief Complaint  Patient presents with   Neck - Pain   Middle Back - Pain    HPI 74 year old female seen with back pain.  She states pains in the middle of her back between her shoulder blades present for 1 month.  She is also had some associated neck pain.  Previous C5-6 cervical fusion 2017.  Pain sometimes radiates around to her chest.  No increased pain with exertion.  She has some increased pain if she coughs.  She has used Aleve also oxycodone states pain is 5 or 6 out of 10.  She has not been through any physical therapy.  Review of Systems all other systems updated noncontributory.   Objective: Vital Signs: Ht 5\' 4"  (1.626 m)   Wt 182 lb (82.6 kg)   BMI 31.24 kg/m   Physical Exam Constitutional:      Appearance: She is well-developed.  HENT:     Head: Normocephalic.     Right Ear: External ear normal.     Left Ear: External ear normal. There is no impacted cerumen.  Eyes:     Pupils: Pupils are equal, round, and reactive to light.  Neck:     Thyroid: No  thyromegaly.     Trachea: No tracheal deviation.  Cardiovascular:     Rate and Rhythm: Normal rate.  Pulmonary:     Effort: Pulmonary effort is normal.  Abdominal:     Palpations: Abdomen is soft.  Musculoskeletal:     Cervical back: No rigidity.  Skin:    General: Skin is warm and dry.  Neurological:     Mental Status: She is alert and oriented to person, place, and time.  Psychiatric:        Behavior: Behavior normal.    Ortho Exam well-healed cervical incision.  Some brachial plexus tenderness mild increased pain with compression no winging of the scapula upper extremity reflexes are 2+ and symmetrical.  Specialty Comments:  No specialty comments available.  Imaging: No results found.   PMFS History: Patient Active Problem List   Diagnosis Date Noted   Tendonitis, Achilles, right 07/13/2019   Trochanteric bursitis, left hip 03/26/2019   Lumbar foraminal stenosis 09/06/2016   Surgery, elective    History of colonic polyps    ABDOMINAL BLOATING 10/30/4816   HELICOBACTER PYLORI GASTRITIS 11/17/2007  SCHATZKI'S RING 11/17/2007   GERD 11/17/2007   HIATAL HERNIA 38/45/3646   EOSINOPHILIC GASTROENTERITIS 80/32/1224   Other spondylosis with radiculopathy, lumbar region 11/17/2007   WEIGHT LOSS 11/17/2007   NAUSEA 11/17/2007   VOMITING 11/17/2007   HEARTBURN 11/17/2007   OTHER DYSPHAGIA 11/17/2007   DIARRHEA 11/17/2007   ABDOMINAL PAIN, HX OF 11/17/2007   CHOLECYSTECTOMY, HX OF 11/17/2007   Past Medical History:  Diagnosis Date   Basal cell carcinoma 04/25/2017   nod- left lower back (CX35FU)   Diverticulosis    Dysphagia    Esophageal reflux    Gastroenteritis, eosinophilic    H pylori ulcer    High cholesterol    Osteoarthritis    S/P colonoscopy 2006   diverticulosis, due for repeat in 2016   S/P endoscopy 2007   Schatzki's Ring   SCC (squamous cell carcinoma) 07/22/2019   Left anterior neck- (cx64fu)   Squamous cell carcinoma of skin 02/06/2011   in  situ (CX35FU)    Family History  Problem Relation Age of Onset   Colon cancer Neg Hx     Past Surgical History:  Procedure Laterality Date   ANTERIOR CERVICAL DECOMP/DISCECTOMY FUSION N/A 01/30/2016   Procedure: C5-6 Anterior Cervical Discectomy and Fusion, Allograft, Plate;  Surgeon: [name], MD;  Location: [facility];  Service: Orthopedics;  Laterality: N/A;   bladder tack     BUNIONECTOMY Right    CARPAL TUNNEL RELEASE Left    CATARACT EXTRACTION W/PHACO Right 11/28/2015   Procedure: CATARACT EXTRACTION PHACO AND INTRAOCULAR LENS PLACEMENT ([facility]) RIGHT;  Surgeon: Tonny Branch, MD;  [facility];  Service: Ophthalmology;  Laterality: Right;  CDE: 6.96   CATARACT EXTRACTION W/PHACO Left 12/15/2015   Procedure: CATARACT EXTRACTION PHACO AND INTRAOCULAR LENS PLACEMENT LEFT EYE CDE=9.04;  Surgeon: Tonny Branch, MD;  [facility]: [facility];  Service: Ophthalmology;  Laterality: Left;  left   CESAREAN SECTION     CHOLECYSTECTOMY     COLONOSCOPY  2006   COLONOSCOPY N/A 09/08/2014   Procedure: COLONOSCOPY;  Surgeon: Daneil Dolin, MD;  [facility];  Service: Endoscopy;  Laterality: N/A;  10:30 AM   COLONOSCOPY N/A 11/20/2017   Procedure: COLONOSCOPY;  Surgeon: Daneil Dolin, MD;  [facility];  Service: Endoscopy;  Laterality: N/A;  10:30   feet surgery     HAND SURGERY Right    on ring finger due to swollen knuckle   Hx schatski's ring     POLYPECTOMY  11/20/2017   Procedure: POLYPECTOMY;  Surgeon: Daneil Dolin, MD;  [facility];  Service: Endoscopy;;  ascending colon (CSx1) descending colon(CSx1)   S/P Hysterectomy     Social History   Occupational History   Not on file  Tobacco Use   Smoking status: Former    Packs/day: 1.00    Years: 20.00    Pack years: 20.00    Types: Cigarettes    Quit date: 11/23/1978    Years since quitting: 42.2   Smokeless tobacco: Never   Tobacco comments:    quit about 30 yrs ago  Vaping Use   Vaping Use: Never used   Substance and Sexual Activity   Alcohol use: No   Drug use: No   Sexual activity: Not on file

## 2021-03-23 ENCOUNTER — Telehealth: Payer: Self-pay | Admitting: Radiology

## 2021-03-23 DIAGNOSIS — M549 Dorsalgia, unspecified: Secondary | ICD-10-CM

## 2021-03-23 DIAGNOSIS — M542 Cervicalgia: Secondary | ICD-10-CM

## 2021-03-23 DIAGNOSIS — M545 Low back pain, unspecified: Secondary | ICD-10-CM

## 2021-03-23 NOTE — Telephone Encounter (Signed)
FYI  Message from [place] office below. Per last office note, patient to call us when she wants to start PT. Referral entered.    [place], Carlon Karie Fetch, [place], RT Pt is ready to start PT at [facility] now.  LBP and between shoulder blades  [phone]

## 2021-04-06 ENCOUNTER — Other Ambulatory Visit: Payer: Self-pay

## 2021-04-06 ENCOUNTER — Ambulatory Visit (HOSPITAL_COMMUNITY): Payer: Medicare HMO | Attending: Orthopaedic Surgery | Admitting: Physical Therapy

## 2021-04-06 ENCOUNTER — Encounter (HOSPITAL_COMMUNITY): Payer: Self-pay | Admitting: Physical Therapy

## 2021-04-06 DIAGNOSIS — M546 Pain in thoracic spine: Secondary | ICD-10-CM

## 2021-04-06 DIAGNOSIS — M545 Low back pain, unspecified: Secondary | ICD-10-CM | POA: Diagnosis not present

## 2021-04-06 DIAGNOSIS — G8929 Other chronic pain: Secondary | ICD-10-CM | POA: Insufficient documentation

## 2021-04-06 DIAGNOSIS — M542 Cervicalgia: Secondary | ICD-10-CM | POA: Diagnosis not present

## 2021-04-06 DIAGNOSIS — M549 Dorsalgia, unspecified: Secondary | ICD-10-CM | POA: Insufficient documentation

## 2021-04-06 DIAGNOSIS — M5416 Radiculopathy, lumbar region: Secondary | ICD-10-CM

## 2021-04-06 NOTE — Therapy (Signed)
[place], [place], [address] Phone: [phone]   Fax:  [phone]  Physical Therapy Evaluation  Patient Details  Name: Patricia Hodges MRN: [id number] Date of Birth: 1945/03/26 Referring Provider (PT): Rodell Perna   Encounter Date: 04/06/2021   PT End of Session - 04/06/21 0956     Visit Number 1    Number of Visits 4    Date for PT Re-Evaluation 05/06/21    Authorization Type [employer] MCR    Progress Note Due on Visit 4    PT Start Time 0920    PT Stop Time 1000    PT Time Calculation (min) 40 min    Activity Tolerance Patient tolerated treatment well    Behavior During Therapy [facility] for tasks assessed/performed             Past Medical History:  Diagnosis Date   Basal cell carcinoma 04/25/2017   nod- left lower back (CX35FU)   Diverticulosis    Dysphagia    Esophageal reflux    Gastroenteritis, eosinophilic    H pylori ulcer    High cholesterol    Osteoarthritis    S/P colonoscopy 2006   diverticulosis, due for repeat in 2016   S/P endoscopy 2007   Schatzki's Ring   SCC (squamous cell carcinoma) 07/22/2019   Left anterior neck- (cx52fu)   Squamous cell carcinoma of skin 02/06/2011   in situ (CX35FU)    Past Surgical History:  Procedure Laterality Date   ANTERIOR CERVICAL DECOMP/DISCECTOMY FUSION N/A 01/30/2016   Procedure: C5-6 Anterior Cervical Discectomy and Fusion, Allograft, Plate;  Surgeon: [name], MD;  Location: [facility];  Service: Orthopedics;  Laterality: N/A;   bladder tack     BUNIONECTOMY Right    CARPAL TUNNEL RELEASE Left    CATARACT EXTRACTION W/PHACO Right 11/28/2015   Procedure: CATARACT EXTRACTION PHACO AND INTRAOCULAR LENS PLACEMENT ([facility]) RIGHT;  Surgeon: Tonny Branch, MD;  [facility];  Service: Ophthalmology;  Laterality: Right;  CDE: 6.96   CATARACT EXTRACTION W/PHACO Left 12/15/2015   Procedure: CATARACT EXTRACTION PHACO AND INTRAOCULAR LENS PLACEMENT LEFT  EYE CDE=9.04;  Surgeon: Tonny Branch, MD;  [facility];  Service: Ophthalmology;  Laterality: Left;  left   CESAREAN SECTION     CHOLECYSTECTOMY     COLONOSCOPY  2006   COLONOSCOPY N/A 09/08/2014   Procedure: COLONOSCOPY;  Surgeon: Daneil Dolin, MD;  [facility];  Service: Endoscopy;  Laterality: N/A;  10:30 AM   COLONOSCOPY N/A 11/20/2017   Procedure: COLONOSCOPY;  Surgeon: Daneil Dolin, MD;  [facility];  Service: Endoscopy;  Laterality: N/A;  10:30   feet surgery     HAND SURGERY Right    on ring finger due to swollen knuckle   Hx schatski's ring     POLYPECTOMY  11/20/2017   Procedure: POLYPECTOMY;  Surgeon: Daneil Dolin, MD;  [facility];  Service: Endoscopy;;  ascending colon (CSx1) descending colon(CSx1)   S/P Hysterectomy      There were no vitals filed for this visit.    Subjective Assessment - 04/06/21 0925     Subjective Patricia Hodges has had a history of cervical fusion, she has had low back pain for years.  She went to the MD due to having a stabbing pain between her shoulder blades which goes to her breast bone.  She states that this occurs several times a day it is more  like a catch that takes a few minutes to go away.  She has been referred to skilled PT.    Pertinent History cervical fusion, LBP, OA, basal cell carcinoma, feet problems    How long can you sit comfortably? can sit in her recliner    How long can you stand comfortably? able to stand but it kills her to wash dishes ; pain starts after about 3-4 minuts    How long can you walk comfortably? can walk but it wears her out and causes pain in feet, back and neck    Patient Stated Goals less pain    Currently in Pain? Yes    Pain Score 5     Pain Location Back    Pain Orientation Lower    Pain Descriptors / Indicators Aching    Pain Radiating Towards buttock    Pain Onset More than a month ago    Pain Frequency Constant    Aggravating Factors  standing    Pain  Relieving Factors meds    Effect of Pain on Daily Activities limits    Multiple Pain Sites --   not having any neck or chest pain at this moment this comes and goes.               [facility] PT Assessment - 04/06/21 0001       Assessment   Medical Diagnosis cervical/thoracic and lumbar pain    Referring Provider (PT) Rodell Perna    Onset Date/Surgical Date --   lumbar and cervical chronic, years,  thoracic 3 months   Next MD Visit not returning    Prior Therapy no      Precautions   Precautions --   past cervical fusion     Restrictions   Weight Bearing Restrictions No      Balance Screen   Has the patient fallen in the past 6 months No    Has the patient had a decrease in activity level because of a fear of falling?  No    Is the patient reluctant to leave their home because of a fear of falling?  No      Home Ecologist residence      Prior Function   Level of Independence Independent with household mobility without device    Vocation Retired    Leisure walking      Cognition   Overall Cognitive Status Within Functional Limits for tasks assessed      Observation/Other Assessments   Focus on Therapeutic Outcomes (FOTO)  49 ; 51 % affected      ROM / Strength   AROM / PROM / Strength AROM;Strength      AROM   AROM Assessment Site Cervical;Lumbar    Cervical Flexion 35   reps no change   Cervical Extension 43   reps no change   Cervical - Right Rotation 50    Cervical - Left Rotation 60    Lumbar Flexion uses hip motion but able to touch her toes    Lumbar Extension 18   reps no change     Strength   Strength Assessment Site Cervical;Hip;Knee    Right/Left Hip Right;Left    Right Hip Flexion 4/5    Right Hip Extension 3-/5    Right Hip ABduction 4/5    Left Hip Flexion 5/5    Left Hip Extension 2+/5    Left Hip ABduction 4/5    Right/Left Knee Right;Left  Right Knee Flexion 4+/5    Left Knee Flexion 5/5    Cervical  Extension 4/5    Cervical - Right Side Bend 4/5    Cervical - Left Side Bend 4/5                        Objective measurements completed on examination: See above findings.       [facility] Adult PT Treatment/Exercise - 04/06/21 0001       Exercises   Exercises Lumbar;Neck;Other Exercises      Neck Exercises: Seated   W Back 10 reps    Other Seated Exercise scapular retraction x 10    Other Seated Exercise thoracic excursion x1                     PT Education - 04/06/21 0956     Education Details HEP    Person(s) Educated Patient    Methods Explanation    Comprehension Verbalized understanding;Returned demonstration              PT Short Term Goals - 04/06/21 1059       PT SHORT TERM GOAL #1   Title Pt to be I in HEP to allow pt pain to be no greater than a 5/10    Time 2    Period Weeks    Status New    Target Date 04/21/21      PT SHORT TERM GOAL #2   Title PT to be able to rotate her cervical spine 65 degrees to the rt; 55 to the lt    Time 2    Period Weeks               PT Long Term Goals - 04/06/21 1055       PT LONG TERM GOAL #1   Title PT to be I in HEp to decrease her pain to no greater than a 4/10 in the past week.    Time 4    Period Weeks    Status New    Target Date 05/05/21      PT LONG TERM GOAL #2   Title Pt cervical rotation to be improved to RT 70 degrees LT 65 to aid in safety of driving.    Time 4    Period Weeks    Status New    Target Date 05/05/21      PT LONG TERM GOAL #3   Title PT core strength to improve to allow pt to be able to roll easily in bed    Time 4    Period Weeks    Status New      PT LONG TERM GOAL #4   Title Pt to states house work is easier to complete by 25%    Time 4    Period Weeks    Status New      PT LONG TERM GOAL #5   Title LE strength to be at least 4/5 B to allow sit to stand and steps to be easier for pt to complete.    Time 4    Period Weeks                     Plan - 04/06/21 1049     Clinical Impression Statement Pt is a 76 yo female who has cervical, thoracic and lumbar pain all of which is chroinc.  Hx of cervical fusion.  She  comes for evaluation and treatment.  Evaluation demonstrates forward bent posture, decreased, ROM, decrased strength, decreased activity tolerance and increased pain.  She is only able to afford to come to therapy one time a week therefore her treatment will rely heavily on HEP    Personal Factors and Comorbidities Age;Behavior Pattern;Comorbidity 1;Comorbidity 2;Finances;Fitness;Past/Current Experience;Time since onset of injury/illness/exacerbation    Examination-Activity Limitations Bathing;Bed Mobility;Bend;Caring for Others;Carry;Dressing;Lift;Locomotion Level;Reach Overhead;Sit;Sleep;Stairs;Stand    Examination-Participation Restrictions Cleaning;Community Activity;Laundry;Meal Prep;Shop    Stability/Clinical Decision Making Evolving/Moderate complexity    Clinical Decision Making Moderate    Rehab Potential Fair    PT Frequency 1x / week    PT Duration 4 weeks   1 time a week   PT Treatment/Interventions Patient/family education;Therapeutic exercise;Manual techniques    PT Next Visit Plan ab set, bent knee raise bridge, cervical excursions, bed mobility as an exercise and t band postural decompression,  give as HEP,  progress to standing postural tband exercises,    PT Home Exercise Plan sititng w back, scapular retraction, thoracic excursion, knee to chest    Consulted and Agree with Plan of Care Patient             Patient will benefit from skilled therapeutic intervention in order to improve the following deficits and impairments:  Abnormal gait, Decreased activity tolerance, Decreased balance, Decreased mobility, Decreased range of motion, Difficulty walking, Decreased strength, Postural dysfunction, Impaired flexibility, Improper body mechanics, Pain  Visit Diagnosis: Cervicalgia -  Plan: PT plan of care cert/re-cert  Pain in thoracic spine - Plan: PT plan of care cert/re-cert  Radiculopathy, lumbar region - Plan: PT plan of care cert/re-cert     Problem List Patient Active Problem List   Diagnosis Date Noted   Tendonitis, Achilles, right 07/13/2019   Trochanteric bursitis, left hip 03/26/2019   Lumbar foraminal stenosis 09/06/2016   Surgery, elective    History of colonic polyps    ABDOMINAL BLOATING 58/52/7782   HELICOBACTER PYLORI GASTRITIS 11/17/2007   SCHATZKI'S RING 11/17/2007   GERD 11/17/2007   HIATAL HERNIA 42/35/3614   EOSINOPHILIC GASTROENTERITIS 43/15/4008   Other spondylosis with radiculopathy, lumbar region 11/17/2007   WEIGHT LOSS 11/17/2007   NAUSEA 11/17/2007   VOMITING 11/17/2007   HEARTBURN 11/17/2007   OTHER DYSPHAGIA 11/17/2007   DIARRHEA 11/17/2007   ABDOMINAL PAIN, HX OF 11/17/2007   CHOLECYSTECTOMY, HX OF 11/17/2007   Rayetta Humphrey, PT CLT [phone]  04/06/2021, 11:06 AM  [place], [place], [address] Phone: [phone]   Fax:  [phone]  Name: Patricia Hodges MRN: [id number] Date of Birth: 06/25/1945

## 2021-04-11 ENCOUNTER — Ambulatory Visit (HOSPITAL_COMMUNITY): Payer: Medicare HMO | Admitting: Physical Therapy

## 2021-04-20 ENCOUNTER — Encounter (HOSPITAL_COMMUNITY): Payer: Medicare HMO

## 2021-04-25 ENCOUNTER — Encounter (HOSPITAL_COMMUNITY): Payer: Medicare HMO

## 2021-04-25 DIAGNOSIS — U071 COVID-19: Secondary | ICD-10-CM | POA: Diagnosis not present

## 2021-05-04 ENCOUNTER — Encounter (HOSPITAL_COMMUNITY): Payer: Medicare HMO | Admitting: Physical Therapy

## 2021-05-26 ENCOUNTER — Observation Stay (HOSPITAL_COMMUNITY)
Admission: EM | Admit: 2021-05-26 | Discharge: 2021-05-27 | Disposition: A | Discharge status: Home / Self Care | Payer: Medicare HMO | Attending: Internal Medicine | Admitting: Internal Medicine

## 2021-05-26 ENCOUNTER — Encounter (HOSPITAL_COMMUNITY): Payer: Self-pay | Admitting: *Deleted

## 2021-05-26 ENCOUNTER — Observation Stay (HOSPITAL_BASED_OUTPATIENT_CLINIC_OR_DEPARTMENT_OTHER): Payer: Medicare HMO

## 2021-05-26 ENCOUNTER — Emergency Department (HOSPITAL_COMMUNITY): Payer: Medicare HMO

## 2021-05-26 ENCOUNTER — Other Ambulatory Visit: Payer: Self-pay

## 2021-05-26 DIAGNOSIS — Z87891 Personal history of nicotine dependence: Secondary | ICD-10-CM | POA: Diagnosis not present

## 2021-05-26 DIAGNOSIS — K219 Gastro-esophageal reflux disease without esophagitis: Secondary | ICD-10-CM | POA: Diagnosis not present

## 2021-05-26 DIAGNOSIS — G894 Chronic pain syndrome: Secondary | ICD-10-CM | POA: Diagnosis not present

## 2021-05-26 DIAGNOSIS — E785 Hyperlipidemia, unspecified: Secondary | ICD-10-CM

## 2021-05-26 DIAGNOSIS — Z20822 Contact with and (suspected) exposure to covid-19: Secondary | ICD-10-CM | POA: Diagnosis not present

## 2021-05-26 DIAGNOSIS — I4891 Unspecified atrial fibrillation: Secondary | ICD-10-CM | POA: Diagnosis not present

## 2021-05-26 DIAGNOSIS — Z85828 Personal history of other malignant neoplasm of skin: Secondary | ICD-10-CM | POA: Diagnosis not present

## 2021-05-26 DIAGNOSIS — J209 Acute bronchitis, unspecified: Secondary | ICD-10-CM

## 2021-05-26 DIAGNOSIS — E782 Mixed hyperlipidemia: Secondary | ICD-10-CM | POA: Diagnosis not present

## 2021-05-26 DIAGNOSIS — M1991 Primary osteoarthritis, unspecified site: Secondary | ICD-10-CM | POA: Diagnosis not present

## 2021-05-26 DIAGNOSIS — I4819 Other persistent atrial fibrillation: Secondary | ICD-10-CM

## 2021-05-26 DIAGNOSIS — R002 Palpitations: Secondary | ICD-10-CM | POA: Diagnosis present

## 2021-05-26 DIAGNOSIS — R030 Elevated blood-pressure reading, without diagnosis of hypertension: Secondary | ICD-10-CM | POA: Diagnosis not present

## 2021-05-26 DIAGNOSIS — R9431 Abnormal electrocardiogram [ECG] [EKG]: Secondary | ICD-10-CM | POA: Diagnosis not present

## 2021-05-26 DIAGNOSIS — I499 Cardiac arrhythmia, unspecified: Secondary | ICD-10-CM | POA: Diagnosis not present

## 2021-05-26 DIAGNOSIS — Z79899 Other long term (current) drug therapy: Secondary | ICD-10-CM | POA: Diagnosis not present

## 2021-05-26 DIAGNOSIS — J208 Acute bronchitis due to other specified organisms: Secondary | ICD-10-CM | POA: Diagnosis not present

## 2021-05-26 DIAGNOSIS — J069 Acute upper respiratory infection, unspecified: Secondary | ICD-10-CM | POA: Diagnosis not present

## 2021-05-26 HISTORY — DX: Hyperlipidemia, unspecified: E78.5

## 2021-05-26 LAB — COMPREHENSIVE METABOLIC PANEL
ALT: 23 U/L (ref 0–44)
AST: 27 U/L (ref 15–41)
Albumin: 4.2 g/dL (ref 3.5–5.0)
Alkaline Phosphatase: 98 U/L (ref 38–126)
Anion gap: 11 (ref 5–15)
BUN: 13 mg/dL (ref 8–23)
CO2: 24 mmol/L (ref 22–32)
Calcium: 9.3 mg/dL (ref 8.9–10.3)
Chloride: 103 mmol/L (ref 98–111)
Creatinine, Ser: 1.15 mg/dL — ABNORMAL HIGH (ref 0.44–1.00)
GFR, Estimated: 49 mL/min — ABNORMAL LOW (ref >60)
Glucose, Bld: 108 mg/dL — ABNORMAL HIGH (ref 70–99)
Potassium: 3.9 mmol/L (ref 3.5–5.1)
Sodium: 138 mmol/L (ref 135–145)
Total Bilirubin: 0.8 mg/dL (ref 0.3–1.2)
Total Protein: 7.8 g/dL (ref 6.5–8.1)

## 2021-05-26 LAB — ECHOCARDIOGRAM COMPLETE
AR max vel: 2.26 cm2
AV Area VTI: 2.18 cm2
AV Area mean vel: 1.88 cm2
AV Mean grad: 2 mmHg
AV Peak grad: 4.2 mmHg
Ao pk vel: 1.03 m/s
Area-P 1/2: 3.6 cm2
Height: 64 in
MV VTI: 3.52 cm2
S' Lateral: 2.6 cm
Weight: 2880 oz

## 2021-05-26 LAB — CBC WITH DIFFERENTIAL/PLATELET
Abs Immature Granulocytes: 0.05 10*3/uL (ref 0.00–0.07)
Basophils Absolute: 0.1 10*3/uL (ref 0.0–0.1)
Basophils Relative: 1 %
Eosinophils Absolute: 0.5 10*3/uL (ref 0.0–0.5)
Eosinophils Relative: 5 %
HCT: 48.7 % — ABNORMAL HIGH (ref 36.0–46.0)
Hemoglobin: 15.6 g/dL — ABNORMAL HIGH (ref 12.0–15.0)
Immature Granulocytes: 1 %
Lymphocytes Relative: 18 %
Lymphs Abs: 1.9 10*3/uL (ref 0.7–4.0)
MCH: 29.2 pg (ref 26.0–34.0)
MCHC: 32 g/dL (ref 30.0–36.0)
MCV: 91 fL (ref 80.0–100.0)
Monocytes Absolute: 0.8 10*3/uL (ref 0.1–1.0)
Monocytes Relative: 8 %
Neutro Abs: 7.3 10*3/uL (ref 1.7–7.7)
Neutrophils Relative %: 67 %
Platelets: 246 10*3/uL (ref 150–400)
RBC: 5.35 MIL/uL — ABNORMAL HIGH (ref 3.87–5.11)
RDW: 12.6 % (ref 11.5–15.5)
WBC: 10.7 10*3/uL — ABNORMAL HIGH (ref 4.0–10.5)
nRBC: 0 % (ref 0.0–0.2)

## 2021-05-26 LAB — RESP PANEL BY RT-PCR (FLU A&B, COVID) ARPGX2
Influenza A by PCR: NEGATIVE
Influenza B by PCR: NEGATIVE
SARS Coronavirus 2 by RT PCR: NEGATIVE

## 2021-05-26 LAB — TROPONIN I (HIGH SENSITIVITY)
Troponin I (High Sensitivity): 4 ng/L (ref <18)
Troponin I (High Sensitivity): 4 ng/L (ref <18)

## 2021-05-26 LAB — TSH: TSH: 3.946 u[IU]/mL (ref 0.350–4.500)

## 2021-05-26 LAB — MRSA NEXT GEN BY PCR, NASAL: MRSA by PCR Next Gen: NOT DETECTED

## 2021-05-26 LAB — T4, FREE: Free T4: 0.81 ng/dL (ref 0.61–1.12)

## 2021-05-26 MED ORDER — APIXABAN 5 MG PO TABS
5.0000 mg | ORAL_TABLET | Freq: Two times a day (BID) | ORAL | Status: DC
  Administered 2021-05-26 – 2021-05-27 (×2): 5 mg via ORAL
  Filled 2021-05-26 (×2): qty 1

## 2021-05-26 MED ORDER — ONDANSETRON HCL 4 MG/2ML IJ SOLN: 4.0000 mg | Freq: Four times a day (QID) | INTRAMUSCULAR | Status: DC | PRN

## 2021-05-26 MED ORDER — DILTIAZEM LOAD VIA INFUSION
10.0000 mg | Freq: Once | INTRAVENOUS | Status: AC
  Administered 2021-05-26: 10 mg via INTRAVENOUS
  Filled 2021-05-26: qty 10

## 2021-05-26 MED ORDER — ONDANSETRON HCL 4 MG PO TABS: 4.0000 mg | ORAL_TABLET | Freq: Four times a day (QID) | ORAL | Status: DC | PRN

## 2021-05-26 MED ORDER — ACETAMINOPHEN 650 MG RE SUPP
650.0000 mg | Freq: Four times a day (QID) | RECTAL | Status: DC | PRN
Start: 2021-05-26 — End: 2021-05-27

## 2021-05-26 MED ORDER — CHLORHEXIDINE GLUCONATE CLOTH 2 % EX PADS: 6.0000 | MEDICATED_PAD | Freq: Every day | CUTANEOUS | Status: DC

## 2021-05-26 MED ORDER — PANTOPRAZOLE SODIUM 40 MG PO TBEC
40.0000 mg | DELAYED_RELEASE_TABLET | Freq: Every day | ORAL | Status: DC
  Administered 2021-05-26 – 2021-05-27 (×2): 40 mg via ORAL
  Filled 2021-05-26 (×2): qty 1

## 2021-05-26 MED ORDER — OMEPRAZOLE MAGNESIUM 20 MG PO TBEC: 20.0000 mg | DELAYED_RELEASE_TABLET | Freq: Every day | ORAL | Status: DC

## 2021-05-26 MED ORDER — EZETIMIBE 10 MG PO TABS
10.0000 mg | ORAL_TABLET | Freq: Every day | ORAL | Status: DC
  Administered 2021-05-27: 10 mg via ORAL
  Filled 2021-05-26: qty 1

## 2021-05-26 MED ORDER — DILTIAZEM HCL-DEXTROSE 125-5 MG/125ML-% IV SOLN (PREMIX)
5.0000 mg/h | INTRAVENOUS | Status: DC
  Administered 2021-05-26: 5 mg/h via INTRAVENOUS
  Filled 2021-05-26 (×2): qty 125

## 2021-05-26 MED ORDER — ACETAMINOPHEN 325 MG PO TABS: 650.0000 mg | ORAL_TABLET | Freq: Four times a day (QID) | ORAL | Status: DC | PRN

## 2021-05-26 NOTE — Plan of Care (Signed)

## 2021-05-26 NOTE — Progress Notes (Signed)
*  PRELIMINARY RESULTS* ?Echocardiogram ?2D Echocardiogram has been performed. ? ?Patricia Hodges ?05/26/2021, 1:43 PM ?

## 2021-05-26 NOTE — H&P (Signed)
?History and Physical  ? ? ?Patient: Patricia Hodges ZJI:[id number] DOB: 09/20/1945 ?DOA: 05/26/2021 ?DOS: the patient was seen and examined on 05/26/2021 ?PCP: Sharilyn Sites, MD  ?Patient coming from: Home ? ?Chief Complaint:  ?Chief Complaint  ?Patient presents with  ? Palpitations  ? ?HPI: Patricia Hodges is a  76 year old female with a history of hyperlipidemia, skin cancer, GERD with Schatzki's ring presenting with atrial fibrillation with RVR from her PCPs office.  The patient went to see her PCP on the morning of 05/26/2021 for 2 to 3-day history of coughing, congestion, and palpitations.  The patient has had a nonproductive cough with nasal congestion and sinus pressure and headache.  She denies any fevers, chills, chest pain, shortness of breath.  Her cough has been nonproductive.  She denies any hemoptysis.  During her office visit, EKG was obtained and showed that the patient was in atrial fibrillation with RVR.  The patient was subsequently sent to the emergency department for further evaluation and treatment.  In retrospect, the patient states that she has been having palpitations with dyspnea on exertion and dizziness intermittently over the past 3 months.  She has had some near syncopal episodes.  She denies any new medications.  She denies any nausea, vomiting or diarrhea abdominal pain, dysuria, hematuria. ?ED ?In the emergency department, the patient was afebrile hemodynamically stable with oxygen saturation 96% on room air.  Patient initially had heart rate 140-150.  She was started on diltiazem drip with improvement of her heart rate to less than 100.  BMP showed sodium 138, potassium 3.9, bicarbonate 24, serum creatinine 1.15.  LFTs were unremarkable.  WBC 10.7, hemoglobin 15.6, platelets 246,000.  Chest x-ray was negative for any acute infiltrates.  Troponin was 4.  The patient was admitted for further evaluation and treatment of atrial fibrillation.  ?Review of Systems: As mentioned in  the history of present illness. All other systems reviewed and are negative. ?Past Medical History:  ?Diagnosis Date  ? Basal cell carcinoma 04/25/2017  ? nod- left lower back (CX35FU)  ? Diverticulosis   ? Dysphagia   ? Esophageal reflux   ? Gastroenteritis, eosinophilic   ? H pylori ulcer   ? High cholesterol   ? Osteoarthritis   ? S/P colonoscopy 2006  ? diverticulosis, due for repeat in 2016  ? S/P endoscopy 2007  ? Schatzki's Ring  ? SCC (squamous cell carcinoma) 07/22/2019  ? Left anterior neck- (cx59f)  ? Squamous cell carcinoma of skin 02/06/2011  ? in situ (CX35FU)  ? ?Past Surgical History:  ?Procedure Laterality Date  ? ANTERIOR CERVICAL DECOMP/DISCECTOMY FUSION N/A 01/30/2016  ? Procedure: C5-6 Anterior Cervical Discectomy and Fusion, Allograft, Plate;  Surgeon: MMarybelle Killings MD;  Location: [facility]  Service: Orthopedics;  Laterality: N/A;  ? bladder tack    ? BUNIONECTOMY Right   ? CARPAL TUNNEL RELEASE Left   ? CATARACT EXTRACTION W/PHACO Right 11/28/2015  ? Procedure: CATARACT EXTRACTION PHACO AND INTRAOCULAR LENS PLACEMENT (IOC) RIGHT;  Surgeon: KTonny Branch MD;  Location: [facility];  Service: Ophthalmology;  Laterality: Right;  CDE: 6.96  ? CATARACT EXTRACTION W/PHACO Left 12/15/2015  ? Procedure: CATARACT EXTRACTION PHACO AND INTRAOCULAR LENS PLACEMENT LEFT EYE CDE=9.04;  Surgeon: KTonny Branch MD;  Location: [facility];  Service: Ophthalmology;  Laterality: Left;  left  ? CESAREAN SECTION    ? CHOLECYSTECTOMY    ? COLONOSCOPY  2006  ? COLONOSCOPY N/A 09/08/2014  ? Procedure: COLONOSCOPY;  Surgeon: RDaneil Dolin  MD;  [facility];  Service: Endoscopy;  Laterality: N/A;  10:30 AM  ? COLONOSCOPY N/A 11/20/2017  ? Procedure: COLONOSCOPY;  Surgeon: Daneil Dolin, MD;  [facility];  Service: Endoscopy;  Laterality: N/A;  10:30  ? feet surgery    ? HAND SURGERY Right   ? on ring finger due to swollen knuckle  ? Hx schatski's ring    ? POLYPECTOMY  11/20/2017  ? Procedure: POLYPECTOMY;  Surgeon:  Daneil Dolin, MD;  [facility];  Service: Endoscopy;;  ascending colon (CSx1) descending colon(CSx1)  ? S/P Hysterectomy    ? ?Social History:  reports that she quit smoking about 42 years ago. Her smoking use included cigarettes. She has a 20.00 pack-year smoking history. She has never used smokeless tobacco. She reports that she does not drink alcohol and does not use drugs. ? ?Allergies  ?Allergen Reactions  ? Nsaids Nausea And Vomiting  ? Alendronate Sodium Other (See Comments)  ?  Muscle pain   ? Aspirin Nausea Only and Other (See Comments)  ?  Abdominal Pain  ? Augmentin [Amoxicillin-Pot Clavulanate] Other (See Comments)  ?  Abdominal pain and chest pains  ? Celecoxib Nausea Only and Other (See Comments)  ?  Abdominal Pain  ? Ezetimibe-Simvastatin Other (See Comments)  ?  Muscle pain  ? Rofecoxib Other (See Comments)  ?  REACTION: Unknown reaction  ? Statins Other (See Comments)  ?  Bones/muscle pain  ? ? ?Family History  ?Problem Relation Age of Onset  ? Colon cancer Neg Hx   ? ? ?Prior to Admission medications   ?Medication Sig Start Date End Date Taking? Authorizing Provider  ?CALCIUM-VITAMIN D PO Take 1 tablet by mouth daily.   Yes [provider]  ?ezetimibe (ZETIA) 10 MG tablet Take 10 mg by mouth daily. 05/15/19  Yes [provider]  ?omeprazole (PRILOSEC OTC) 20 MG tablet Take 20 mg by mouth daily.   Yes [provider]  ?oxyCODONE (OXY IR/ROXICODONE) 5 MG immediate release tablet Take 5 mg by mouth every 6 (six) hours as needed for moderate pain. 01/26/21  Yes [provider]  ? ? ?Physical Exam: ?Vitals:  ? 05/26/21 0930 05/26/21 0940 05/26/21 0945 05/26/21 1000  ?BP: (!) 137/115 119/73  (!) 129/106  ?Pulse: 100 88 94 95  ?Resp: 20     ?Temp:      ?TempSrc:      ?SpO2: 97% 97% 97% 92%  ?Weight:      ?Height:      ? ?GENERAL:  A&O x 3, NAD, well developed, cooperative, follows commands ?HEENT: [place]/AT, No thrush, No icterus, No oral ulcers ?Neck:  No  neck mass, No meningismus, soft, supple ?CV: IRRR, no S3, no S4, no rub, no JVD ?Lungs: Bibasilar rales, no wheeze, no rhonchi, good air movement ?Abd: soft/NT +BS, nondistended ?Ext: No edema, no lymphangitis, no cyanosis, no rashes ?Neuro:  CN II-XII intact, strength 4/5 in RUE, RLE, strength 4/5 LUE, LLE; sensation intact bilateral; no dysmetria; babinski equivocal  ?Data Reviewed: ?Data reviewed and the history ?Assessment and Plan: ?* Atrial fibrillation with RVR ([place]) ?Type unspecified ?Currently not a candidate for cardioversion given duration of symptoms and not on anticoagulation ?Continue diltiazem drip ?CHADS-VAc = 3 (age x 2, female) ?Start apixaban ?Echocardiogram ?TSH ? ? ?Hyperlipidemia ?Continue Zetia ? ?Acute bronchitis ?COVID 19 and influenza are negative ?-Symptomatic treatment ?-Stable on room air ? ?GERD ?Continue PPI ? ? ? ? ?  Advance Care Planning:   Code Status: Prior FULL ? ?Consults: cardiology ? ?Family Communication: none present ? ?Severity of Illness: ?The appropriate patient status for this patient is OBSERVATION. Observation status is judged to be reasonable and necessary in order to provide the required intensity of service to ensure the patient's safety. The patient's presenting symptoms, physical exam findings, and initial radiographic and laboratory data in the context of their medical condition is felt to place them at decreased risk for further clinical deterioration. Furthermore, it is anticipated that the patient will be medically stable for discharge from the hospital within 2 midnights of admission.  ? ?Author: ?Orson Eva, MD ?05/26/2021 11:15 AM ? ?For on call review [email or web address].  ?

## 2021-05-26 NOTE — ED Notes (Signed)
Kathryne Eriksson, pt's daughter-in-law at bedside. Pt states she would like to make her an emergency contact person. Mrs. Lambrecht's number is [phone].  ?

## 2021-05-26 NOTE — Assessment & Plan Note (Signed)
Continue PPI ?

## 2021-05-26 NOTE — Consult Note (Signed)
Cardiology Consultation:   Patient ID: Patricia Hodges; [id number]; 04/25/1945   Admit date: 05/26/2021 Date of Consult: 05/26/2021  Primary Care Provider: Sharilyn Sites, MD Primary Cardiologist: New to [facility] Primary Electrophysiologist: None   Patient Profile:   Patricia Hodges is a 76 y.o. female with a history of hyperlipidemia, GERD, skin cancer, and atherosclerosis by CT imaging who is being seen today for the evaluation of newly documented atrial fibrillation at the request of Dr. Carles Collet.  History of Present Illness:   [name] presented to see her PCP at [facility] today complaining of a head cold for the last week.  She was noted to have an irregular and rapid heart rate on examination and was referred to the [place], diagnosed with coarse atrial fibrillation of uncertain duration.  She does not specifically report any sense of palpitations.  She was started on intravenous diltiazem at 5 mg/h with heart rates now in the 80s to 90s at rest.  CHA2DS2-VASc score is 4.  She reports no history of hypertension, diabetes, cardiomyopathy, or previous stroke.  She has not had an echocardiogram.  High-sensitivity troponin I levels are normal.  Past Medical History:  Diagnosis Date   Basal cell carcinoma 04/25/2017   nod- left lower back (CX35FU)   Diverticulosis    Dysphagia    Esophageal reflux    Gastroenteritis, eosinophilic    H pylori ulcer    Hyperlipidemia    Osteoarthritis    S/P colonoscopy 2006   diverticulosis, due for repeat in 2016   S/P endoscopy 2007   Schatzki's Ring   SCC (squamous cell carcinoma) 07/22/2019   Left anterior neck- (cx2f)   Squamous cell carcinoma of skin 02/06/2011   in situ (CX35FU)    Past Surgical History:  Procedure Laterality Date   ANTERIOR CERVICAL DECOMP/DISCECTOMY FUSION N/A 01/30/2016   Procedure: C5-6 Anterior Cervical Discectomy and Fusion, Allograft, Plate;  Surgeon: MMarybelle Killings MD;  [facility]  Service:  Orthopedics;  Laterality: N/A;   bladder tack     BUNIONECTOMY Right    CARPAL TUNNEL RELEASE Left    CATARACT EXTRACTION W/PHACO Right 11/28/2015   Procedure: CATARACT EXTRACTION PHACO AND INTRAOCULAR LENS PLACEMENT (IOC) RIGHT;  Surgeon: KTonny Branch MD;  [facility];  Service: Ophthalmology;  Laterality: Right;  CDE: 6.96   CATARACT EXTRACTION W/PHACO Left 12/15/2015   Procedure: CATARACT EXTRACTION PHACO AND INTRAOCULAR LENS PLACEMENT LEFT EYE CDE=9.04;  Surgeon: KTonny Branch MD;  Location[facility];  Service: Ophthalmology;  Laterality: Left;  left   CESAREAN SECTION     CHOLECYSTECTOMY     COLONOSCOPY  2006   COLONOSCOPY N/A 09/08/2014   Procedure: COLONOSCOPY;  Surgeon: RDaneil Dolin MD;  [facility];  Service: Endoscopy;  Laterality: N/A;  10:30 AM   COLONOSCOPY N/A 11/20/2017   Procedure: COLONOSCOPY;  Surgeon: RDaneil Dolin MD;  [facility];  Service: Endoscopy;  Laterality: N/A;  10:30   feet surgery     HAND SURGERY Right    on ring finger due to swollen knuckle   Hx schatski's ring     POLYPECTOMY  11/20/2017   Procedure: POLYPECTOMY;  Surgeon: RDaneil Dolin MD;  [facility];  Service: Endoscopy;;  ascending colon (CSx1) descending colon(CSx1)   S/P Hysterectomy       Outpatient Medications: No current facility-administered medications on file prior to encounter.   Current Outpatient Medications on File Prior to Encounter  Medication Sig Dispense Refill   CALCIUM-VITAMIN D PO Take 1 tablet by mouth daily.     ezetimibe (ZETIA) 10 MG tablet Take 10 mg by mouth daily.     omeprazole (PRILOSEC OTC) 20 MG tablet Take 20 mg by mouth daily.     oxyCODONE (OXY IR/ROXICODONE) 5 MG immediate release tablet Take 5 mg by mouth every 6 (six) hours as needed for moderate pain.        Allergies:    Allergies  Allergen Reactions   Nsaids Nausea And Vomiting   Alendronate Sodium Other (See Comments)    Muscle pain    Aspirin Nausea Only and  Other (See Comments)    Abdominal Pain   Augmentin [Amoxicillin-Pot Clavulanate] Other (See Comments)    Abdominal pain and chest pains   Celecoxib Nausea Only and Other (See Comments)    Abdominal Pain   Ezetimibe-Simvastatin Other (See Comments)    Muscle pain   Rofecoxib Other (See Comments)    REACTION: Unknown reaction   Statins Other (See Comments)    Bones/muscle pain    Social History:   Social History   Tobacco Use   Smoking status: Former    Packs/day: 1.00    Years: 20.00    Pack years: 20.00    Types: Cigarettes    Quit date: 11/23/1978    Years since quitting: 42.5   Smokeless tobacco: Never   Tobacco comments:    quit about 30 yrs ago  Substance Use Topics   Alcohol use: No     Family History:   The patient's family history is negative for Colon cancer.  ROS:  Please see the history of present illness.  All other ROS reviewed and negative.     Physical Exam/Data:   Vitals:   05/26/21 0930 05/26/21 0940 05/26/21 0945 05/26/21 1000  BP: (!) 137/115 119/73  (!) 129/106  Pulse: 100 88 94 95  Resp: 20     Temp:      TempSrc:      SpO2: 97% 97% 97% 92%  Weight:      Height:       No intake or output data in the 24 hours ending 05/26/21 1145 Filed Weights   05/26/21 0819  Weight: 81.6 kg   Body mass index is 30.9 kg/m.   Gen: Patient appears comfortable at rest. HEENT: Conjunctiva and lids normal, wearing a mask. Neck: Supple, no elevated JVP or carotid bruits, no thyromegaly. Lungs: Clear to auscultation, nonlabored breathing at rest. Cardiac: Irregularly irregular, no S3, 1/6 systolic murmur, no pericardial rub. Abdomen: Soft, nontender, bowel sounds present. Extremities: No pitting edema, distal pulses 2+. Skin: Warm and dry. Musculoskeletal: No kyphosis. Neuropsychiatric: Alert and oriented x3, affect grossly appropriate.  EKG:  An ECG dated 05/26/2021 was personally reviewed today and demonstrated:  Course atrial  fibrillation.  Telemetry:  I personally reviewed telemetry which shows atrial fibrillation.  Relevant CV Studies:  No previous cardiac testing for review.  Laboratory Data:  Chemistry Recent Labs  Lab 05/26/21 0834  NA 138  K 3.9  CL 103  CO2 24  GLUCOSE 108*  BUN 13  CREATININE 1.15*  CALCIUM 9.3  GFRNONAA 49*  ANIONGAP 11    Recent Labs  Lab 05/26/21 0834  PROT 7.8  ALBUMIN 4.2  AST 27  ALT 23  ALKPHOS 98  BILITOT 0.8   Hematology Recent Labs  Lab 05/26/21 0834  WBC 10.7*  RBC 5.35*  HGB 15.6*  HCT 48.7*  MCV 91.0  MCH 29.2  MCHC 32.0  RDW 12.6  PLT 246   Cardiac Enzymes Recent Labs  Lab 05/26/21 0834 05/26/21 1025  TROPONINIHS 4 4    Radiology/Studies:  DG Chest Portable 1 View  Result Date: 05/26/2021 CLINICAL DATA:  Irregular heartbeat. EXAM: PORTABLE CHEST 1 VIEW COMPARISON:  January 12, 2019. FINDINGS: Stable cardiomediastinal silhouette. Both lungs are clear. The visualized skeletal structures are unremarkable. IMPRESSION: No active disease. Electronically Signed   By: Marijo Conception M.D.   On: 05/26/2021 10:02    Assessment and Plan:   1.  Persistent atrial fibrillation, newly diagnosed and of uncertain duration.  CHA2DS2-VASc score is 4.  Currently on IV diltiazem at 5 mg/h with adequate heart rate control.  High-sensitivity troponin I levels are normal arguing against ACS.  COVID-19 and influenza panels are negative.  2.  Elevated blood pressure at presentation without standing history of essential hypertension.  Continue to monitor.  3.  Mixed hyperlipidemia on Zetia with history of statin intolerance due to myalgias.  Would convert from IV diltiazem to Cardizem 30 mg p.o. every 6 hours, if heart rate control adequate tomorrow convert to Cardizem CD 120 mg daily (could be higher dose if necessary).  Discussed stroke prophylaxis with anticoagulation in light of CHA2DS2-VASc score of 4.  Suggest Eliquis.  Check echocardiogram for  cardiac structural assessment, also TSH.  Anticipate that she could be discharged home once on stable oral regimen with good heart rate control.  She can be set up for outpatient cardiology follow-up over the next 3 to 4 weeks and consideration of elective cardioversion can be reviewed at that time.  Signed, Rozann Lesches, MD  05/26/2021 11:45 AM

## 2021-05-26 NOTE — ED Provider Notes (Signed)
[facility] EMERGENCY DEPARTMENT Provider Note  CSN: [id number] Arrival date & time: 05/26/21 7341  Chief Complaint(s) Palpitations  HPI Patricia Hodges is a 76 y.o. female who presents emergency department for evaluation of cough, congestion and palpitations.  Patient states that for the last 3 days she has had upper respiratory symptoms and went to [facility] who found the patient to be in A-fib with RVR, prompting transfer to the emergency department.  Patient states that over the last 2 months she has felt intermittent palpitations and worsening dyspnea on exertion.  She currently denies chest pain or shortness of breath at rest.  Denies abdominal pain, nausea, vomiting, fever or other systemic symptoms.  Is not on anticoagulation.   Palpitations Associated symptoms: cough and shortness of breath    Past Medical History Past Medical History:  Diagnosis Date   Basal cell carcinoma 04/25/2017   nod- left lower back (CX35FU)   Diverticulosis    Dysphagia    Esophageal reflux    Gastroenteritis, eosinophilic    H pylori ulcer    High cholesterol    Osteoarthritis    S/P colonoscopy 2006   diverticulosis, due for repeat in 2016   S/P endoscopy 2007   Schatzki's Ring   SCC (squamous cell carcinoma) 07/22/2019   Left anterior neck- (cx17f)   Squamous cell carcinoma of skin 02/06/2011   in situ (CX35FU)   Patient Active Problem List   Diagnosis Date Noted   Tendonitis, Achilles, right 07/13/2019   Trochanteric bursitis, left hip 03/26/2019   Lumbar foraminal stenosis 09/06/2016   Surgery, elective    History of colonic polyps    ABDOMINAL BLOATING 093/79/0240  HELICOBACTER PYLORI GASTRITIS 11/17/2007   SCHATZKI'S RING 11/17/2007   GERD 11/17/2007   HIATAL HERNIA 097/35/3299  EOSINOPHILIC GASTROENTERITIS 024/26/8341  Other spondylosis with radiculopathy, lumbar region 11/17/2007   WEIGHT LOSS 11/17/2007   NAUSEA 11/17/2007   VOMITING 11/17/2007    HEARTBURN 11/17/2007   OTHER DYSPHAGIA 11/17/2007   DIARRHEA 11/17/2007   ABDOMINAL PAIN, HX OF 11/17/2007   CHOLECYSTECTOMY, HX OF 11/17/2007   Home Medication(s) Prior to Admission medications   Medication Sig Start Date End Date Taking? Authorizing Provider  CALCIUM-VITAMIN D PO Take 1 tablet by mouth daily.    [provider]  ezetimibe (ZETIA) 10 MG tablet Take 10 mg by mouth daily. 05/15/19   [provider]  FLUoxetine (PROZAC) 10 MG tablet Take 10 mg by mouth every other day.     [provider]  HYDROcodone-acetaminophen (NORCO/VICODIN) 5-325 MG tablet Take 1 tablet by mouth daily as needed for moderate pain.  Patient not taking: Reported on 02/23/2021    [provider]  omeprazole (PRILOSEC OTC) 20 MG tablet Take 20 mg by mouth daily.    [provider]  oxyCODONE (OXY IR/ROXICODONE) 5 MG immediate release tablet Take 5 mg by mouth every 6 (six) hours. 01/26/21   [provider]  Past Surgical History Past Surgical History:  Procedure Laterality Date   ANTERIOR CERVICAL DECOMP/DISCECTOMY FUSION N/A 01/30/2016   Procedure: C5-6 Anterior Cervical Discectomy and Fusion, Allograft, Plate;  Surgeon: [name], MD;  Location: [facility];  Service: Orthopedics;  Laterality: N/A;   bladder tack     BUNIONECTOMY Right    CARPAL TUNNEL RELEASE Left    CATARACT EXTRACTION W/PHACO Right 11/28/2015   Procedure: CATARACT EXTRACTION PHACO AND INTRAOCULAR LENS PLACEMENT (IOC) RIGHT;  Surgeon: Tonny Branch, MD;  Location: [facility];  Service: Ophthalmology;  Laterality: Right;  CDE: 6.96   CATARACT EXTRACTION W/PHACO Left 12/15/2015   Procedure: CATARACT EXTRACTION PHACO AND INTRAOCULAR LENS PLACEMENT LEFT EYE CDE=9.04;  Surgeon: Tonny Branch, MD;  Location: [facility];  Service: Ophthalmology;  Laterality: Left;  left   CESAREAN  SECTION     CHOLECYSTECTOMY     COLONOSCOPY  2006   COLONOSCOPY N/A 09/08/2014   Procedure: COLONOSCOPY;  Surgeon: Daneil Dolin, MD;  [facility]: [facility];  Service: Endoscopy;  Laterality: N/A;  10:30 AM   COLONOSCOPY N/A 11/20/2017   Procedure: COLONOSCOPY;  Surgeon: Daneil Dolin, MD;  Location: [facility];  Service: Endoscopy;  Laterality: N/A;  10:30   feet surgery     HAND SURGERY Right    on ring finger due to swollen knuckle   Hx schatski's ring     POLYPECTOMY  11/20/2017   Procedure: POLYPECTOMY;  Surgeon: Daneil Dolin, MD;  Location: [facility];  Service: Endoscopy;;  ascending colon (CSx1) descending colon(CSx1)   S/P Hysterectomy     Family History Family History  Problem Relation Age of Onset   Colon cancer Neg Hx     Social History Social History   Tobacco Use   Smoking status: Former    Packs/day: 1.00    Years: 20.00    Pack years: 20.00    Types: Cigarettes    Quit date: 11/23/1978    Years since quitting: 42.5   Smokeless tobacco: Never   Tobacco comments:    quit about 30 yrs ago  Vaping Use   Vaping Use: Never used  Substance Use Topics   Alcohol use: No   Drug use: No   Allergies Alendronate sodium, Aspirin, Augmentin [amoxicillin-pot clavulanate], Celecoxib, Ezetimibe-simvastatin, Rofecoxib, and Statins  Review of Systems Review of Systems  HENT:  Positive for congestion.   Respiratory:  Positive for cough and shortness of breath.   Cardiovascular:  Positive for palpitations.   Physical Exam Vital Signs  I have reviewed the triage vital signs BP (!) 158/104 (BP Location: Right Arm)    Pulse (!) 123    Temp 98.7 F (37.1 C) (Oral)    Resp 16    Ht '5\' 4"'$  (1.626 m)    Wt 81.6 kg    SpO2 99%    BMI 30.90 kg/m   Physical Exam Vitals and nursing note reviewed.  Constitutional:      General: She is not in acute distress.    Appearance: She is well-developed.  HENT:     Head: Normocephalic and atraumatic.  Eyes:      Conjunctiva/sclera: Conjunctivae normal.  Cardiovascular:     Rate and Rhythm: Tachycardia present. Rhythm irregular.     Heart sounds: No murmur heard. Pulmonary:     Effort: Pulmonary effort is normal. No respiratory distress.     Breath sounds: Normal breath sounds.  Abdominal:     Palpations: Abdomen is soft.  Tenderness: There is no abdominal tenderness.  Musculoskeletal:        General: No swelling.     Cervical back: Neck supple.  Skin:    General: Skin is warm and dry.     Capillary Refill: Capillary refill takes less than 2 seconds.  Neurological:     Mental Status: She is alert.  Psychiatric:        Mood and Affect: Mood normal.    ED Results and Treatments Labs (all labs ordered are listed, but only abnormal results are displayed) Labs Reviewed  COMPREHENSIVE METABOLIC PANEL  CBC WITH DIFFERENTIAL/PLATELET  TROPONIN I (HIGH SENSITIVITY)                                                                                                                          Radiology No results found.  Pertinent labs & imaging results that were available during my care of the patient were reviewed by me and considered in my medical decision making (see MDM for details).  Medications Ordered in ED Medications - No data to display                                                                                                                                   Procedures .Critical Care Performed by: Teressa Lower, MD Authorized by: Teressa Lower, MD   Critical care provider statement:    Critical care time (minutes):  30   Critical care was necessary to treat or prevent imminent or life-threatening deterioration of the following conditions:  Cardiac failure   Critical care was time spent personally by me on the following activities:  Development of treatment plan with patient or surrogate, discussions with consultants, evaluation of patient's response to treatment,  examination of patient, ordering and review of laboratory studies, ordering and review of radiographic studies, ordering and performing treatments and interventions, pulse oximetry, re-evaluation of patient's condition and review of old charts  (including critical care time)  Medical Decision Making / ED Course   This patient presents to the ED for concern of palpitations shortness of breath, this involves an extensive number of treatment options, and is a complaint that carries with it a high risk of complications and morbidity.  The differential diagnosis includes atrial fibrillation, a flutter, CHF, ACS  MDM: Patient seen emergency department for evaluation of new onset a flutter/A-fib.  Physical exam with an irregular tachycardia  but is otherwise unremarkable.  Laboratory evaluation with mild leukocytosis to 10.7, hemoglobin 15.6, creatinine 1.15, troponin is normal.  COVID and flu negative.  Chest x-ray unremarkable.  ECG with A-fib with RVR and because patient has had multiple months of intermittent symptoms, not on anticoagulation, patient not a candidate for elective cardioversion in the emergency department and was started on a diltiazem drip.  Cardiology consulted recommending medical admission and they will evaluate the patient as well.  Patient then admitted to medicine.   Additional history obtained: -Additional history obtained from daughter -External records from outside source obtained and reviewed including: Chart review including previous notes, labs, imaging, consultation notes   Lab Tests: -I ordered, reviewed, and interpreted labs.   The pertinent results include:   Labs Reviewed  COMPREHENSIVE METABOLIC PANEL  CBC WITH DIFFERENTIAL/PLATELET  TROPONIN I (HIGH SENSITIVITY)      EKG  A-fib with RVR    Imaging Studies ordered: I ordered imaging studies including chest x-ray I independently visualized and interpreted imaging. I agree with the radiologist  interpretation   Medicines ordered and prescription drug management: No orders of the defined types were placed in this encounter.   -I have reviewed the patients home medicines and have made adjustments as needed  Critical interventions Diltiazem drip  Consultations Obtained: I requested consultation with the cardiologist,  and discussed lab and imaging findings as well as pertinent plan - they recommend: Admit to medicine, continue diltiazem   Cardiac Monitoring: The patient was maintained on a cardiac monitor.  I personally viewed and interpreted the cardiac monitored which showed an underlying rhythm of: A-fib versus a flutter  Social Determinants of Health:  Factors impacting patients care include: None   Reevaluation: After the interventions noted above, I reevaluated the patient and found that they have :improved  Co morbidities that complicate the patient evaluation  Past Medical History:  Diagnosis Date   Basal cell carcinoma 04/25/2017   nod- left lower back (CX35FU)   Diverticulosis    Dysphagia    Esophageal reflux    Gastroenteritis, eosinophilic    H pylori ulcer    High cholesterol    Osteoarthritis    S/P colonoscopy 2006   diverticulosis, due for repeat in 2016   S/P endoscopy 2007   Schatzki's Ring   SCC (squamous cell carcinoma) 07/22/2019   Left anterior neck- (cx5f)   Squamous cell carcinoma of skin 02/06/2011   in situ (CX35FU)      Dispostion: I considered admission for this patient, and due to new onset A-fib versus a flutter on a diltiazem drip patient will be admitted     Final Clinical Impression(s) / ED Diagnoses Final diagnoses:  None     '@PCDICTATION'$ @    KTeressa Lower MD 05/26/21 1456

## 2021-05-26 NOTE — Assessment & Plan Note (Addendum)
Type unspecified ?Currently not a candidate for cardioversion given duration of symptoms and not on anticoagulation ?Continue diltiazem drip>>transitioned to cardizem CD>>rate controlled ?CHADS-VAc = 4 (age x 2, female, ASVD) ?Started apixaban ?05/26/21 Echo EF 60-65%, no WMA, trivial MR ?TSH 3.946, Free T4--0.81 ? ?

## 2021-05-26 NOTE — Assessment & Plan Note (Signed)
Continue Zetia. °

## 2021-05-26 NOTE — Hospital Course (Addendum)
76 year old female with a history of hyperlipidemia, skin cancer, GERD with Schatzki's ring presenting with atrial fibrillation with RVR from her PCPs office.  The patient went to see her PCP on the morning of 05/26/2021 for 2 to 3-day history of coughing, congestion, and palpitations.  The patient has had a nonproductive cough with nasal congestion and sinus pressure and headache.  She denies any fevers, chills, chest pain, shortness of breath.  Her cough has been nonproductive.  She denies any hemoptysis.  During her office visit, EKG was obtained and showed that the patient was in atrial fibrillation with RVR.  The patient was subsequently sent to the emergency department for further evaluation and treatment.  In retrospect, the patient states that she has been having palpitations with dyspnea on exertion and dizziness intermittently over the past 3 months.  She has had some near syncopal episodes.  She denies any new medications.  She denies any nausea, vomiting or diarrhea abdominal pain, dysuria, hematuria. ?ED ?In the emergency department, the patient was afebrile hemodynamically stable with oxygen saturation 96% on room air.  Patient initially had heart rate 140-150.  She was started on diltiazem drip with improvement of her heart rate to less than 100.  BMP showed sodium 138, potassium 3.9, bicarbonate 24, serum creatinine 1.15.  LFTs were unremarkable.  WBC 10.7, hemoglobin 15.6, platelets 246,000.  Chest x-ray was negative for any acute infiltrates.  Troponin was 4.  The patient was admitted for further evaluation and treatment of atrial fibrillation. ? ?She was started on IV diltiazem with improvement of her HR.  She was transitioned to oral diltiazem without complications.  She was also started on apixaban without any signs of bleeding.  She was seen by cardiology who agreed with the plan and pt will follow up as outpt in 3-4 weeks. ?

## 2021-05-26 NOTE — ED Triage Notes (Signed)
Pt was sent to ED from [facility] for irregular heartbeat. Pt went to see her PCP today due to cold symptoms and dizziness. PCP noticed pt had an irregular heartbeat and did an EKG which showed A. Fib with RVR. Pt reports she has felt palpitations "for awhile now" intermittently.  ?

## 2021-05-26 NOTE — Assessment & Plan Note (Signed)
COVID 19 and influenza are negative ?-Symptomatic treatment ?-Stable on room air ?

## 2021-05-27 DIAGNOSIS — I4891 Unspecified atrial fibrillation: Secondary | ICD-10-CM | POA: Diagnosis not present

## 2021-05-27 DIAGNOSIS — J208 Acute bronchitis due to other specified organisms: Secondary | ICD-10-CM

## 2021-05-27 DIAGNOSIS — K219 Gastro-esophageal reflux disease without esophagitis: Secondary | ICD-10-CM | POA: Diagnosis not present

## 2021-05-27 DIAGNOSIS — E782 Mixed hyperlipidemia: Secondary | ICD-10-CM | POA: Diagnosis not present

## 2021-05-27 DIAGNOSIS — J209 Acute bronchitis, unspecified: Secondary | ICD-10-CM | POA: Diagnosis not present

## 2021-05-27 LAB — BASIC METABOLIC PANEL
Anion gap: 9 (ref 5–15)
BUN: 17 mg/dL (ref 8–23)
CO2: 26 mmol/L (ref 22–32)
Calcium: 8.7 mg/dL — ABNORMAL LOW (ref 8.9–10.3)
Chloride: 105 mmol/L (ref 98–111)
Creatinine, Ser: 1.23 mg/dL — ABNORMAL HIGH (ref 0.44–1.00)
GFR, Estimated: 45 mL/min — ABNORMAL LOW (ref >60)
Glucose, Bld: 111 mg/dL — ABNORMAL HIGH (ref 70–99)
Potassium: 4 mmol/L (ref 3.5–5.1)
Sodium: 140 mmol/L (ref 135–145)

## 2021-05-27 LAB — MAGNESIUM: Magnesium: 2.2 mg/dL (ref 1.7–2.4)

## 2021-05-27 MED ORDER — DILTIAZEM HCL ER COATED BEADS 120 MG PO CP24: 120.0000 mg | ORAL_CAPSULE | Freq: Every day | ORAL | 1 refills | Status: DC

## 2021-05-27 MED ORDER — APIXABAN 5 MG PO TABS: 5.0000 mg | ORAL_TABLET | Freq: Two times a day (BID) | ORAL | 1 refills | Status: DC

## 2021-05-27 MED ORDER — DILTIAZEM HCL ER COATED BEADS 120 MG PO CP24
120.0000 mg | ORAL_CAPSULE | Freq: Every day | ORAL | Status: DC
  Administered 2021-05-27: 120 mg via ORAL
  Filled 2021-05-27: qty 1

## 2021-05-27 NOTE — Discharge Instructions (Signed)

## 2021-05-27 NOTE — Discharge Summary (Signed)
Physician Discharge Summary   Patient: Patricia Hodges MRN: [id number] DOB: March 28, 1945  Admit date:     05/26/2021  Discharge date: 05/27/21  Discharge Physician: Shanon Brow Kevionna Heffler   PCP: Sharilyn Sites, MD   Recommendations at discharge:   Please follow up with primary care provider within 1-2 weeks  Please repeat BMP and CBC in one week Follow up with cardiology, Dr. Domenic Polite in 3-4 weeks     Hospital Course: 76 year old female with a history of hyperlipidemia, skin cancer, GERD with Schatzki's ring presenting with atrial fibrillation with RVR from her PCPs office.  The patient went to see her PCP on the morning of 05/26/2021 for 2 to 3-day history of coughing, congestion, and palpitations.  The patient has had a nonproductive cough with nasal congestion and sinus pressure and headache.  She denies any fevers, chills, chest pain, shortness of breath.  Her cough has been nonproductive.  She denies any hemoptysis.  During her office visit, EKG was obtained and showed that the patient was in atrial fibrillation with RVR.  The patient was subsequently sent to the emergency department for further evaluation and treatment.  In retrospect, the patient states that she has been having palpitations with dyspnea on exertion and dizziness intermittently over the past 3 months.  She has had some near syncopal episodes.  She denies any new medications.  She denies any nausea, vomiting or diarrhea abdominal pain, dysuria, hematuria. ED In the emergency department, the patient was afebrile hemodynamically stable with oxygen saturation 96% on room air.  Patient initially had heart rate 140-150.  She was started on diltiazem drip with improvement of her heart rate to less than 100.  BMP showed sodium 138, potassium 3.9, bicarbonate 24, serum creatinine 1.15.  LFTs were unremarkable.  WBC 10.7, hemoglobin 15.6, platelets 246,000.  Chest x-ray was negative for any acute infiltrates.  Troponin was 4.  The patient was  admitted for further evaluation and treatment of atrial fibrillation.  She was started on IV diltiazem with improvement of her HR.  She was transitioned to oral diltiazem without complications.  She was also started on apixaban without any signs of bleeding.  She was seen by cardiology who agreed with the plan and pt will follow up as outpt in 3-4 weeks.  Assessment and Plan: * Atrial fibrillation with RVR (HCC) Type unspecified Currently not a candidate for cardioversion given duration of symptoms and not on anticoagulation Continue diltiazem drip>>transitioned to cardizem CD>>rate controlled CHADS-VAc = 4 (age x 2, female, ASVD) Started apixaban 05/26/21 Echo EF 60-65%, no WMA, trivial MR TSH 3.946, Free T4--0.81   Hyperlipidemia Continue Zetia  Acute bronchitis COVID 19 and influenza are negative -Symptomatic treatment -Stable on room air  GERD Continue PPI         Consultants: cardiology Procedures performed: none  Disposition: Home Diet recommendation:  Cardiac diet DISCHARGE MEDICATION: Allergies as of 05/27/2021       Reactions   Nsaids Nausea And Vomiting   Alendronate Sodium Other (See Comments)   Muscle pain    Aspirin Nausea Only, Other (See Comments)   Abdominal Pain   Augmentin [amoxicillin-pot Clavulanate] Other (See Comments)   Abdominal pain and chest pains   Celecoxib Nausea Only, Other (See Comments)   Abdominal Pain   Ezetimibe-simvastatin Other (See Comments)   Muscle pain   Rofecoxib Other (See Comments)   REACTION: Unknown reaction   Statins Other (See Comments)   Bones/muscle pain        Medication  List     TAKE these medications    apixaban 5 MG Tabs tablet Commonly known as: ELIQUIS Take 1 tablet (5 mg total) by mouth 2 (two) times daily.   CALCIUM-VITAMIN D PO Take 1 tablet by mouth daily.   diltiazem 120 MG 24 hr capsule Commonly known as: CARDIZEM CD Take 1 capsule (120 mg total) by mouth daily.   ezetimibe 10 MG  tablet Commonly known as: ZETIA Take 10 mg by mouth daily.   omeprazole 20 MG tablet Commonly known as: PRILOSEC OTC Take 20 mg by mouth daily.   oxyCODONE 5 MG immediate release tablet Commonly known as: Oxy IR/ROXICODONE Take 5 mg by mouth every 6 (six) hours as needed for moderate pain.        Discharge Exam: Filed Weights   05/26/21 0819 05/26/21 1407 05/27/21 0612  Weight: 81.6 kg 84 kg 84.9 kg   HEENT:  [place]/AT, No thrush, no icterus CV:  IRRR, no rub, no S3, no S4 Lung:  bibasilar rales. no wheeze, no rhonchi Abd:  soft/+BS, NT Ext:  No edema, no lymphangitis, no synovitis, no rash   Condition at discharge: stable  The results of significant diagnostics from this hospitalization (including imaging, microbiology, ancillary and laboratory) are listed below for reference.   Imaging Studies: DG Chest Portable 1 View  Result Date: 05/26/2021 CLINICAL DATA:  Irregular heartbeat. EXAM: PORTABLE CHEST 1 VIEW COMPARISON:  January 12, 2019. FINDINGS: Stable cardiomediastinal silhouette. Both lungs are clear. The visualized skeletal structures are unremarkable. IMPRESSION: No active disease. Electronically Signed   By: Marijo Conception M.D.   On: 05/26/2021 10:02   ECHOCARDIOGRAM COMPLETE  Result Date: 05/26/2021    ECHOCARDIOGRAM REPORT   Patient Name:   Patricia Hodges Date of Exam: 05/26/2021 Medical Rec #:  [id number]            Height:       64.0 in Accession #:    [id number]           Weight:       180.0 lb Date of Birth:  10/20/1945            BSA:          1.871 m Patient Age:    11 years             BP:           133/86 mmHg Patient Gender: F                    HR:           94 bpm. Exam Location:  [facility] Procedure: 2D Echo, Color Doppler and Cardiac Doppler Indications:    Atrial Fibrillation  History:        Patient has no prior history of Echocardiogram examinations.                 Arrythmias:Atrial Fibrillation.  Sonographer:    Wenda Low Referring Phys:  [phone] Tieasha Larsen IMPRESSIONS  1. Left ventricular ejection fraction, by estimation, is 60 to 65%. The left ventricle has normal function. The left ventricle has no regional wall motion abnormalities. There is mild left ventricular hypertrophy. Left ventricular diastolic parameters are indeterminate.  2. Right ventricular systolic function is normal. The right ventricular size is normal. There is normal pulmonary artery systolic pressure. The estimated right ventricular systolic pressure is 03.5 mmHg.  3. Left atrial size was upper normal.  4. The mitral  valve is grossly normal. Trivial mitral valve regurgitation.  5. The aortic valve is tricuspid. Aortic valve regurgitation is not visualized. Aortic valve sclerosis is present, with no evidence of aortic valve stenosis. Aortic valve mean gradient measures 2.0 mmHg.  6. The inferior vena cava is normal in size with greater than 50% respiratory variability, suggesting right atrial pressure of 3 mmHg. Comparison(s): No prior Echocardiogram. FINDINGS  Left Ventricle: Left ventricular ejection fraction, by estimation, is 60 to 65%. The left ventricle has normal function. The left ventricle has no regional wall motion abnormalities. The left ventricular internal cavity size was normal in size. There is  mild left ventricular hypertrophy. Left ventricular diastolic function could not be evaluated due to atrial fibrillation. Left ventricular diastolic parameters are indeterminate. Right Ventricle: The right ventricular size is normal. No increase in right ventricular wall thickness. Right ventricular systolic function is normal. There is normal pulmonary artery systolic pressure. The tricuspid regurgitant velocity is 1.89 m/s, and  with an assumed right atrial pressure of 3 mmHg, the estimated right ventricular systolic pressure is 80.9 mmHg. Left Atrium: Left atrial size was upper normal. Right Atrium: Right atrial size was normal in size. Pericardium: There is no evidence of  pericardial effusion. Presence of epicardial fat layer. Mitral Valve: The mitral valve is grossly normal. Trivial mitral valve regurgitation. MV peak gradient, 4.5 mmHg. The mean mitral valve gradient is 2.0 mmHg. Tricuspid Valve: The tricuspid valve is grossly normal. Tricuspid valve regurgitation is trivial. Aortic Valve: The aortic valve is tricuspid. There is mild aortic valve annular calcification. Aortic valve regurgitation is not visualized. Aortic valve sclerosis is present, with no evidence of aortic valve stenosis. Aortic valve mean gradient measures  2.0 mmHg. Aortic valve peak gradient measures 4.2 mmHg. Aortic valve area, by VTI measures 2.18 cm. Pulmonic Valve: The pulmonic valve was grossly normal. Pulmonic valve regurgitation is trivial. Aorta: The aortic root is normal in size and structure. Venous: The inferior vena cava is normal in size with greater than 50% respiratory variability, suggesting right atrial pressure of 3 mmHg. IAS/Shunts: No atrial level shunt detected by color flow Doppler.  LEFT VENTRICLE PLAX 2D LVIDd:         4.00 cm   Diastology LVIDs:         2.60 cm   LV e' medial:    7.72 cm/s LV PW:         1.30 cm   LV E/e' medial:  10.3 LV IVS:        1.30 cm   LV e' lateral:   10.30 cm/s LVOT diam:     2.00 cm   LV E/e' lateral: 7.7 LV SV:         43 LV SV Index:   23 LVOT Area:     3.14 cm  RIGHT VENTRICLE RV Basal diam:  3.00 cm RV Mid diam:    2.30 cm RV S prime:     11.50 cm/s LEFT ATRIUM             Index        RIGHT ATRIUM           Index LA diam:        4.80 cm 2.57 cm/m   RA Area:     18.10 cm LA Vol (A2C):   63.3 ml 33.84 ml/m  RA Volume:   48.80 ml  26.09 ml/m LA Vol (A4C):   58.2 ml 31.11 ml/m LA Biplane Vol: 60.5 ml  32.34 ml/m  AORTIC VALVE                    PULMONIC VALVE AV Area (Vmax):    2.26 cm     PV Vmax:       0.77 m/s AV Area (Vmean):   1.88 cm     PV Peak grad:  2.4 mmHg AV Area (VTI):     2.18 cm AV Vmax:           102.50 cm/s AV Vmean:           70.550 cm/s AV VTI:            0.199 m AV Peak Grad:      4.2 mmHg AV Mean Grad:      2.0 mmHg LVOT Vmax:         73.80 cm/s LVOT Vmean:        42.200 cm/s LVOT VTI:          0.138 m LVOT/AV VTI ratio: 0.69  AORTA Ao Root diam: 2.90 cm MITRAL VALVE               TRICUSPID VALVE MV Area (PHT): 3.60 cm    TR Peak grad:   14.3 mmHg MV Area VTI:   3.52 cm    TR Vmax:        189.00 cm/s MV Peak grad:  4.5 mmHg MV Mean grad:  2.0 mmHg    SHUNTS MV Vmax:       1.06 m/s    Systemic VTI:  0.14 m MV Vmean:      54.1 cm/s   Systemic Diam: 2.00 cm MV Decel Time: 211 msec MV E velocity: 79.70 cm/s Rozann Lesches MD Electronically signed by Rozann Lesches MD Signature Date/Time: 05/26/2021/2:01:33 PM    Final     Microbiology: Results for orders placed or performed during the hospital encounter of 05/26/21  Resp Panel by RT-PCR (Flu A&B, Covid) Nasopharyngeal Swab     Status: None   Collection Time: 05/26/21  9:06 AM   Specimen: Nasopharyngeal Swab; Nasopharyngeal(NP) swabs in vial transport medium  Result Value Ref Range Status   SARS Coronavirus 2 by RT PCR NEGATIVE NEGATIVE Final    Comment: (NOTE) SARS-CoV-2 target nucleic acids are NOT DETECTED.  The SARS-CoV-2 RNA is generally detectable in upper respiratory specimens during the acute phase of infection. The lowest concentration of SARS-CoV-2 viral copies this assay can detect is 138 copies/mL. A negative result does not preclude SARS-Cov-2 infection and should not be used as the sole basis for treatment or other patient management decisions. A negative result may occur with  improper specimen collection/handling, submission of specimen other than nasopharyngeal swab, presence of viral mutation(s) within the areas targeted by this assay, and inadequate number of viral copies(<138 copies/mL). A negative result must be combined with clinical observations, patient history, and epidemiological information. The expected result is Negative.  Fact Sheet  for Patients:  EntrepreneurPulse.com.au  Fact Sheet for Healthcare Providers:  IncredibleEmployment.be  This test is no t yet approved or cleared by the [place] FDA and  has been authorized for detection and/or diagnosis of SARS-CoV-2 by FDA under an Emergency Use Authorization (EUA). This EUA will remain  in effect (meaning this test can be used) for the duration of the COVID-19 declaration under Section 564(b)(1) of the Act, 21 U.S.C.section 360bbb-3(b)(1), unless the authorization is terminated  or revoked sooner.       Influenza A  by PCR NEGATIVE NEGATIVE Final   Influenza B by PCR NEGATIVE NEGATIVE Final    Comment: (NOTE) The Xpert Xpress SARS-CoV-2/FLU/RSV plus assay is intended as an aid in the diagnosis of influenza from Nasopharyngeal swab specimens and should not be used as a sole basis for treatment. Nasal washings and aspirates are unacceptable for Xpert Xpress SARS-CoV-2/FLU/RSV testing.  Fact Sheet for Patients: EntrepreneurPulse.com.au  Fact Sheet for Healthcare Providers: IncredibleEmployment.be  This test is not yet approved or cleared by the [place] FDA and has been authorized for detection and/or diagnosis of SARS-CoV-2 by FDA under an Emergency Use Authorization (EUA). This EUA will remain in effect (meaning this test can be used) for the duration of the COVID-19 declaration under Section 564(b)(1) of the Act, 21 U.S.C. section 360bbb-3(b)(1), unless the authorization is terminated or revoked.  Performed at [facility], [address] [place], [place] [address]   MRSA Next Gen by PCR, Nasal     Status: None   Collection Time: 05/26/21  5:35 PM   Specimen: Nasal Mucosa; Nasal Swab  Result Value Ref Range Status   MRSA by PCR Next Gen NOT DETECTED NOT DETECTED Final    Comment: (NOTE) The GeneXpert MRSA Assay (FDA approved for NASAL specimens only), is one component of  a comprehensive MRSA colonization surveillance program. It is not intended to diagnose MRSA infection nor to guide or monitor treatment for MRSA infections. Test performance is not FDA approved in patients less than 60 years old. Performed at [facility], [address], [place],  [address]     Labs: CBC: Recent Labs  Lab 05/26/21 0834  WBC 10.7*  NEUTROABS 7.3  HGB 15.6*  HCT 48.7*  MCV 91.0  PLT 720   Basic Metabolic Panel: Recent Labs  Lab 05/26/21 0834 05/27/21 0334  NA 138 140  K 3.9 4.0  CL 103 105  CO2 24 26  GLUCOSE 108* 111*  BUN 13 17  CREATININE 1.15* 1.23*  CALCIUM 9.3 8.7*  MG  --  2.2   Liver Function Tests: Recent Labs  Lab 05/26/21 0834  AST 27  ALT 23  ALKPHOS 98  BILITOT 0.8  PROT 7.8  ALBUMIN 4.2   CBG: No results for input(s): GLUCAP in the last 168 hours.  Discharge time spent: greater than 30 minutes.  Signed: Orson Eva, MD [facility] 05/27/2021

## 2021-06-01 DIAGNOSIS — I4891 Unspecified atrial fibrillation: Secondary | ICD-10-CM | POA: Diagnosis not present

## 2021-06-01 DIAGNOSIS — E6609 Other obesity due to excess calories: Secondary | ICD-10-CM | POA: Diagnosis not present

## 2021-06-01 DIAGNOSIS — J069 Acute upper respiratory infection, unspecified: Secondary | ICD-10-CM | POA: Diagnosis not present

## 2021-06-01 DIAGNOSIS — Z1331 Encounter for screening for depression: Secondary | ICD-10-CM | POA: Diagnosis not present

## 2021-06-09 DIAGNOSIS — L570 Actinic keratosis: Secondary | ICD-10-CM | POA: Diagnosis not present

## 2021-07-01 ENCOUNTER — Ambulatory Visit
Admission: EM | Admit: 2021-07-01 | Discharge: 2021-07-01 | Disposition: A | Discharge status: Home / Self Care | Payer: Medicare HMO | Attending: Urgent Care | Admitting: Urgent Care

## 2021-07-01 DIAGNOSIS — R319 Hematuria, unspecified: Secondary | ICD-10-CM | POA: Insufficient documentation

## 2021-07-01 DIAGNOSIS — N390 Urinary tract infection, site not specified: Secondary | ICD-10-CM | POA: Insufficient documentation

## 2021-07-01 LAB — POCT URINALYSIS DIP (MANUAL ENTRY)
Bilirubin, UA: NEGATIVE
Glucose, UA: NEGATIVE mg/dL
Nitrite, UA: NEGATIVE
Protein Ur, POC: 100 mg/dL — AB
Spec Grav, UA: >=1.03 — AB (ref 1.010–1.025)
Urobilinogen, UA: 0.2 E.U./dL
pH, UA: 5.5 (ref 5.0–8.0)

## 2021-07-01 MED ORDER — CEPHALEXIN 500 MG PO CAPS: 500.0000 mg | ORAL_CAPSULE | Freq: Four times a day (QID) | ORAL | 0 refills | Status: DC

## 2021-07-01 NOTE — ED Triage Notes (Signed)
Pt reports burning when urinating, increased urinating when urinating and chills x 1 week.  ?

## 2021-07-01 NOTE — Discharge Instructions (Addendum)
You have been diagnosed with a UTI today.  Please take your Keflex 500 mg twice daily with food.  If you develop diarrhea while taking this medication, you can please take a probiotic purchased over-the-counter, or eat yogurt with live cultures. ? ?We have sent your urine for further testing to make sure that this antibiotic choice covers the bacteria that is causing your infection.  You will hear back from [place] in 2 to 3 days if the results of the urine culture change our treatment plan.  ? ?Please follow-up with your primary care provider for a repeat urinalysis to confirm improvement of hematuria and infection in the next 2 weeks.  Please follow-up sooner if you experience fever, nausea, vomiting, flank pain, heart palpitations or any other new or worsening symptoms. ?

## 2021-07-01 NOTE — ED Provider Notes (Signed)
?[facility] ? ? ? ?CSN: [id number] ?Arrival date & time: 07/01/21  1349 ? ? ?  ? ?History   ?Chief Complaint ?Chief Complaint  ?Patient presents with  ? Dysuria  ? ? ?HPI ?Patricia Hodges is a 76 y.o. female.  ? ?Patient presents to urgent care for evaluation of dysuria, increased urination, and chills for the last week.  Patient states that she has had bladder infections throughout her life and feels as though she currently has one now.  She also reports gross hematuria that is visible and causes the toilet bowl water to be pink-tinged for the past week.  Denies recordable fever at home.  Denies nausea, vomiting, flank pain, and heart palpitations.  Reports abdominal pain that is suprapubic and achy in nature with urination.  Denies abdominal pain outside of voiding.  Does report small amount of chronic back pain specifically after working at [employer].  Denies any other aggravating or relieving symptoms. ? ? ?Dysuria ? ?Past Medical History:  ?Diagnosis Date  ? Basal cell carcinoma 04/25/2017  ? nod- left lower back (CX35FU)  ? Diverticulosis   ? Dysphagia   ? Esophageal reflux   ? Gastroenteritis, eosinophilic   ? H pylori ulcer   ? Hyperlipidemia   ? Osteoarthritis   ? S/P colonoscopy 2006  ? diverticulosis, due for repeat in 2016  ? S/P endoscopy 2007  ? Schatzki's Ring  ? SCC (squamous cell carcinoma) 07/22/2019  ? Left anterior neck- (cx87f)  ? Squamous cell carcinoma of skin 02/06/2011  ? in situ (CX35FU)  ? ? ?Patient Active Problem List  ? Diagnosis Date Noted  ? Atrial fibrillation with RVR ([facility] 05/26/2021  ? Acute bronchitis 05/26/2021  ? Hyperlipidemia 05/26/2021  ? Tendonitis, Achilles, right 07/13/2019  ? Trochanteric bursitis, left hip 03/26/2019  ? Lumbar foraminal stenosis 09/06/2016  ? Surgery, elective   ? History of colonic polyps   ? ABDOMINAL BLOATING 11/11/2009  ? HELICOBACTER PYLORI GASTRITIS 11/17/2007  ? SCHATZKI'S RING 11/17/2007  ? GERD 11/17/2007  ? HIATAL HERNIA  11/17/2007  ? EOSINOPHILIC GASTROENTERITIS 084/69/6295 ? Other spondylosis with radiculopathy, lumbar region 11/17/2007  ? WEIGHT LOSS 11/17/2007  ? NAUSEA 11/17/2007  ? VOMITING 11/17/2007  ? HEARTBURN 11/17/2007  ? OTHER DYSPHAGIA 11/17/2007  ? DIARRHEA 11/17/2007  ? ABDOMINAL PAIN, HX OF 11/17/2007  ? CHOLECYSTECTOMY, HX OF 11/17/2007  ? ? ?Past Surgical History:  ?Procedure Laterality Date  ? ANTERIOR CERVICAL DECOMP/DISCECTOMY FUSION N/A 01/30/2016  ? Procedure: C5-6 Anterior Cervical Discectomy and Fusion, Allograft, Plate;  Surgeon: MMarybelle Killings MD;  [facility]  Service: Orthopedics;  Laterality: N/A;  ? bladder tack    ? BUNIONECTOMY Right   ? CARPAL TUNNEL RELEASE Left   ? CATARACT EXTRACTION W/PHACO Right 11/28/2015  ? Procedure: CATARACT EXTRACTION PHACO AND INTRAOCULAR LENS PLACEMENT (IOC) RIGHT;  Surgeon: KTonny Branch MD;  [facility];  Service: Ophthalmology;  Laterality: Right;  CDE: 6.96  ? CATARACT EXTRACTION W/PHACO Left 12/15/2015  ? Procedure: CATARACT EXTRACTION PHACO AND INTRAOCULAR LENS PLACEMENT LEFT EYE CDE=9.04;  Surgeon: KTonny Branch MD;  Location: [facility];  Service: Ophthalmology;  Laterality: Left;  left  ? CESAREAN SECTION    ? CHOLECYSTECTOMY    ? COLONOSCOPY  2006  ? COLONOSCOPY N/A 09/08/2014  ? Procedure: COLONOSCOPY;  Surgeon: RDaneil Dolin MD;  [facility];  Service: Endoscopy;  Laterality: N/A;  10:30 AM  ? COLONOSCOPY N/A 11/20/2017  ? Procedure: COLONOSCOPY;  Surgeon: Daneil Dolin, MD;  [facility];  Service: Endoscopy;  Laterality: N/A;  10:30  ? feet surgery    ? HAND SURGERY Right   ? on ring finger due to swollen knuckle  ? Hx schatski's ring    ? POLYPECTOMY  11/20/2017  ? Procedure: POLYPECTOMY;  Surgeon: Daneil Dolin, MD;  [facility];  Service: Endoscopy;;  ascending colon (CSx1) descending colon(CSx1)  ? S/P Hysterectomy    ? ? ?OB History   ?No obstetric history on file. ?  ? ? ? ?Home Medications   ? ?Prior to Admission  medications   ?Medication Sig Start Date End Date Taking? Authorizing Provider  ?apixaban (ELIQUIS) 5 MG TABS tablet Take 1 tablet (5 mg total) by mouth 2 (two) times daily. 05/27/21   Orson Eva, MD  ?CALCIUM-VITAMIN D PO Take 1 tablet by mouth daily.    [provider]  ?diltiazem (CARDIZEM CD) 120 MG 24 hr capsule Take 1 capsule (120 mg total) by mouth daily. 05/27/21   Orson Eva, MD  ?ezetimibe (ZETIA) 10 MG tablet Take 10 mg by mouth daily. 05/15/19   [provider]  ?omeprazole (PRILOSEC OTC) 20 MG tablet Take 20 mg by mouth daily.    [provider]  ?oxyCODONE (OXY IR/ROXICODONE) 5 MG immediate release tablet Take 5 mg by mouth every 6 (six) hours as needed for moderate pain. 01/26/21   [provider]  ? ? ?Family History ?Family History  ?Problem Relation Age of Onset  ? Colon cancer Neg Hx   ? ? ?Social History ?Social History  ? ?Tobacco Use  ? Smoking status: Former  ?  Packs/day: 1.00  ?  Years: 20.00  ?  Pack years: 20.00  ?  Types: Cigarettes  ?  Quit date: 11/23/1978  ?  Years since quitting: 42.6  ? Smokeless tobacco: Never  ? Tobacco comments:  ?  quit about 30 yrs ago  ?Vaping Use  ? Vaping Use: Never used  ?Substance Use Topics  ? Alcohol use: No  ? Drug use: No  ? ? ? ?Allergies   ?Nsaids, Alendronate sodium, Aspirin, Augmentin [amoxicillin-pot clavulanate], Celecoxib, Ezetimibe-simvastatin, Rofecoxib, and Statins ? ? ?Review of Systems ?Review of Systems  ?Genitourinary:  Positive for dysuria.  ?Per HPI ? ?Physical Exam ?Triage Vital Signs ?ED Triage Vitals  ?Enc Vitals Group  ?   BP 07/01/21 1432 112/74  ?   Pulse Rate 07/01/21 1432 80  ?   Resp 07/01/21 1432 18  ?   Temp 07/01/21 1432 98.1 ?F (36.7 ?C)  ?   Temp Source 07/01/21 1432 Oral  ?   SpO2 07/01/21 1432 95 %  ?   Weight --   ?   Height --   ?   Head Circumference --   ?   Peak Flow --   ?   Pain Score 07/01/21 1431 0  ?   Pain Loc --   ?   Pain Edu? --   ?   Excl. in [place]? --   ? ?No data  found. ? ?Updated Vital Signs ?BP 112/74 (BP Location: Right Arm)   Pulse 80   Temp 98.1 ?F (36.7 ?C) (Oral)   Resp 18   SpO2 95%  ? ?Visual Acuity ?Right Eye Distance:   ?Left Eye Distance:   ?Bilateral Distance:   ? ?Right Eye Near:   ?Left Eye Near:    ?Bilateral Near:    ? ?Physical  Exam ?Vitals and nursing note reviewed.  ?Constitutional:   ?   General: She is not in acute distress. ?   Appearance: She is well-developed.  ?HENT:  ?   Head: Normocephalic and atraumatic.  ?   Right Ear: Tympanic membrane, ear canal and external ear normal.  ?   Left Ear: Tympanic membrane, ear canal and external ear normal.  ?   Nose: Nose normal.  ?   Mouth/Throat:  ?   Mouth: Mucous membranes are moist.  ?Eyes:  ?   Extraocular Movements: Extraocular movements intact.  ?   Conjunctiva/sclera: Conjunctivae normal.  ?Cardiovascular:  ?   Rate and Rhythm: Normal rate and regular rhythm.  ?   Heart sounds: Normal heart sounds. No murmur heard. ?  No friction rub. No gallop.  ?Pulmonary:  ?   Effort: Pulmonary effort is normal. No respiratory distress.  ?   Breath sounds: Normal breath sounds.  ?Abdominal:  ?   Palpations: Abdomen is soft.  ?   Tenderness: There is no abdominal tenderness. There is no right CVA tenderness or left CVA tenderness.  ?Musculoskeletal:     ?   General: No swelling.  ?   Cervical back: Neck supple.  ?Skin: ?   General: Skin is warm and dry.  ?   Capillary Refill: Capillary refill takes less than 2 seconds.  ?Neurological:  ?   General: No focal deficit present.  ?   Mental Status: She is alert and oriented to person, place, and time. Mental status is at baseline.  ?Psychiatric:     ?   Mood and Affect: Mood normal.     ?   Behavior: Behavior normal.     ?   Thought Content: Thought content normal.     ?   Judgment: Judgment normal.  ? ? ? ?UC Treatments / Results  ?Labs ?(all labs ordered are listed, but only abnormal results are displayed) ?Labs Reviewed  ?POCT URINALYSIS DIP (MANUAL ENTRY) -  Abnormal; Notable for the following components:  ?    Result Value  ? Clarity, UA hazy (*)   ? Ketones, POC UA trace (5) (*)   ? Spec Grav, UA >=1.030 (*)   ? Blood, UA large (*)   ? Protein Ur, POC =100 (*)   ? Leukocytes, UA

## 2021-07-02 LAB — URINE CULTURE: Culture: NO GROWTH

## 2021-07-08 ENCOUNTER — Ambulatory Visit (INDEPENDENT_AMBULATORY_CARE_PROVIDER_SITE_OTHER): Payer: Medicare HMO

## 2021-07-08 ENCOUNTER — Encounter: Payer: Self-pay | Admitting: Emergency Medicine

## 2021-07-08 ENCOUNTER — Other Ambulatory Visit: Payer: Self-pay

## 2021-07-08 ENCOUNTER — Ambulatory Visit
Admission: EM | Admit: 2021-07-08 | Discharge: 2021-07-08 | Disposition: A | Discharge status: Home / Self Care | Payer: Medicare HMO | Attending: Family Medicine | Admitting: Family Medicine

## 2021-07-08 DIAGNOSIS — M79641 Pain in right hand: Secondary | ICD-10-CM | POA: Diagnosis not present

## 2021-07-08 DIAGNOSIS — S60511A Abrasion of right hand, initial encounter: Secondary | ICD-10-CM | POA: Diagnosis not present

## 2021-07-08 DIAGNOSIS — Z23 Encounter for immunization: Secondary | ICD-10-CM | POA: Diagnosis not present

## 2021-07-08 DIAGNOSIS — M7989 Other specified soft tissue disorders: Secondary | ICD-10-CM | POA: Diagnosis not present

## 2021-07-08 MED ORDER — TETANUS-DIPHTH-ACELL PERTUSSIS 5-2.5-18.5 LF-MCG/0.5 IM SUSY
0.5000 mL | PREFILLED_SYRINGE | Freq: Once | INTRAMUSCULAR | Status: AC
  Administered 2021-07-08: 0.5 mL via INTRAMUSCULAR

## 2021-07-08 MED ORDER — MUPIROCIN 2 % EX OINT: 1.0000 "application " | TOPICAL_OINTMENT | Freq: Two times a day (BID) | CUTANEOUS | 0 refills | Status: DC

## 2021-07-08 MED ORDER — DOXYCYCLINE HYCLATE 100 MG PO CAPS: 100.0000 mg | ORAL_CAPSULE | Freq: Two times a day (BID) | ORAL | 0 refills | Status: DC

## 2021-07-08 MED ORDER — CHLORHEXIDINE GLUCONATE 4 % EX LIQD: Freq: Every day | CUTANEOUS | 0 refills | Status: DC | PRN

## 2021-07-08 NOTE — ED Triage Notes (Signed)
Pt reports hit right hand on rusty fence yesterday. Pt reports pain, swelling, and open abrasion ever since. Pt reports is on eliquis and reports last tetanus was "awhile ago."  ? ?No active bleeding noted. Moderate swelling to right hand/fingers. ?

## 2021-07-08 NOTE — ED Provider Notes (Signed)
?[facility] ? ? ? ?CSN: [id number] ?Arrival date & time: 07/08/21  1332 ? ? ?  ? ?History   ?Chief Complaint ?Chief Complaint  ?Patient presents with  ? Hand Problem  ? ? ?HPI ?Patricia Hodges is a 76 y.o. female.  ? ?Presenting today with injury to the right dorsal hand after she accidentally hit it against a rusty garden fence yesterday.  There is a skin tear from the incident and states swelling in the hand and second and third finger.  Stiffness and soreness diffusely in the hand but no numbness, tingling, fever, chills, drainage.  Is currently on anticoagulation daily but states bleeding has been well controlled with pressure.  Clean the area with peroxide after the accident.  Unsure when last tetanus was, thinks it was at least 10 years ago if not longer. ? ? ?Past Medical History:  ?Diagnosis Date  ? Basal cell carcinoma 04/25/2017  ? nod- left lower back (CX35FU)  ? Diverticulosis   ? Dysphagia   ? Esophageal reflux   ? Gastroenteritis, eosinophilic   ? H pylori ulcer   ? Hyperlipidemia   ? Osteoarthritis   ? S/P colonoscopy 2006  ? diverticulosis, due for repeat in 2016  ? S/P endoscopy 2007  ? Schatzki's Ring  ? SCC (squamous cell carcinoma) 07/22/2019  ? Left anterior neck- (cx11f)  ? Squamous cell carcinoma of skin 02/06/2011  ? in situ (CX35FU)  ? ? ?Patient Active Problem List  ? Diagnosis Date Noted  ? Atrial fibrillation with RVR ([place] 05/26/2021  ? Acute bronchitis 05/26/2021  ? Hyperlipidemia 05/26/2021  ? Tendonitis, Achilles, right 07/13/2019  ? Trochanteric bursitis, left hip 03/26/2019  ? Lumbar foraminal stenosis 09/06/2016  ? Surgery, elective   ? History of colonic polyps   ? ABDOMINAL BLOATING 11/11/2009  ? HELICOBACTER PYLORI GASTRITIS 11/17/2007  ? SCHATZKI'S RING 11/17/2007  ? GERD 11/17/2007  ? HIATAL HERNIA 11/17/2007  ? EOSINOPHILIC GASTROENTERITIS 026/83/4196 ? Other spondylosis with radiculopathy, lumbar region 11/17/2007  ? WEIGHT LOSS 11/17/2007  ? NAUSEA  11/17/2007  ? VOMITING 11/17/2007  ? HEARTBURN 11/17/2007  ? OTHER DYSPHAGIA 11/17/2007  ? DIARRHEA 11/17/2007  ? ABDOMINAL PAIN, HX OF 11/17/2007  ? CHOLECYSTECTOMY, HX OF 11/17/2007  ? ? ?Past Surgical History:  ?Procedure Laterality Date  ? ANTERIOR CERVICAL DECOMP/DISCECTOMY FUSION N/A 01/30/2016  ? Procedure: C5-6 Anterior Cervical Discectomy and Fusion, Allograft, Plate;  Surgeon: MMarybelle Killings MD;  Location: [facility]  Service: Orthopedics;  Laterality: N/A;  ? bladder tack    ? BUNIONECTOMY Right   ? CARPAL TUNNEL RELEASE Left   ? CATARACT EXTRACTION W/PHACO Right 11/28/2015  ? Procedure: CATARACT EXTRACTION PHACO AND INTRAOCULAR LENS PLACEMENT (IOC) RIGHT;  Surgeon: KTonny Branch MD;  [facility];  Service: Ophthalmology;  Laterality: Right;  CDE: 6.96  ? CATARACT EXTRACTION W/PHACO Left 12/15/2015  ? Procedure: CATARACT EXTRACTION PHACO AND INTRAOCULAR LENS PLACEMENT LEFT EYE CDE=9.04;  Surgeon: KTonny Branch MD;  Location: [facility];  Service: Ophthalmology;  Laterality: Left;  left  ? CESAREAN SECTION    ? CHOLECYSTECTOMY    ? COLONOSCOPY  2006  ? COLONOSCOPY N/A 09/08/2014  ? Procedure: COLONOSCOPY;  Surgeon: RDaneil Dolin MD;  [facility];  Service: Endoscopy;  Laterality: N/A;  10:30 AM  ? COLONOSCOPY N/A 11/20/2017  ? Procedure: COLONOSCOPY;  Surgeon: RDaneil Dolin MD;  [facility];  Service: Endoscopy;  Laterality: N/A;  10:30  ? feet surgery    ?  HAND SURGERY Right   ? on ring finger due to swollen knuckle  ? Hx schatski's ring    ? POLYPECTOMY  11/20/2017  ? Procedure: POLYPECTOMY;  Surgeon: Daneil Dolin, MD;  [facility];  Service: Endoscopy;;  ascending colon (CSx1) descending colon(CSx1)  ? S/P Hysterectomy    ? ? ?OB History   ?No obstetric history on file. ?  ? ? ? ?Home Medications   ? ?Prior to Admission medications   ?Medication Sig Start Date End Date Taking? Authorizing Provider  ?chlorhexidine (HIBICLENS) 4 % external liquid Apply topically daily as  needed. 07/08/21  Yes Volney American, PA-C  ?doxycycline (VIBRAMYCIN) 100 MG capsule Take 1 capsule (100 mg total) by mouth 2 (two) times daily. 07/08/21  Yes Volney American, PA-C  ?mupirocin ointment (BACTROBAN) 2 % Apply 1 application. topically 2 (two) times daily. 07/08/21  Yes Volney American, PA-C  ?apixaban (ELIQUIS) 5 MG TABS tablet Take 1 tablet (5 mg total) by mouth 2 (two) times daily. 05/27/21   Orson Eva, MD  ?CALCIUM-VITAMIN D PO Take 1 tablet by mouth daily.    [provider]  ?cephALEXin (KEFLEX) 500 MG capsule Take 1 capsule (500 mg total) by mouth 4 (four) times daily. 07/01/21   Talbot Grumbling, FNP  ?diltiazem (CARDIZEM CD) 120 MG 24 hr capsule Take 1 capsule (120 mg total) by mouth daily. 05/27/21   Orson Eva, MD  ?ezetimibe (ZETIA) 10 MG tablet Take 10 mg by mouth daily. 05/15/19   [provider]  ?omeprazole (PRILOSEC OTC) 20 MG tablet Take 20 mg by mouth daily.    [provider]  ?oxyCODONE (OXY IR/ROXICODONE) 5 MG immediate release tablet Take 5 mg by mouth every 6 (six) hours as needed for moderate pain. 01/26/21   [provider]  ? ? ?Family History ?Family History  ?Problem Relation Age of Onset  ? Colon cancer Neg Hx   ? ? ?Social History ?Social History  ? ?Tobacco Use  ? Smoking status: Former  ?  Packs/day: 1.00  ?  Years: 20.00  ?  Pack years: 20.00  ?  Types: Cigarettes  ?  Quit date: 11/23/1978  ?  Years since quitting: 42.6  ? Smokeless tobacco: Never  ? Tobacco comments:  ?  quit about 30 yrs ago  ?Vaping Use  ? Vaping Use: Never used  ?Substance Use Topics  ? Alcohol use: No  ? Drug use: No  ? ? ? ?Allergies   ?Nsaids, Alendronate sodium, Aspirin, Augmentin [amoxicillin-pot clavulanate], Celecoxib, Ezetimibe-simvastatin, Rofecoxib, and Statins ? ? ?Review of Systems ?Review of Systems ?Per HPI ? ?Physical Exam ?Triage Vital Signs ?ED Triage Vitals  ?Enc Vitals Group  ?   BP 07/08/21 1412 138/71  ?   Pulse Rate  07/08/21 1412 70  ?   Resp 07/08/21 1412 18  ?   Temp 07/08/21 1412 98.7 ?F (37.1 ?C)  ?   Temp Source 07/08/21 1412 Oral  ?   SpO2 07/08/21 1412 95 %  ?   Weight 07/08/21 1413 182 lb (82.6 kg)  ?   Height 07/08/21 1413 '5\' 4"'$  (1.626 m)  ?   Head Circumference --   ?   Peak Flow --   ?   Pain Score 07/08/21 1413 8  ?   Pain Loc --   ?   Pain Edu? --   ?   Excl. in [place]? --   ? ?No data found. ? ?Updated  Vital Signs ?BP 138/71 (BP Location: Right Arm)   Pulse 70   Temp 98.7 ?F (37.1 ?C) (Oral)   Resp 18   Ht '5\' 4"'$  (1.626 m)   Wt 182 lb (82.6 kg)   SpO2 95%   BMI 31.24 kg/m?  ? ?Visual Acuity ?Right Eye Distance:   ?Left Eye Distance:   ?Bilateral Distance:   ? ?Right Eye Near:   ?Left Eye Near:    ?Bilateral Near:    ? ?Physical Exam ?Vitals and nursing note reviewed.  ?Constitutional:   ?   Appearance: Normal appearance. She is not ill-appearing.  ?HENT:  ?   Head: Atraumatic.  ?   Mouth/Throat:  ?   Mouth: Mucous membranes are moist.  ?Eyes:  ?   Extraocular Movements: Extraocular movements intact.  ?   Conjunctiva/sclera: Conjunctivae normal.  ?Cardiovascular:  ?   Rate and Rhythm: Normal rate and regular rhythm.  ?   Heart sounds: Normal heart sounds.  ?Pulmonary:  ?   Effort: Pulmonary effort is normal.  ?   Breath sounds: Normal breath sounds.  ?Musculoskeletal:     ?   General: Swelling, tenderness and signs of injury present. No deformity. Normal range of motion.  ?   Cervical back: Normal range of motion and neck supple.  ?   Comments: No bony deformities palpable on exam, range of motion mildly decreased right second and third fingers due to swelling and underlying arthritis.  ?Skin: ?   General: Skin is warm.  ?   Comments: Superficial abrasion to dorsal surface of right hand with diffuse edema  ?Neurological:  ?   Mental Status: She is alert and oriented to person, place, and time.  ?   Motor: No weakness.  ?   Gait: Gait normal.  ?   Comments: Right hand neurovascularly intact  ?Psychiatric:     ?    Mood and Affect: Mood normal.     ?   Thought Content: Thought content normal.     ?   Judgment: Judgment normal.  ? ?UC Treatments / Results  ?Labs ?(all labs ordered are listed, but only abnormal results are displayed) ?Labs

## 2021-07-08 NOTE — ED Notes (Signed)
Bacitracin ointment applied to site, non-adherent pad applied, secured with conform dressing. Pt tolerated well.  ? ?Infection prevention, site management reviewed, as well as discharge medications.  ?

## 2021-07-12 DIAGNOSIS — I739 Peripheral vascular disease, unspecified: Secondary | ICD-10-CM | POA: Diagnosis not present

## 2021-07-12 DIAGNOSIS — R030 Elevated blood-pressure reading, without diagnosis of hypertension: Secondary | ICD-10-CM | POA: Diagnosis not present

## 2021-07-12 DIAGNOSIS — M1990 Unspecified osteoarthritis, unspecified site: Secondary | ICD-10-CM | POA: Diagnosis not present

## 2021-07-12 DIAGNOSIS — D6869 Other thrombophilia: Secondary | ICD-10-CM | POA: Diagnosis not present

## 2021-07-12 DIAGNOSIS — K219 Gastro-esophageal reflux disease without esophagitis: Secondary | ICD-10-CM | POA: Diagnosis not present

## 2021-07-12 DIAGNOSIS — Z7901 Long term (current) use of anticoagulants: Secondary | ICD-10-CM | POA: Diagnosis not present

## 2021-07-12 DIAGNOSIS — E785 Hyperlipidemia, unspecified: Secondary | ICD-10-CM | POA: Diagnosis not present

## 2021-07-12 DIAGNOSIS — R32 Unspecified urinary incontinence: Secondary | ICD-10-CM | POA: Diagnosis not present

## 2021-07-12 DIAGNOSIS — Z008 Encounter for other general examination: Secondary | ICD-10-CM | POA: Diagnosis not present

## 2021-07-12 DIAGNOSIS — Z6832 Body mass index (BMI) 32.0-32.9, adult: Secondary | ICD-10-CM | POA: Diagnosis not present

## 2021-07-12 DIAGNOSIS — I4891 Unspecified atrial fibrillation: Secondary | ICD-10-CM | POA: Diagnosis not present

## 2021-07-12 DIAGNOSIS — E669 Obesity, unspecified: Secondary | ICD-10-CM | POA: Diagnosis not present

## 2021-07-12 DIAGNOSIS — Z809 Family history of malignant neoplasm, unspecified: Secondary | ICD-10-CM | POA: Diagnosis not present

## 2021-07-13 ENCOUNTER — Other Ambulatory Visit (HOSPITAL_COMMUNITY): Payer: Self-pay | Admitting: Family Medicine

## 2021-07-13 DIAGNOSIS — Z1231 Encounter for screening mammogram for malignant neoplasm of breast: Secondary | ICD-10-CM

## 2021-07-24 ENCOUNTER — Ambulatory Visit (HOSPITAL_COMMUNITY)
Admission: RE | Admit: 2021-07-24 | Discharge: 2021-07-24 | Disposition: A | Discharge status: Home / Self Care | Payer: Medicare HMO | Source: Ambulatory Visit | Attending: Family Medicine | Admitting: Family Medicine

## 2021-07-24 DIAGNOSIS — Z1231 Encounter for screening mammogram for malignant neoplasm of breast: Secondary | ICD-10-CM | POA: Insufficient documentation

## 2021-07-26 ENCOUNTER — Telehealth: Payer: Self-pay | Admitting: Physician Assistant

## 2021-07-26 MED ORDER — APIXABAN 5 MG PO TABS: 5.0000 mg | ORAL_TABLET | Freq: Two times a day (BID) | ORAL | 1 refills | Status: DC

## 2021-07-26 MED ORDER — DILTIAZEM HCL ER COATED BEADS 120 MG PO CP24: 120.0000 mg | ORAL_CAPSULE | Freq: Every day | ORAL | 1 refills | Status: DC

## 2021-07-26 NOTE — Telephone Encounter (Signed)
?  Patient is scheduled to see Rosaria Ferries in a couple of weeks.   ? ? ?1. Which medications need to be refilled? (please list name of each medication and dose if known) Diltiazem ER '120mg'$ /24H cap #30 Take 1 capsule poqd and Eliquis '5mg'$  #60 Take 1 tablet po bid  ? ?2. Which pharmacy/location (including street and city if local pharmacy) is medication to be sent to? [facility] in [place]  ? ?3. Do they need a 30 day or 90 day supply? 30 day ?

## 2021-07-26 NOTE — Telephone Encounter (Signed)
Prescription refill request for Eliquis received. ?Indication: Atrial Fib ?Last office visit:  S/P hospital admission.  Next OV 08/11/21 with R Barrett PA-C ?Scr: 1.23 on 05/27/21 ?Age: 76 ?Weight: 82.6kg ? ?Based on above findings Eliquis '5mg'$  twice daily is the appropriate dose.  OK to refill med per Dr Myles Gip. ? ?

## 2021-07-26 NOTE — Telephone Encounter (Signed)
Okay to r/f Eliquis per Dr. Domenic Polite ?

## 2021-08-01 DIAGNOSIS — Z1331 Encounter for screening for depression: Secondary | ICD-10-CM | POA: Diagnosis not present

## 2021-08-01 DIAGNOSIS — Z0001 Encounter for general adult medical examination with abnormal findings: Secondary | ICD-10-CM | POA: Diagnosis not present

## 2021-08-01 DIAGNOSIS — F419 Anxiety disorder, unspecified: Secondary | ICD-10-CM | POA: Diagnosis not present

## 2021-08-01 DIAGNOSIS — M810 Age-related osteoporosis without current pathological fracture: Secondary | ICD-10-CM | POA: Diagnosis not present

## 2021-08-01 DIAGNOSIS — E785 Hyperlipidemia, unspecified: Secondary | ICD-10-CM | POA: Diagnosis not present

## 2021-08-01 DIAGNOSIS — R69 Illness, unspecified: Secondary | ICD-10-CM | POA: Diagnosis not present

## 2021-08-01 DIAGNOSIS — E6609 Other obesity due to excess calories: Secondary | ICD-10-CM | POA: Diagnosis not present

## 2021-08-01 DIAGNOSIS — Z6831 Body mass index (BMI) 31.0-31.9, adult: Secondary | ICD-10-CM | POA: Diagnosis not present

## 2021-08-01 DIAGNOSIS — I4891 Unspecified atrial fibrillation: Secondary | ICD-10-CM | POA: Diagnosis not present

## 2021-08-11 ENCOUNTER — Encounter: Payer: Self-pay | Admitting: *Deleted

## 2021-08-11 ENCOUNTER — Ambulatory Visit: Payer: Medicare HMO | Admitting: Physician Assistant

## 2021-08-11 ENCOUNTER — Encounter: Payer: Self-pay | Admitting: Physician Assistant

## 2021-08-11 VITALS — BP 114/70 | HR 94 | Ht 64.0 in | Wt 190.8 lb

## 2021-08-11 DIAGNOSIS — I4819 Other persistent atrial fibrillation: Secondary | ICD-10-CM

## 2021-08-11 DIAGNOSIS — Z7901 Long term (current) use of anticoagulants: Secondary | ICD-10-CM | POA: Diagnosis not present

## 2021-08-11 NOTE — Patient Instructions (Signed)
Medication Instructions:  Your physician recommends that you continue on your current medications as directed. Please refer to the Current Medication list given to you today.  *If you need a refill on your cardiac medications before your next appointment, please call your pharmacy*   Lab Work: Your physician recommends that you return for lab work the day of your Pre op.   If you have labs (blood work) drawn today and your tests are completely normal, you will receive your results only by: [place] (if you have MyChart) OR A paper copy in the mail If you have any lab test that is abnormal or we need to change your treatment, we will call you to review the results.   Testing/Procedures: Your physician has recommended that you have a Cardioversion (DCCV). Electrical Cardioversion uses a jolt of electricity to your heart either through paddles or wired patches attached to your chest. This is a controlled, usually prescheduled, procedure. Defibrillation is done under light anesthesia in the hospital, and you usually go home the day of the procedure. This is done to get your heart back into a normal rhythm. You are not awake for the procedure. Please see the instruction sheet given to you today.    Follow-Up: At [facility], you and your health needs are our priority.  As part of our continuing mission to provide you with exceptional heart care, we have created designated Provider Care Teams.  These Care Teams include your primary Cardiologist (physician) and Advanced Practice Providers (APPs -  Physician Assistants and Nurse Practitioners) who all work together to provide you with the care you need, when you need it.  We recommend signing up for the patient portal called "MyChart".  Sign up information is provided on this After Visit Summary.  MyChart is used to connect with patients for Virtual Visits (Telemedicine).  Patients are able to view lab/test results, encounter notes, upcoming  appointments, etc.  Non-urgent messages can be sent to your provider as well.   To learn more about what you can do with MyChart, go to [email or web address].    Your next appointment:    After cardioversion   The format for your next appointment:   In Person  Provider:   Rozann Lesches, MD or Bernerd Pho, PA-C    Other Instructions Thank you for choosing [facility]!    Important Information About [place]

## 2021-08-11 NOTE — Progress Notes (Signed)
Cardiology Office Note   Date:  08/11/2021   ID:  Sedalia, Greeson 1945/09/08, MRN [id number]  PCP:  Sharilyn Sites, MD Cardiologist:  Rozann Lesches, MD 05/26/2021 in-hosp Electrphysiologist: None Rosaria Ferries, PA-C   No chief complaint on file.   History of Present Illness: Patricia Hodges is a 76 y.o. female with a history of HLD, GERD w/ schatzki's ring, OA, Afib  05/2021 admission for Afib, seen by Cards, started on Cardizem/Eliquis  Patricia Hodges presents for cardiology follow-up  She has been struggling a bit lately.  She has problems on a daily basis with being lightheaded and dizzy.  The symptoms seem to be made worse by exertion.  She feels staggery at times when she tries to walk up stairs.  She has not lost consciousness or fallen, but the symptoms have caused her to limit activity.  She has not had any significant chest pain, sometimes gets a twinge that goes away immediately.  She has not had lower extremity edema, no orthopnea or PND.  She does not feel the atrial fibrillation at all.  She has no idea when she went in it, the dizziness and other symptoms started several months ago.  She is confident that she has not missed any doses of her Eliquis.   Past Medical History:  Diagnosis Date   Basal cell carcinoma 04/25/2017   nod- left lower back (CX35FU)   Diverticulosis    Dysphagia    Esophageal reflux    Gastroenteritis, eosinophilic    H pylori ulcer    Hyperlipidemia    Osteoarthritis    S/P colonoscopy 2006   diverticulosis, due for repeat in 2016   S/P endoscopy 2007   Schatzki's Ring   SCC (squamous cell carcinoma) 07/22/2019   Left anterior neck- (cx67f)   Squamous cell carcinoma of skin 02/06/2011   in situ (CX35FU)    Past Surgical History:  Procedure Laterality Date   ANTERIOR CERVICAL DECOMP/DISCECTOMY FUSION N/A 01/30/2016   Procedure: C5-6 Anterior Cervical Discectomy and Fusion, Allograft, Plate;   Surgeon: MMarybelle Killings MD;  [facility] [facility]  Service: Orthopedics;  Laterality: N/A;   bladder tack     BUNIONECTOMY Right    CARPAL TUNNEL RELEASE Left    CATARACT EXTRACTION W/PHACO Right 11/28/2015   Procedure: CATARACT EXTRACTION PHACO AND INTRAOCULAR LENS PLACEMENT (IOC) RIGHT;  Surgeon: KTonny Branch MD;  Location[facility];  Service: Ophthalmology;  Laterality: Right;  CDE: 6.96   CATARACT EXTRACTION W/PHACO Left 12/15/2015   Procedure: CATARACT EXTRACTION PHACO AND INTRAOCULAR LENS PLACEMENT LEFT EYE CDE=9.04;  Surgeon: KTonny Branch MD;  Location[facility];  Service: Ophthalmology;  Laterality: Left;  left   CESAREAN SECTION     CHOLECYSTECTOMY     COLONOSCOPY  2006   COLONOSCOPY N/A 09/08/2014   Procedure: COLONOSCOPY;  Surgeon: RDaneil Dolin MD;  [facility];  Service: Endoscopy;  Laterality: N/A;  10:30 AM   COLONOSCOPY N/A 11/20/2017   Procedure: COLONOSCOPY;  Surgeon: RDaneil Dolin MD;  [facility];  Service: Endoscopy;  Laterality: N/A;  10:30   feet surgery     HAND SURGERY Right    on ring finger due to swollen knuckle   Hx schatski's ring     POLYPECTOMY  11/20/2017   Procedure: POLYPECTOMY;  Surgeon: RDaneil Dolin MD;  [facility];  Service: Endoscopy;;  ascending colon (CSx1) descending colon(CSx1)   S/P Hysterectomy  Current Outpatient Medications  Medication Sig Dispense Refill   apixaban (ELIQUIS) 5 MG TABS tablet Take 1 tablet (5 mg total) by mouth 2 (two) times daily. 60 tablet 1   ciprofloxacin (CIPRO) 500 MG tablet SMARTSIG:1 Tablet(s) By Mouth Every 12 Hours     diltiazem (CARDIZEM CD) 120 MG 24 hr capsule Take 1 capsule (120 mg total) by mouth daily. 90 capsule 1   ezetimibe (ZETIA) 10 MG tablet Take 10 mg by mouth daily.     levothyroxine (SYNTHROID) 50 MCG tablet Take 50 mcg by mouth daily.     omeprazole (PRILOSEC OTC) 20 MG tablet Take 20 mg by mouth daily.     oxyCODONE (OXY IR/ROXICODONE) 5 MG immediate release  tablet Take 5 mg by mouth every 6 (six) hours as needed for moderate pain.     CALCIUM-VITAMIN D PO Take 1 tablet by mouth daily. (Patient not taking: Reported on 08/11/2021)     cephALEXin (KEFLEX) 500 MG capsule Take 1 capsule (500 mg total) by mouth 4 (four) times daily. (Patient not taking: Reported on 08/11/2021) 20 capsule 0   chlorhexidine (HIBICLENS) 4 % external liquid Apply topically daily as needed. (Patient not taking: Reported on 08/11/2021) 120 mL 0   doxycycline (VIBRAMYCIN) 100 MG capsule Take 1 capsule (100 mg total) by mouth 2 (two) times daily. (Patient not taking: Reported on 08/11/2021) 14 capsule 0   mupirocin ointment (BACTROBAN) 2 % Apply 1 application. topically 2 (two) times daily. (Patient not taking: Reported on 08/11/2021) 22 g 0   No current facility-administered medications for this visit.    Allergies:   Nsaids, Alendronate sodium, Aspirin, Augmentin [amoxicillin-pot clavulanate], Celecoxib, Ezetimibe-simvastatin, Rofecoxib, and Statins    Social History:  The patient  reports that she quit smoking about 42 years ago. Her smoking use included cigarettes. She has a 20.00 pack-year smoking history. She has never used smokeless tobacco. She reports that she does not drink alcohol and does not use drugs.   Family History:  The patient's family history is not on file.  She indicated that her mother is deceased. She indicated that her father is deceased. She indicated that her sister is deceased. She indicated that her brother is deceased. She indicated that the status of her neg hx is unknown.   ROS:  Please see the history of present illness. All other systems are reviewed and negative.    PHYSICAL EXAM: VS:  BP 114/70   Pulse 94   Ht '5\' 4"'$  (1.626 m)   Wt 190 lb 12.8 oz (86.5 kg)   SpO2 96%   BMI 32.75 kg/m  , BMI Body mass index is 32.75 kg/m. GEN: Well nourished, well developed, female in no acute distress HEENT: normal for age  Neck: no JVD, no carotid  bruit, no masses Cardiac: Irregular rate and rhythm; no murmur, no rubs, or gallops Respiratory:  clear to auscultation bilaterally, normal work of breathing GI: soft, nontender, nondistended, + BS MS: no deformity or atrophy; no edema; distal pulses are 2+ in all 4 extremities  Skin: warm and dry, no rash Neuro:  Strength and sensation are intact Psych: euthymic mood, full affect   EKG:  EKG is ordered today. The ekg ordered today demonstrates atrial fibrillation, heart rate 72, no acute ischemic changes, no significant pauses  ECHO:  05/26/2021  1. Left ventricular ejection fraction, by estimation, is 60 to 65%. The  left ventricle has normal function. The left ventricle has no regional  wall motion abnormalities.  There is mild left ventricular hypertrophy.  Left ventricular diastolic parameters  are indeterminate.   2. Right ventricular systolic function is normal. The right ventricular  size is normal. There is normal pulmonary artery systolic pressure. The  estimated right ventricular systolic pressure is 02.7 mmHg.   3. Left atrial size was upper normal.   4. The mitral valve is grossly normal. Trivial mitral valve  regurgitation.   5. The aortic valve is tricuspid. Aortic valve regurgitation is not  visualized. Aortic valve sclerosis is present, with no evidence of aortic  valve stenosis. Aortic valve mean gradient measures 2.0 mmHg.   6. The inferior vena cava is normal in size with greater than 50%  respiratory variability, suggesting right atrial pressure of 3 mmHg.    Recent Labs: 05/26/2021: ALT 23; Hemoglobin 15.6; Platelets 246; TSH 3.946 05/27/2021: BUN 17; Creatinine, Ser 1.23; Magnesium 2.2; Potassium 4.0; Sodium 140  CBC    Component Value Date/Time   WBC 10.7 (H) 05/26/2021 0834   RBC 5.35 (H) 05/26/2021 0834   HGB 15.6 (H) 05/26/2021 0834   HCT 48.7 (H) 05/26/2021 0834   PLT 246 05/26/2021 0834   MCV 91.0 05/26/2021 0834   MCH 29.2 05/26/2021 0834   MCHC  32.0 05/26/2021 0834   RDW 12.6 05/26/2021 0834   LYMPHSABS 1.9 05/26/2021 0834   MONOABS 0.8 05/26/2021 0834   EOSABS 0.5 05/26/2021 0834   BASOSABS 0.1 05/26/2021 0834      Latest Ref Rng & Units 05/27/2021    3:34 AM 05/26/2021    8:34 AM 01/12/2019    5:05 PM  CMP  Glucose 70 - 99 mg/dL 111   108   110    BUN 8 - 23 mg/dL '17   13   17    '$ Creatinine 0.44 - 1.00 mg/dL 1.23   1.15   0.75    Sodium 135 - 145 mmol/L 140   138   137    Potassium 3.5 - 5.1 mmol/L 4.0   3.9   4.0    Chloride 98 - 111 mmol/L 105   103   104    CO2 22 - 32 mmol/L '26   24   25    '$ Calcium 8.9 - 10.3 mg/dL 8.7   9.3   8.6    Total Protein 6.5 - 8.1 g/dL  7.8   7.4    Total Bilirubin 0.3 - 1.2 mg/dL  0.8   0.5    Alkaline Phos 38 - 126 U/L  98   130    AST 15 - 41 U/L  27   62    ALT 0 - 44 U/L  23   78       Lipid Panel No results found for: CHOL, HDL, LDLCALC, LDLDIRECT, TRIG, CHOLHDL    Wt Readings from Last 3 Encounters:  08/11/21 190 lb 12.8 oz (86.5 kg)  07/08/21 182 lb (82.6 kg)  05/27/21 187 lb 2.7 oz (84.9 kg)     Other studies Reviewed: Additional studies/ records that were reviewed today include: Office notes, hospital records and testing.  ASSESSMENT AND PLAN:  1.  Persistent atrial fibrillation - Her heart rate is reasonably well controlled on Cardizem CD 120 mg daily, but she continues to have symptoms - It is not 100% clear that the symptoms are related to the atrial fibrillation, but the likelihood is high - She has been compliant with the with the Eliquis, no doses missed - The risks and  benefits of cardioversion were discussed with the patient and her son in the room. -They are agreeable to pursuing this, we will schedule it at the first opportunity  2.  Chronic anticoagulation: - She is on Eliquis 5 mg daily, no doses missed -  This patients CHA2DS2-VASc Score and unadjusted Ischemic Stroke Rate (% per year) is equal to 4.8 % stroke rate/year from a score of 4  Above  score calculated as 1 point each if present [CHF, HTN, DM, Vascular=MI/PAD/Aortic Plaque, Age if 65-74, or Female] Above score calculated as 2 points each if present [Age > 75, or Stroke/TIA/TE]  3.  Hypertension: - Her blood pressure is well controlled on the diltiazem CD1 120 mg daily, continue this  Current medicines are reviewed at length with the patient today.  The patient does not have concerns regarding medicines.  The following changes have been made:  no change  Labs/ tests ordered today include:   Orders Placed This Encounter  Procedures   EKG 12-Lead     Disposition:   FU with Rozann Lesches, MD  Signed, Rosaria Ferries, PA-C  08/11/2021 3:38 PM    [facility] Phone: [phone]; Fax: [phone]

## 2021-08-15 ENCOUNTER — Telehealth: Payer: Self-pay | Admitting: *Deleted

## 2021-08-15 NOTE — Telephone Encounter (Signed)
Called pt to notify of date and time of cardioversion on Thursday, June 1 at 1:00 pm. Pt is to arrive at 10:30 am.

## 2021-08-16 ENCOUNTER — Encounter (HOSPITAL_COMMUNITY)
Admission: RE | Admit: 2021-08-16 | Discharge: 2021-08-16 | Disposition: A | Discharge status: Home / Self Care | Payer: Medicare HMO | Source: Ambulatory Visit | Attending: Cardiology | Admitting: Cardiology

## 2021-08-17 ENCOUNTER — Ambulatory Visit (HOSPITAL_COMMUNITY): Payer: Medicare HMO | Admitting: Certified Registered"

## 2021-08-17 ENCOUNTER — Ambulatory Visit (HOSPITAL_BASED_OUTPATIENT_CLINIC_OR_DEPARTMENT_OTHER): Payer: Medicare HMO | Admitting: Certified Registered"

## 2021-08-17 ENCOUNTER — Other Ambulatory Visit: Payer: Self-pay

## 2021-08-17 ENCOUNTER — Encounter (HOSPITAL_COMMUNITY): Payer: Self-pay | Admitting: Cardiology

## 2021-08-17 ENCOUNTER — Encounter (HOSPITAL_COMMUNITY)
Admission: RE | Disposition: A | Discharge status: Home / Self Care | Payer: Self-pay | Source: Home / Self Care | Attending: Cardiology

## 2021-08-17 ENCOUNTER — Ambulatory Visit (HOSPITAL_COMMUNITY)
Admission: RE | Admit: 2021-08-17 | Discharge: 2021-08-17 | Disposition: A | Discharge status: Home / Self Care | Payer: Medicare HMO | Attending: Cardiology | Admitting: Cardiology

## 2021-08-17 DIAGNOSIS — Z7901 Long term (current) use of anticoagulants: Secondary | ICD-10-CM | POA: Diagnosis not present

## 2021-08-17 DIAGNOSIS — G709 Myoneural disorder, unspecified: Secondary | ICD-10-CM | POA: Diagnosis not present

## 2021-08-17 DIAGNOSIS — I4819 Other persistent atrial fibrillation: Secondary | ICD-10-CM | POA: Insufficient documentation

## 2021-08-17 DIAGNOSIS — Z87891 Personal history of nicotine dependence: Secondary | ICD-10-CM | POA: Diagnosis not present

## 2021-08-17 DIAGNOSIS — K219 Gastro-esophageal reflux disease without esophagitis: Secondary | ICD-10-CM | POA: Insufficient documentation

## 2021-08-17 DIAGNOSIS — K222 Esophageal obstruction: Secondary | ICD-10-CM | POA: Insufficient documentation

## 2021-08-17 DIAGNOSIS — M1990 Unspecified osteoarthritis, unspecified site: Secondary | ICD-10-CM | POA: Insufficient documentation

## 2021-08-17 DIAGNOSIS — K279 Peptic ulcer, site unspecified, unspecified as acute or chronic, without hemorrhage or perforation: Secondary | ICD-10-CM

## 2021-08-17 DIAGNOSIS — Z79899 Other long term (current) drug therapy: Secondary | ICD-10-CM | POA: Insufficient documentation

## 2021-08-17 DIAGNOSIS — I10 Essential (primary) hypertension: Secondary | ICD-10-CM | POA: Insufficient documentation

## 2021-08-17 DIAGNOSIS — E785 Hyperlipidemia, unspecified: Secondary | ICD-10-CM | POA: Diagnosis not present

## 2021-08-17 HISTORY — PX: CARDIOVERSION: SHX1299

## 2021-08-17 SURGERY — CARDIOVERSION
Anesthesia: General

## 2021-08-17 MED ORDER — SODIUM CHLORIDE 0.9 % IV SOLN: INTRAVENOUS | Status: DC

## 2021-08-17 MED ORDER — LIDOCAINE HCL (PF) 2 % IJ SOLN
INTRAMUSCULAR | Status: AC
  Filled 2021-08-17: qty 5

## 2021-08-17 MED ORDER — LACTATED RINGERS IV SOLN: INTRAVENOUS | Status: DC | PRN

## 2021-08-17 MED ORDER — ORAL CARE MOUTH RINSE: 15.0000 mL | Freq: Once | OROMUCOSAL | Status: DC

## 2021-08-17 MED ORDER — PROPOFOL 10 MG/ML IV BOLUS
INTRAVENOUS | Status: AC
  Filled 2021-08-17: qty 20

## 2021-08-17 MED ORDER — LACTATED RINGERS IV SOLN: INTRAVENOUS | Status: DC

## 2021-08-17 MED ORDER — LIDOCAINE 2% (20 MG/ML) 5 ML SYRINGE
INTRAMUSCULAR | Status: DC | PRN
  Administered 2021-08-17: 80 mg via INTRAVENOUS

## 2021-08-17 MED ORDER — ATROPINE SULFATE 0.4 MG/ML IV SOLN
INTRAVENOUS | Status: AC
  Filled 2021-08-17: qty 1

## 2021-08-17 MED ORDER — PROPOFOL 10 MG/ML IV BOLUS
INTRAVENOUS | Status: DC | PRN
  Administered 2021-08-17: 80 mg via INTRAVENOUS

## 2021-08-17 MED ORDER — CHLORHEXIDINE GLUCONATE 0.12 % MT SOLN: 15.0000 mL | Freq: Once | OROMUCOSAL | Status: DC

## 2021-08-17 NOTE — CV Procedure (Signed)
CV Procedure Note  Procedure: Electrical cardioversion Physician: Dr Carlyle Dolly MD Indication: Persistent atrial fibrillation   Patient was brought to the procedure suite after appropriate consent was obtained. I confirmed with patient she had not missed any eliquis doses within the last 3 weeks. Defib pads were placed in the anterior and posterior positions. Sedation was achieved with the assistance of anesthesiology, please see there records for details. She was succesfully cardioverted with a single synchronized 200j shock from afib  to normal sinus rhythm. Cardiopulmonary monitoring was performed throughout the procedure, she tolerated well without complications   Carlyle Dolly MD

## 2021-08-17 NOTE — Anesthesia Preprocedure Evaluation (Signed)
Anesthesia Evaluation  Patient identified by MRN, date of birth, ID band Patient awake    Reviewed: Allergy & Precautions, H&P , NPO status , Patient's Chart, lab work & pertinent test results, reviewed documented beta blocker date and time   Airway Mallampati: II  TM Distance: >3 FB Neck ROM: full    Dental no notable dental hx.    Pulmonary neg pulmonary ROS, former smoker,    Pulmonary exam normal breath sounds clear to auscultation       Cardiovascular Exercise Tolerance: Good + dysrhythmias Atrial Fibrillation  Rhythm:irregular Rate:Normal     Neuro/Psych  Neuromuscular disease negative psych ROS   GI/Hepatic Neg liver ROS, PUD, GERD  Medicated,  Endo/Other  negative endocrine ROS  Renal/GU negative Renal ROS  negative genitourinary   Musculoskeletal   Abdominal   Peds  Hematology negative hematology ROS (+)   Anesthesia Other Findings   Reproductive/Obstetrics negative OB ROS                             Anesthesia Physical Anesthesia Plan  ASA: 3  Anesthesia Plan: General   Post-op Pain Management:    Induction:   PONV Risk Score and Plan: Propofol infusion  Airway Management Planned:   Additional Equipment:   Intra-op Plan:   Post-operative Plan:   Informed Consent: I have reviewed the patients History and Physical, chart, labs and discussed the procedure including the risks, benefits and alternatives for the proposed anesthesia with the patient or authorized representative who has indicated his/her understanding and acceptance.     Dental Advisory Given  Plan Discussed with: CRNA  Anesthesia Plan Comments:         Anesthesia Quick Evaluation

## 2021-08-17 NOTE — Anesthesia Procedure Notes (Signed)
Date/Time: 08/17/2021 11:28 AM Performed by: Orlie Dakin, CRNA Pre-anesthesia Checklist: Patient identified, Emergency Drugs available, Suction available and Patient being monitored Patient Re-evaluated:Patient Re-evaluated prior to induction Oxygen Delivery Method: Nasal cannula Induction Type: IV induction Placement Confirmation: positive ETCO2

## 2021-08-17 NOTE — Transfer of Care (Signed)
Immediate Anesthesia Transfer of Care Note  Patient: Patricia Hodges  Procedure(s) Performed: CARDIOVERSION  Patient Location: PACU  Anesthesia Type:General  Level of Consciousness: drowsy  Airway & Oxygen Therapy: Patient Spontanous Breathing and Patient connected to nasal cannula oxygen  Post-op Assessment: Report given to RN and Post -op Vital signs reviewed and stable  Post vital signs: Reviewed and stable  Last Vitals:  Vitals Value Taken Time  BP    Temp    Pulse    Resp    SpO2      Last Pain:  Vitals:   08/17/21 1057  TempSrc: Oral  PainSc:          Complications: No notable events documented.

## 2021-08-17 NOTE — H&P (Signed)
Procedure H&P  Patient presents for outpatient electrical cardioversion in the setting of persistent atrial fibrillation. For full history please refer to referenced clinic note below. She is on eliquis for stroke prevention. Plan for cardioversion today with the assistance of anesthesiology   Carlyle Dolly MD    Cardiology Office Note     Date:  08/11/2021    ID:  MAHAM QUINTIN, DOB 30-Dec-1945, MRN [id number]   PCP:  Sharilyn Sites, MD Cardiologist:  Rozann Lesches, MD 05/26/2021 in-hosp Electrphysiologist: None Rosaria Ferries, PA-C    No chief complaint on file.     History of Present Illness: Patricia Hodges is a 76 y.o. female with a history of HLD, GERD w/ schatzki's ring, OA, Afib   05/2021 admission for Afib, seen by Cards, started on Cardizem/Eliquis   JOLYNE LAYE presents for cardiology follow-up   She has been struggling a bit lately.  She has problems on a daily basis with being lightheaded and dizzy.  The symptoms seem to be made worse by exertion.  She feels staggery at times when she tries to walk up stairs.   She has not lost consciousness or fallen, but the symptoms have caused her to limit activity.   She has not had any significant chest pain, sometimes gets a twinge that goes away immediately.   She has not had lower extremity edema, no orthopnea or PND.   She does not feel the atrial fibrillation at all.  She has no idea when she went in it, the dizziness and other symptoms started several months ago.   She is confident that she has not missed any doses of her Eliquis.         Past Medical History:  Diagnosis Date   Basal cell carcinoma 04/25/2017    nod- left lower back (CX35FU)   Diverticulosis     Dysphagia     Esophageal reflux     Gastroenteritis, eosinophilic     H pylori ulcer     Hyperlipidemia     Osteoarthritis     S/P colonoscopy 2006    diverticulosis, due for repeat in 2016   S/P endoscopy 2007     Schatzki's Ring   SCC (squamous cell carcinoma) 07/22/2019    Left anterior neck- (cx55f)   Squamous cell carcinoma of skin 02/06/2011    in situ (CX35FU)           Past Surgical History:  Procedure Laterality Date   ANTERIOR CERVICAL DECOMP/DISCECTOMY FUSION N/A 01/30/2016    Procedure: C5-6 Anterior Cervical Discectomy and Fusion, Allograft, Plate;  Surgeon: MMarybelle Killings MD;  Location[facility]  Service: Orthopedics;  Laterality: N/A;   bladder tack       BUNIONECTOMY Right     CARPAL TUNNEL RELEASE Left     CATARACT EXTRACTION W/PHACO Right 11/28/2015    Procedure: CATARACT EXTRACTION PHACO AND INTRAOCULAR LENS PLACEMENT (IOC) RIGHT;  Surgeon: KTonny Mahin Guardia MD;  Location: [facility];  Service: Ophthalmology;  Laterality: Right;  CDE: 6.96   CATARACT EXTRACTION W/PHACO Left 12/15/2015    Procedure: CATARACT EXTRACTION PHACO AND INTRAOCULAR LENS PLACEMENT LEFT EYE CDE=9.04;  Surgeon: KTonny Keiosha Cancro MD;  Location: [facility];  Service: Ophthalmology;  Laterality: Left;  left   CESAREAN SECTION       CHOLECYSTECTOMY       COLONOSCOPY   2006   COLONOSCOPY N/A 09/08/2014    Procedure: COLONOSCOPY;  Surgeon: RDaneil Dolin MD;  Location: [facility];  Service: Endoscopy;  Laterality: N/A;  10:30 AM   COLONOSCOPY N/A 11/20/2017    Procedure: COLONOSCOPY;  Surgeon: Daneil Dolin, MD;  Location: [facility];  Service: Endoscopy;  Laterality: N/A;  10:30   feet surgery       HAND SURGERY Right      on ring finger due to swollen knuckle   Hx schatski's ring       POLYPECTOMY   11/20/2017    Procedure: POLYPECTOMY;  Surgeon: Daneil Dolin, MD;  Location: [facility];  Service: Endoscopy;;  ascending colon (CSx1) descending colon(CSx1)   S/P Hysterectomy                Current Outpatient Medications  Medication Sig Dispense Refill   apixaban (ELIQUIS) 5 MG TABS tablet Take 1 tablet (5 mg total) by mouth 2 (two) times daily. 60 tablet 1   ciprofloxacin (CIPRO) 500 MG tablet SMARTSIG:1 Tablet(s) By  Mouth Every 12 Hours       diltiazem (CARDIZEM CD) 120 MG 24 hr capsule Take 1 capsule (120 mg total) by mouth daily. 90 capsule 1   ezetimibe (ZETIA) 10 MG tablet Take 10 mg by mouth daily.       levothyroxine (SYNTHROID) 50 MCG tablet Take 50 mcg by mouth daily.       omeprazole (PRILOSEC OTC) 20 MG tablet Take 20 mg by mouth daily.       oxyCODONE (OXY IR/ROXICODONE) 5 MG immediate release tablet Take 5 mg by mouth every 6 (six) hours as needed for moderate pain.       CALCIUM-VITAMIN D PO Take 1 tablet by mouth daily. (Patient not taking: Reported on 08/11/2021)       cephALEXin (KEFLEX) 500 MG capsule Take 1 capsule (500 mg total) by mouth 4 (four) times daily. (Patient not taking: Reported on 08/11/2021) 20 capsule 0   chlorhexidine (HIBICLENS) 4 % external liquid Apply topically daily as needed. (Patient not taking: Reported on 08/11/2021) 120 mL 0   doxycycline (VIBRAMYCIN) 100 MG capsule Take 1 capsule (100 mg total) by mouth 2 (two) times daily. (Patient not taking: Reported on 08/11/2021) 14 capsule 0   mupirocin ointment (BACTROBAN) 2 % Apply 1 application. topically 2 (two) times daily. (Patient not taking: Reported on 08/11/2021) 22 g 0    No current facility-administered medications for this visit.      Allergies:   Nsaids, Alendronate sodium, Aspirin, Augmentin [amoxicillin-pot clavulanate], Celecoxib, Ezetimibe-simvastatin, Rofecoxib, and Statins      Social History:  The patient  reports that she quit smoking about 42 years ago. Her smoking use included cigarettes. She has a 20.00 pack-year smoking history. She has never used smokeless tobacco. She reports that she does not drink alcohol and does not use drugs.    Family History:  The patient's family history is not on file.  She indicated that her mother is deceased. She indicated that her father is deceased. She indicated that her sister is deceased. She indicated that her brother is deceased. She indicated that the status of her  neg hx is unknown.     ROS:  Please see the history of present illness. All other systems are reviewed and negative.      PHYSICAL EXAM: VS:  BP 114/70   Pulse 94   Ht '5\' 4"'$  (1.626 m)   Wt 190 lb 12.8 oz (86.5 kg)   SpO2 96%   BMI 32.75 kg/m  , BMI Body mass index is 32.75  kg/m. GEN: Well nourished, well developed, female in no acute distress HEENT: normal for age  Neck: no JVD, no carotid bruit, no masses Cardiac: Irregular rate and rhythm; no murmur, no rubs, or gallops Respiratory:  clear to auscultation bilaterally, normal work of breathing GI: soft, nontender, nondistended, + BS MS: no deformity or atrophy; no edema; distal pulses are 2+ in all 4 extremities  Skin: warm and dry, no rash Neuro:  Strength and sensation are intact Psych: euthymic mood, full affect     EKG:  EKG is ordered today. The ekg ordered today demonstrates atrial fibrillation, heart rate 72, no acute ischemic changes, no significant pauses   ECHO:  05/26/2021  1. Left ventricular ejection fraction, by estimation, is 60 to 65%. The  left ventricle has normal function. The left ventricle has no regional  wall motion abnormalities. There is mild left ventricular hypertrophy.  Left ventricular diastolic parameters  are indeterminate.   2. Right ventricular systolic function is normal. The right ventricular  size is normal. There is normal pulmonary artery systolic pressure. The  estimated right ventricular systolic pressure is 10.6 mmHg.   3. Left atrial size was upper normal.   4. The mitral valve is grossly normal. Trivial mitral valve  regurgitation.   5. The aortic valve is tricuspid. Aortic valve regurgitation is not  visualized. Aortic valve sclerosis is present, with no evidence of aortic  valve stenosis. Aortic valve mean gradient measures 2.0 mmHg.   6. The inferior vena cava is normal in size with greater than 50%  respiratory variability, suggesting right atrial pressure of 3 mmHg.       Recent Labs: 05/26/2021: ALT 23; Hemoglobin 15.6; Platelets 246; TSH 3.946 05/27/2021: BUN 17; Creatinine, Ser 1.23; Magnesium 2.2; Potassium 4.0; Sodium 140  CBC Labs (Brief)          Component Value Date/Time    WBC 10.7 (H) 05/26/2021 0834    RBC 5.35 (H) 05/26/2021 0834    HGB 15.6 (H) 05/26/2021 0834    HCT 48.7 (H) 05/26/2021 0834    PLT 246 05/26/2021 0834    MCV 91.0 05/26/2021 0834    MCH 29.2 05/26/2021 0834    MCHC 32.0 05/26/2021 0834    RDW 12.6 05/26/2021 0834    LYMPHSABS 1.9 05/26/2021 0834    MONOABS 0.8 05/26/2021 0834    EOSABS 0.5 05/26/2021 0834    BASOSABS 0.1 05/26/2021 0834          Latest Ref Rng & Units 05/27/2021    3:34 AM 05/26/2021    8:34 AM 01/12/2019    5:05 PM  CMP  Glucose 70 - 99 mg/dL 111   108   110    BUN 8 - 23 mg/dL '17   13   17    '$ Creatinine 0.44 - 1.00 mg/dL 1.23   1.15   0.75    Sodium 135 - 145 mmol/L 140   138   137    Potassium 3.5 - 5.1 mmol/L 4.0   3.9   4.0    Chloride 98 - 111 mmol/L 105   103   104    CO2 22 - 32 mmol/L '26   24   25    '$ Calcium 8.9 - 10.3 mg/dL 8.7   9.3   8.6    Total Protein 6.5 - 8.1 g/dL   7.8   7.4    Total Bilirubin 0.3 - 1.2 mg/dL   0.8   0.5  Alkaline Phos 38 - 126 U/L   98   130    AST 15 - 41 U/L   27   62    ALT 0 - 44 U/L   23   78          Lipid Panel Recent Labs  No results found for: CHOL, HDL, LDLCALC, LDLDIRECT, TRIG, CHOLHDL          Wt Readings from Last 3 Encounters:  08/11/21 190 lb 12.8 oz (86.5 kg)  07/08/21 182 lb (82.6 kg)  05/27/21 187 lb 2.7 oz (84.9 kg)      Other studies Reviewed: Additional studies/ records that were reviewed today include: Office notes, hospital records and testing.   ASSESSMENT AND PLAN:   1.  Persistent atrial fibrillation - Her heart rate is reasonably well controlled on Cardizem CD 120 mg daily, but she continues to have symptoms - It is not 100% clear that the symptoms are related to the atrial fibrillation, but the likelihood is  high - She has been compliant with the with the Eliquis, no doses missed - The risks and benefits of cardioversion were discussed with the patient and her son in the room. -They are agreeable to pursuing this, we will schedule it at the first opportunity   2.  Chronic anticoagulation: - She is on Eliquis 5 mg daily, no doses missed -  This patients CHA2DS2-VASc Score and unadjusted Ischemic Stroke Rate (% per year) is equal to 4.8 % stroke rate/year from a score of 4   Above score calculated as 1 point each if present [CHF, HTN, DM, Vascular=MI/PAD/Aortic Plaque, Age if 65-74, or Female] Above score calculated as 2 points each if present [Age > 75, or Stroke/TIA/TE]   3.  Hypertension: - Her blood pressure is well controlled on the diltiazem CD1 120 mg daily, continue this   Current medicines are reviewed at length with the patient today.  The patient does not have concerns regarding medicines.   The following changes have been made:  no change   Labs/ tests ordered today include:       Orders Placed This Encounter  Procedures   EKG 12-Lead        Disposition:   FU with Rozann Lesches, MD   Signed, Rosaria Ferries, PA-C  08/11/2021 3:38 PM    [place] Phone: [phone]; Fax: [phone]

## 2021-08-18 ENCOUNTER — Encounter (HOSPITAL_COMMUNITY): Payer: Self-pay | Admitting: Cardiology

## 2021-08-18 NOTE — Anesthesia Postprocedure Evaluation (Signed)
Anesthesia Post Note  Patient: Patricia Hodges  Procedure(s) Performed: CARDIOVERSION  Patient location during evaluation: Phase II Anesthesia Type: General Level of consciousness: awake Pain management: pain level controlled Vital Signs Assessment: post-procedure vital signs reviewed and stable Respiratory status: spontaneous breathing and respiratory function stable Cardiovascular status: blood pressure returned to baseline and stable Postop Assessment: no headache and no apparent nausea or vomiting Anesthetic complications: no Comments: Late entry   No notable events documented.   Last Vitals:  Vitals:   08/17/21 1230 08/17/21 1242  BP: (!) 149/83 (!) 159/74  Pulse: (!) 51 (!) 53  Resp: 16 16  Temp:    SpO2: 100% 97%    Last Pain:  Vitals:   08/17/21 1215  TempSrc:   PainSc: 0-No pain                 [name]

## 2021-08-29 DIAGNOSIS — E6609 Other obesity due to excess calories: Secondary | ICD-10-CM | POA: Diagnosis not present

## 2021-08-29 DIAGNOSIS — E039 Hypothyroidism, unspecified: Secondary | ICD-10-CM | POA: Diagnosis not present

## 2021-08-29 DIAGNOSIS — Z6831 Body mass index (BMI) 31.0-31.9, adult: Secondary | ICD-10-CM | POA: Diagnosis not present

## 2021-08-31 ENCOUNTER — Telehealth: Payer: Self-pay | Admitting: Cardiology

## 2021-08-31 NOTE — Telephone Encounter (Signed)
Pt calling to see if she is able to get a sooner f/u appt from having Cardioversion than 8/28. Please advise

## 2021-09-21 ENCOUNTER — Encounter: Payer: Self-pay | Admitting: Physician Assistant

## 2021-09-21 NOTE — Progress Notes (Addendum)
Cardiology Office Note    Date:  09/22/2021   ID:  [name], DOB 05/31/1945, MRN [id number]  PCP:  Sharilyn Sites, MD  Cardiologist:  Rozann Lesches, MD  Electrophysiologist:  None   Chief Complaint: f/u cardioversion  History of Present Illness:   Patricia Hodges is a 76 y.o. female with history of atrial fibrillation, HLD, GERD with Schatzki's ring, OA, very remote former tobacco use, diverticulosis, dysphagia, eosinophilic gastroenteritis, skin cancer, aortic atherosclerosis by CT who is seen for follow-up DCCV. She was seen by our team in 05/2021 for new onset AFib in the setting of a recent head cold. Echo 05/2021 EF 60-65%, mild LVH, normal PASP -> started on diltiazem + Eliquis. When seen in follow-up 07/2021 was still in afib though asymptomatic. DCCV was arranged and performed 08/17/21.  She is seen for follow-up today. Overall she has been asymptomatic from a cardiac standpoint except for several months of AM dizziness. This was reported at prior visit with APP as well. The description is generally vague, variable in length up to 30 minutes, primarily in the morning, not specifically related to head movements or position changes. She cannot do anything to provoke the dizziness and it improves as the day goes on. It feels like her "head is off." She cannot recall an exact onset but reports it's been going on for "quite a while," possibly even before AF diagnosis, but not entirely sure. She is in NSR. BP recheck was normal and though her BP decreased slightly upon standing from 865 systolic to 784, she did not become symptomatic with this. She reports that sometimes she feels off balance while walking but no overt falling. She denies any visual changes, hemiparesis, facial asymmetry or any other focal neurologic deficits. She also reports several weeks to months of a dry cough. No weight loss or hemoptysis.  Labwork independently reviewed: 05/2021 Mg 2.2, TSH wnl, K 3.9, Cr  1.15->1.23, LFTs ok, Hgb 15.6, plt wnl, trop neg x2   Cardiology Studies:   Studies reviewed are outlined and summarized above. Reports included below if pertinent.   2d echo 05/2021  1. Left ventricular ejection fraction, by estimation, is 60 to 65%. The  left ventricle has normal function. The left ventricle has no regional  wall motion abnormalities. There is mild left ventricular hypertrophy.  Left ventricular diastolic parameters  are indeterminate.   2. Right ventricular systolic function is normal. The right ventricular  size is normal. There is normal pulmonary artery systolic pressure. The  estimated right ventricular systolic pressure is 69.6 mmHg.   3. Left atrial size was upper normal.   4. The mitral valve is grossly normal. Trivial mitral valve  regurgitation.   5. The aortic valve is tricuspid. Aortic valve regurgitation is not  visualized. Aortic valve sclerosis is present, with no evidence of aortic  valve stenosis. Aortic valve mean gradient measures 2.0 mmHg.   6. The inferior vena cava is normal in size with greater than 50%  respiratory variability, suggesting right atrial pressure of 3 mmHg.   Comparison(s): No prior Echocardiogram.     Past Medical History:  Diagnosis Date   A-fib ([place])    Basal cell carcinoma 04/25/2017   nod- left lower back (CX35FU)   Diverticulosis    Dysphagia    Esophageal reflux    Gastroenteritis, eosinophilic    H pylori ulcer    Hyperlipidemia    Osteoarthritis    S/P colonoscopy 2006   diverticulosis, due for  repeat in 2016   S/P endoscopy 2007   Schatzki's Ring   SCC (squamous cell carcinoma) 07/22/2019   Left anterior neck- (cx79f)   Squamous cell carcinoma of skin 02/06/2011   in situ (CX35FU)    Past Surgical History:  Procedure Laterality Date   ANTERIOR CERVICAL DECOMP/DISCECTOMY FUSION N/A 01/30/2016   Procedure: C5-6 Anterior Cervical Discectomy and Fusion, Allograft, Plate;  Surgeon: MMarybelle Killings MD;   [facility]  Service: Orthopedics;  Laterality: N/A;   bladder tack     BUNIONECTOMY Right    CARDIOVERSION N/A 08/17/2021   Procedure: CARDIOVERSION;  Surgeon: BArnoldo Lenis MD;  Location: [facility];  Service: Endoscopy;  Laterality: N/A;   CARPAL TUNNEL RELEASE Left    CATARACT EXTRACTION W/PHACO Right 11/28/2015   Procedure: CATARACT EXTRACTION PHACO AND INTRAOCULAR LENS PLACEMENT (IOC) RIGHT;  Surgeon: KTonny Branch MD;  Location: [facility];  Service: Ophthalmology;  Laterality: Right;  CDE: 6.96   CATARACT EXTRACTION W/PHACO Left 12/15/2015   Procedure: CATARACT EXTRACTION PHACO AND INTRAOCULAR LENS PLACEMENT LEFT EYE CDE=9.04;  Surgeon: KTonny Branch MD;  Location: [facility];  Service: Ophthalmology;  Laterality: Left;  left   CESAREAN SECTION     CHOLECYSTECTOMY     COLONOSCOPY  2006   COLONOSCOPY N/A 09/08/2014   Procedure: COLONOSCOPY;  Surgeon: RDaneil Dolin MD;  [facility];  Service: Endoscopy;  Laterality: N/A;  10:30 AM   COLONOSCOPY N/A 11/20/2017   Procedure: COLONOSCOPY;  Surgeon: RDaneil Dolin MD;  [facility];  Service: Endoscopy;  Laterality: N/A;  10:30   feet surgery     HAND SURGERY Right    on ring finger due to swollen knuckle   Hx schatski's ring     POLYPECTOMY  11/20/2017   Procedure: POLYPECTOMY;  Surgeon: RDaneil Dolin MD;  [facility];  Service: Endoscopy;;  ascending colon (CSx1) descending colon(CSx1)   S/P Hysterectomy      Current Medications: Current Meds  Medication Sig   apixaban (ELIQUIS) 5 MG TABS tablet Take 1 tablet (5 mg total) by mouth 2 (two) times daily.   chlorhexidine (HIBICLENS) 4 % external liquid Apply topically daily as needed.   diltiazem (CARDIZEM CD) 120 MG 24 hr capsule Take 1 capsule (120 mg total) by mouth daily.   ezetimibe (ZETIA) 10 MG tablet Take 10 mg by mouth daily.   levothyroxine (SYNTHROID) 50 MCG tablet Take 50 mcg by mouth daily before breakfast.   mupirocin ointment (BACTROBAN) 2 %  Apply 1 application. topically 2 (two) times daily.   omeprazole (PRILOSEC OTC) 20 MG tablet Take 20 mg by mouth daily as needed (acid reflux).   oxyCODONE (OXY IR/ROXICODONE) 5 MG immediate release tablet Take 5 mg by mouth every 6 (six) hours as needed for moderate pain.   [DISCONTINUED] cephALEXin (KEFLEX) 500 MG capsule Take 1 capsule (500 mg total) by mouth 4 (four) times daily.   [DISCONTINUED] ciprofloxacin (CIPRO) 500 MG tablet Take 500 mg by mouth 2 (two) times daily.   [DISCONTINUED] doxycycline (VIBRAMYCIN) 100 MG capsule Take 1 capsule (100 mg total) by mouth 2 (two) times daily.      Allergies:   Nsaids, Alendronate sodium, Augmentin [amoxicillin-pot clavulanate], Ezetimibe-simvastatin, and Statins   Social History   Socioeconomic History   Marital status: Married    Spouse name: Not on file   Number of children: Not on file   Years of education: Not on file   Highest education  level: Not on file  Occupational History   Not on file  Tobacco Use   Smoking status: Former    Packs/day: 1.00    Years: 20.00    Total pack years: 20.00    Types: Cigarettes    Quit date: 11/23/1978    Years since quitting: 42.8   Smokeless tobacco: Never   Tobacco comments:    quit about 30 yrs ago  Vaping Use   Vaping Use: Never used  Substance and Sexual Activity   Alcohol use: No   Drug use: No   Sexual activity: Not on file  Other Topics Concern   Not on file  Social History Narrative   Not on file   Social Determinants of Health   Financial Resource Strain: Not on file  Food Insecurity: Not on file  Transportation Needs: Not on file  Physical Activity: Not on file  Stress: Not on file  Social Connections: Not on file     Family History:  The patient's family history is negative for Colon cancer.  ROS:   Please see the history of present illness.  All other systems are reviewed and otherwise negative.    EKG(s)/Additional Labs   EKG:  EKG is ordered today,  personally reviewed, demonstrating NSR 75bpm, no acute STT changes, QTc wnl  Recent Labs: 05/26/2021: ALT 23; Hemoglobin 15.6; Platelets 246; TSH 3.946 05/27/2021: BUN 17; Creatinine, Ser 1.23; Magnesium 2.2; Potassium 4.0; Sodium 140  Recent Lipid Panel No results found for: "CHOL", "TRIG", "HDL", "CHOLHDL", "VLDL", "LDLCALC", "LDLDIRECT"  PHYSICAL EXAM:    VS:  BP 130/88   Pulse 75   Ht '5\' 4"'$  (1.626 m)   Wt 189 lb 9.6 oz (86 kg)   SpO2 99%   BMI 32.54 kg/m   BMI: Body mass index is 32.54 kg/m.  GEN: Well nourished, well developed female in no acute distress HEENT: normocephalic, atraumatic Neck: no JVD, carotid bruits, or masses Cardiac: RRR; no murmurs, rubs, or gallops, no edema  Respiratory:  clear to auscultation bilaterally, normal work of breathing GI: soft, nontender, nondistended, + BS MS: no deformity or atrophy Skin: warm and dry, no rash Neuro:  Alert and Oriented x 3, Strength and sensation are intact, follows commands Psych: euthymic mood, full affect  Wt Readings from Last 3 Encounters:  09/22/21 189 lb 9.6 oz (86 kg)  08/17/21 189 lb 9.5 oz (86 kg)  08/11/21 190 lb 12.8 oz (86.5 kg)     ASSESSMENT & PLAN:   1. Dizziness - unclear etiology, no specific trigger, moreso occurring in the morning without specific position changes. She has not checked VS during events. She reports she's noticed overall feeling better since cardioversion so it's not clear to me that this has any relationship to her previously diagnosed atrial fib. Recheck BP by me was normal. I would like to pursue a 14-day monitor to ensure no significant brady- or tachy-arrhythmias to explain her symptoms. Will also pursue head CT given atypical description. Symptoms have been going on for months and are not acute today. I otherwise have suggested she schedule an appointment with her PCP to discuss further as well. We will check CBC, CMET, and TSH today.  2. Paroxysmal atrial fibrillation -  maintaining NSR, continue Eliquis and diltiazem.  3. Dry cough - appears euvolemic on exam, lungs clear. Will check CXR and labs above. If CXR is clear, may suggest she start taking trial of PPI regularly until she can f/u with PCP to review next  steps for evaluation. She is not on ACEI/ARB presently to explain this otherwise.  4. Aortic atherosclerosis, hyperlipidemia - lipids are managed by Dr. Hilma Favors per patient report and she is on ezetimibe, tolerating well.  5. CKD stage 3a - recheck labs today. Cr 1.1-1.2 in the hospital recently, possibly reflecting either AKI or CKD.    Disposition: F/u with myself in 6 weeks.   Medication Adjustments/Labs and Tests Ordered: Current medicines are reviewed at length with the patient today.  Concerns regarding medicines are outlined above. Medication changes, Labs and Tests ordered today are summarized above and listed in the Patient Instructions accessible in Encounters.    Signed, Charlie Pitter, PA-C  09/22/2021 1:34 PM    [facility] in [place]. [place], [place] [address] Ph: [phone]; Fax [phone]

## 2021-09-22 ENCOUNTER — Ambulatory Visit (HOSPITAL_COMMUNITY)
Admission: RE | Admit: 2021-09-22 | Discharge: 2021-09-22 | Disposition: A | Discharge status: Home / Self Care | Payer: Medicare HMO | Source: Ambulatory Visit | Attending: Physician Assistant | Admitting: Physician Assistant

## 2021-09-22 ENCOUNTER — Ambulatory Visit: Payer: Medicare HMO | Admitting: Physician Assistant

## 2021-09-22 ENCOUNTER — Ambulatory Visit (INDEPENDENT_AMBULATORY_CARE_PROVIDER_SITE_OTHER): Payer: Medicare HMO

## 2021-09-22 ENCOUNTER — Encounter: Payer: Self-pay | Admitting: Physician Assistant

## 2021-09-22 ENCOUNTER — Other Ambulatory Visit: Payer: Self-pay | Admitting: Physician Assistant

## 2021-09-22 ENCOUNTER — Other Ambulatory Visit (HOSPITAL_COMMUNITY)
Admission: RE | Admit: 2021-09-22 | Discharge: 2021-09-22 | Disposition: A | Discharge status: Home / Self Care | Payer: Medicare HMO | Source: Ambulatory Visit | Attending: Physician Assistant | Admitting: Physician Assistant

## 2021-09-22 VITALS — BP 130/88 | HR 75 | Ht 64.0 in | Wt 189.6 lb

## 2021-09-22 DIAGNOSIS — N1831 Chronic kidney disease, stage 3a: Secondary | ICD-10-CM | POA: Diagnosis not present

## 2021-09-22 DIAGNOSIS — R42 Dizziness and giddiness: Secondary | ICD-10-CM

## 2021-09-22 DIAGNOSIS — I700 Atherosclerosis of aorta: Secondary | ICD-10-CM

## 2021-09-22 DIAGNOSIS — I480 Paroxysmal atrial fibrillation: Secondary | ICD-10-CM

## 2021-09-22 DIAGNOSIS — E785 Hyperlipidemia, unspecified: Secondary | ICD-10-CM

## 2021-09-22 DIAGNOSIS — R058 Other specified cough: Secondary | ICD-10-CM

## 2021-09-22 DIAGNOSIS — R059 Cough, unspecified: Secondary | ICD-10-CM | POA: Diagnosis not present

## 2021-09-22 LAB — CBC
HCT: 45 % (ref 36.0–46.0)
Hemoglobin: 15 g/dL (ref 12.0–15.0)
MCH: 30.4 pg (ref 26.0–34.0)
MCHC: 33.3 g/dL (ref 30.0–36.0)
MCV: 91.3 fL (ref 80.0–100.0)
Platelets: 281 10*3/uL (ref 150–400)
RBC: 4.93 MIL/uL (ref 3.87–5.11)
RDW: 12.7 % (ref 11.5–15.5)
WBC: 9.1 10*3/uL (ref 4.0–10.5)
nRBC: 0 % (ref 0.0–0.2)

## 2021-09-22 LAB — COMPREHENSIVE METABOLIC PANEL
ALT: 39 U/L (ref 0–44)
AST: 40 U/L (ref 15–41)
Albumin: 4.2 g/dL (ref 3.5–5.0)
Alkaline Phosphatase: 89 U/L (ref 38–126)
Anion gap: 7 (ref 5–15)
BUN: 18 mg/dL (ref 8–23)
CO2: 25 mmol/L (ref 22–32)
Calcium: 9.2 mg/dL (ref 8.9–10.3)
Chloride: 106 mmol/L (ref 98–111)
Creatinine, Ser: 1.02 mg/dL — ABNORMAL HIGH (ref 0.44–1.00)
GFR, Estimated: 56 mL/min — ABNORMAL LOW (ref >60)
Glucose, Bld: 98 mg/dL (ref 70–99)
Potassium: 4.2 mmol/L (ref 3.5–5.1)
Sodium: 138 mmol/L (ref 135–145)
Total Bilirubin: 0.7 mg/dL (ref 0.3–1.2)
Total Protein: 7.6 g/dL (ref 6.5–8.1)

## 2021-09-22 LAB — TSH: TSH: 2.531 u[IU]/mL (ref 0.350–4.500)

## 2021-09-22 NOTE — Patient Instructions (Signed)
Medication Instructions:  Your physician recommends that you continue on your current medications as directed. Please refer to the Current Medication list given to you today.   Labwork: CMET CBC TSH  Testing/Procedures: A chest x-ray takes a picture of the organs and structures inside the chest, including the heart, lungs, and blood vessels. This test can show several things, including, whether the heart is enlarges; whether fluid is building up in the lungs; and whether pacemaker / defibrillator leads are still in place.  Non-Cardiac CT scanning, (CAT scanning), is a noninvasive, special x-ray that produces cross-sectional images of the body using x-rays and a computer. CT scans help physicians diagnose and treat medical conditions. For some CT exams, a contrast material is used to enhance visibility in the area of the body being studied. CT scans provide greater clarity and reveal more details than regular x-ray exams.   Follow-Up: Follow up with Melina Copa, PA-C in 6 weeks.  Any Other Special Instructions Will Be Listed Below (If Applicable).     If you need a refill on your cardiac medications before your next appointment, please call your pharmacy.

## 2021-09-25 DIAGNOSIS — R42 Dizziness and giddiness: Secondary | ICD-10-CM

## 2021-09-26 DIAGNOSIS — I4891 Unspecified atrial fibrillation: Secondary | ICD-10-CM | POA: Diagnosis not present

## 2021-09-26 DIAGNOSIS — J069 Acute upper respiratory infection, unspecified: Secondary | ICD-10-CM | POA: Diagnosis not present

## 2021-09-26 DIAGNOSIS — E6609 Other obesity due to excess calories: Secondary | ICD-10-CM | POA: Diagnosis not present

## 2021-10-05 ENCOUNTER — Ambulatory Visit: Payer: Medicare HMO | Admitting: Student

## 2021-10-10 ENCOUNTER — Telehealth: Payer: Self-pay | Admitting: Cardiology

## 2021-10-10 NOTE — Telephone Encounter (Signed)
Looks like it is awaiting MD read, please make sure it is in process to be read Please re--offer second monitor to complete the 14 day wear time Otherwise f/u as planned

## 2021-10-10 NOTE — Telephone Encounter (Signed)
Returned call to pt. No answer. Left msg to call back.  

## 2021-10-10 NOTE — Telephone Encounter (Signed)
Patient called stating she was unable to do the heart monitor and has sent the equipment back.  She would like a call back on her next steps as she has an appt scheduled on 8/18.

## 2021-10-10 NOTE — Telephone Encounter (Signed)
Spoke with pt who states that she was unabled to wear monitor because it came off after 3 days. Pt states that she sent the monitor back and now would like to know what the next step will be.

## 2021-10-11 NOTE — Telephone Encounter (Signed)
Spoke with pt and she agrees to wear another Zio monitor and have placed by our office.

## 2021-10-12 ENCOUNTER — Telehealth: Payer: Self-pay | Admitting: Cardiology

## 2021-10-12 ENCOUNTER — Other Ambulatory Visit: Payer: Self-pay | Admitting: Physician Assistant

## 2021-10-12 ENCOUNTER — Ambulatory Visit (INDEPENDENT_AMBULATORY_CARE_PROVIDER_SITE_OTHER): Payer: Medicare HMO

## 2021-10-12 ENCOUNTER — Telehealth: Payer: Self-pay | Admitting: *Deleted

## 2021-10-12 DIAGNOSIS — R42 Dizziness and giddiness: Secondary | ICD-10-CM

## 2021-10-12 NOTE — Telephone Encounter (Signed)
Pt c/o medication issue:  1. Name of Medication: Eliquis  2. How are you currently taking this medication (dosage and times per day)?   3. Are you having a reaction (difficulty breathing--STAT)?   4. What is your medication issue? Can she be changed to another medicine- Eliquis too expensive

## 2021-10-12 NOTE — Telephone Encounter (Signed)
Pt in office to have second monitor placed by staff. Instructions reviewed. Pt enrolled in monitors

## 2021-10-12 NOTE — Telephone Encounter (Signed)
Spoke with pt and will mail patient assistance forms to pt.

## 2021-10-12 NOTE — Telephone Encounter (Signed)
-----   Message from Charlie Pitter, [place] sent at 10/10/2021 11:27 AM EDT ----- Incomplete monitor (ordered for 14 days). Let pt know monitor was benign and did not show any dangerous arrhythmias. Brief skips from top and bottom of heart, nothing sustained.  See phone note from today about offering a repeat to complete the study.

## 2021-10-13 DIAGNOSIS — R42 Dizziness and giddiness: Secondary | ICD-10-CM | POA: Diagnosis not present

## 2021-10-20 ENCOUNTER — Other Ambulatory Visit (HOSPITAL_COMMUNITY): Payer: Medicare HMO

## 2021-10-23 NOTE — Telephone Encounter (Signed)
Patient is following up, requesting to speak with RN regarding medication.

## 2021-10-24 NOTE — Telephone Encounter (Signed)
Spoke with pt and informed her that she needs to spend $ 593.80 in prescription cost. Then she will be approved for patient assistance.

## 2021-10-27 ENCOUNTER — Telehealth: Payer: Self-pay | Admitting: Cardiology

## 2021-10-27 ENCOUNTER — Telehealth: Payer: Self-pay | Admitting: *Deleted

## 2021-10-27 NOTE — Telephone Encounter (Signed)
Called to notify pt that she has been approved for the [employer] PAF until 03/18/22. No answer left msg to call back.

## 2021-10-27 NOTE — Telephone Encounter (Signed)
Follow Up:     Patient is returning Lukisha's call from today.

## 2021-10-27 NOTE — Telephone Encounter (Signed)
Pt notified that she was approved for the Eliquis patient assistance foundation.

## 2021-11-02 NOTE — Progress Notes (Signed)
Cardiology Office Note    Date:  11/03/2021   ID:  Patricia Hodges, DOB 01/31/1946, MRN [id number]  PCP:  Sharilyn Sites, MD  Cardiologist: Rozann Lesches, MD    Chief Complaint  Patient presents with   Follow-up    6 week visit    History of Present Illness:    Patricia Hodges is a 76 y.o. female with past medical history of persistent atrial fibrillation (diagnosed in 05/2021 and underwent successful DCCV in 08/2021), HLD, GERD, OA, Stage 3 CKD, diverticulosis and aortic atherosclerosis by prior CT who presents to the office today for 6-week follow-up.  She was examined by Melina Copa, PA-C in 09/2021 and reported episodic dizziness which was typically worse in the morning hours. She was in normal sinus rhythm at the time of her visit and a follow-up 2-week ZIO monitor was recommended along with a Head CT. It appears her Head CT was never obtained. She did have issues with her Zio patch and wore two different monitors but this showed predominantly normal sinus rhythm with rare PAC's and PVC's representing less than 1% of total beats and only 1 episode of SVT lasting for 13 beats with no sustained arrhythmias or pauses.  In talking with the patient today, she reports overall feeling well since her last office visit. Reports her dizziness has improved over the past several months. Had trouble with wearing the monitors and also reports her insurance did not want to cover them. She denies any recent chest pain or palpitations. No recent dyspnea on exertion, orthopnea, PND or lower extremity edema. She does not check her blood pressure routinely at home but says this and her heart rate have been well controlled when checked on occasion. She remains on Eliquis for anticoagulation and denies any recent melena, hematochezia or hematuria.   Past Medical History:  Diagnosis Date   A-fib [facility])    Atrial fibrillation ([place])    a. diagnosed in 05/2021 and underwent successful DCCV in  08/2021   Basal cell carcinoma 04/25/2017   nod- left lower back (CX35FU)   Diverticulosis    Dysphagia    Esophageal reflux    Gastroenteritis, eosinophilic    H pylori ulcer    Hyperlipidemia    Osteoarthritis    S/P colonoscopy 2006   diverticulosis, due for repeat in 2016   S/P endoscopy 2007   Schatzki's Ring   SCC (squamous cell carcinoma) 07/22/2019   Left anterior neck- (cx53f)   Squamous cell carcinoma of skin 02/06/2011   in situ (CX35FU)    Past Surgical History:  Procedure Laterality Date   ANTERIOR CERVICAL DECOMP/DISCECTOMY FUSION N/A 01/30/2016   Procedure: C5-6 Anterior Cervical Discectomy and Fusion, Allograft, Plate;  Surgeon: MMarybelle Killings MD;  Location: [facility]  Service: Orthopedics;  Laterality: N/A;   bladder tack     BUNIONECTOMY Right    CARDIOVERSION N/A 08/17/2021   Procedure: CARDIOVERSION;  Surgeon: BArnoldo Lenis MD;  Location: [facility];  Service: Endoscopy;  Laterality: N/A;   CARPAL TUNNEL RELEASE Left    CATARACT EXTRACTION W/PHACO Right 11/28/2015   Procedure: CATARACT EXTRACTION PHACO AND INTRAOCULAR LENS PLACEMENT (IOC) RIGHT;  Surgeon: KTonny Branch MD;  Location: [facility];  Service: Ophthalmology;  Laterality: Right;  CDE: 6.96   CATARACT EXTRACTION W/PHACO Left 12/15/2015   Procedure: CATARACT EXTRACTION PHACO AND INTRAOCULAR LENS PLACEMENT LEFT EYE CDE=9.04;  Surgeon: KTonny Branch MD;  Location: [facility];  Service: Ophthalmology;  Laterality: Left;  left  CESAREAN SECTION     CHOLECYSTECTOMY     COLONOSCOPY  2006   COLONOSCOPY N/A 09/08/2014   Procedure: COLONOSCOPY;  Surgeon: Daneil Dolin, MD;  [facility];  Service: Endoscopy;  Laterality: N/A;  10:30 AM   COLONOSCOPY N/A 11/20/2017   Procedure: COLONOSCOPY;  Surgeon: Daneil Dolin, MD;  [facility];  Service: Endoscopy;  Laterality: N/A;  10:30   feet surgery     HAND SURGERY Right    on ring finger due to swollen knuckle   Hx schatski's ring     POLYPECTOMY   11/20/2017   Procedure: POLYPECTOMY;  Surgeon: Daneil Dolin, MD;  [facility];  Service: Endoscopy;;  ascending colon (CSx1) descending colon(CSx1)   S/P Hysterectomy      Current Medications: Outpatient Medications Prior to Visit  Medication Sig Dispense Refill   apixaban (ELIQUIS) 5 MG TABS tablet Take 1 tablet (5 mg total) by mouth 2 (two) times daily. 60 tablet 1   chlorhexidine (HIBICLENS) 4 % external liquid Apply topically daily as needed. 120 mL 0   diltiazem (CARDIZEM CD) 120 MG 24 hr capsule Take 1 capsule (120 mg total) by mouth daily. 90 capsule 1   ezetimibe (ZETIA) 10 MG tablet Take 10 mg by mouth daily.     levothyroxine (SYNTHROID) 50 MCG tablet Take 50 mcg by mouth daily before breakfast.     mupirocin ointment (BACTROBAN) 2 % Apply 1 application. topically 2 (two) times daily. 22 g 0   omeprazole (PRILOSEC OTC) 20 MG tablet Take 20 mg by mouth daily as needed (acid reflux).     oxyCODONE (OXY IR/ROXICODONE) 5 MG immediate release tablet Take 5 mg by mouth every 6 (six) hours as needed for moderate pain.     No facility-administered medications prior to visit.     Allergies:   Nsaids, Alendronate sodium, Augmentin [amoxicillin-pot clavulanate], Ezetimibe-simvastatin, and Statins   Social History   Socioeconomic History   Marital status: Married    Spouse name: Not on file   Number of children: Not on file   Years of education: Not on file   Highest education level: Not on file  Occupational History   Not on file  Tobacco Use   Smoking status: Former    Packs/day: 1.00    Years: 20.00    Total pack years: 20.00    Types: Cigarettes    Quit date: 11/23/1978    Years since quitting: 42.9   Smokeless tobacco: Never   Tobacco comments:    quit about 30 yrs ago  Vaping Use   Vaping Use: Never used  Substance and Sexual Activity   Alcohol use: No   Drug use: No   Sexual activity: Not on file  Other Topics Concern   Not on file  Social History  Narrative   Not on file   Social Determinants of Health   Financial Resource Strain: Not on file  Food Insecurity: Not on file  Transportation Needs: Not on file  Physical Activity: Not on file  Stress: Not on file  Social Connections: Not on file     Family History:  The patient's family history is not on file.   Review of Systems:    Please see the history of present illness.     All other systems reviewed and are otherwise negative except as noted above.   Physical Exam:    VS:  BP 126/82   Pulse 80  Ht '5\' 4"'$  (1.626 m)   Wt 191 lb 9.6 oz (86.9 kg)   SpO2 96%   BMI 32.89 kg/m    General: Well developed, well nourished,female appearing in no acute distress. Head: Normocephalic, atraumatic. Neck: No carotid bruits. JVD not elevated.  Lungs: Respirations regular and unlabored, without wheezes or rales.  Heart: Regular rate and rhythm. No S3 or S4.  No murmur, no rubs, or gallops appreciated. Abdomen: Appears non-distended. No obvious abdominal masses. Msk:  Strength and tone appear normal for age. No obvious joint deformities or effusions. Extremities: No clubbing or cyanosis. No pitting edema.  Distal pedal pulses are 2+ bilaterally. Neuro: Alert and oriented X 3. Moves all extremities spontaneously. No focal deficits noted. Psych:  Responds to questions appropriately with a normal affect. Skin: No rashes or lesions noted  Wt Readings from Last 3 Encounters:  11/03/21 191 lb 9.6 oz (86.9 kg)  09/22/21 189 lb 9.6 oz (86 kg)  08/17/21 189 lb 9.5 oz (86 kg)     Studies/Labs Reviewed:   EKG:  EKG is not ordered today.   Recent Labs: 05/27/2021: Magnesium 2.2 09/22/2021: ALT 39; BUN 18; Creatinine, Ser 1.02; Hemoglobin 15.0; Platelets 281; Potassium 4.2; Sodium 138; TSH 2.531   Lipid Panel No results found for: "CHOL", "TRIG", "HDL", "CHOLHDL", "VLDL", "LDLCALC", "LDLDIRECT"  Additional studies/ records that were reviewed today include:   Echocardiogram:  05/2021 IMPRESSIONS     1. Left ventricular ejection fraction, by estimation, is 60 to 65%. The  left ventricle has normal function. The left ventricle has no regional  wall motion abnormalities. There is mild left ventricular hypertrophy.  Left ventricular diastolic parameters  are indeterminate.   2. Right ventricular systolic function is normal. The right ventricular  size is normal. There is normal pulmonary artery systolic pressure. The  estimated right ventricular systolic pressure is 19.4 mmHg.   3. Left atrial size was upper normal.   4. The mitral valve is grossly normal. Trivial mitral valve  regurgitation.   5. The aortic valve is tricuspid. Aortic valve regurgitation is not  visualized. Aortic valve sclerosis is present, with no evidence of aortic  valve stenosis. Aortic valve mean gradient measures 2.0 mmHg.   6. The inferior vena cava is normal in size with greater than 50%  respiratory variability, suggesting right atrial pressure of 3 mmHg.   Comparison(s): No prior Echocardiogram.  Event Monitor: 09/2021 Predominant rhythm is sinus with heart rate ranging from 47 bpm up to 132 bpm and average heart rate 78 bpm. There were rare PACs including atrial couplets and triplets representing less than 1% total beats. There were rare PVCs including ventricular couplets representing less than 1% total beats. Single episode of SVT noted lasting only 13 beats, no sustained arrhythmias. There were no pauses.   Assessment:    1. Persistent atrial fibrillation ([place])   2. Current use of long term anticoagulation   3. Hyperlipidemia LDL goal <70   4. Stage 3a chronic kidney disease ([place])      Plan:   In order of problems listed above:  1. Persistent Atrial Fibrillation/Long-term Anticoagulation - This was diagnosed in 05/2021 and she underwent successful DCCV in 08/2021. She did wear an outpatient monitor which showed no recurrent arrhythmias. She denies any recent  palpitations and is in normal sinus rhythm by examination today. Continue Cardizem CD '120mg'$  daily.  - No reports of active bleeding. Continue Eliquis '5mg'$  BID for anticoagulation.   2. HLD - Followed by her  PCP. She has remained on Zetia '10mg'$  daily as she was previously intolerant to statins.   3. Stage 3 CKD - Creatinine had improved to 1.02 by recent labs in 09/2021.    Medication Adjustments/Labs and Tests Ordered: Current medicines are reviewed at length with the patient today.  Concerns regarding medicines are outlined above.  Medication changes, Labs and Tests ordered today are listed in the Patient Instructions below. Patient Instructions  Medication Instructions:  Your physician recommends that you continue on your current medications as directed. Please refer to the Current Medication list given to you today.  *If you need a refill on your cardiac medications before your next appointment, please call your pharmacy*   Lab Work: NONE   If you have labs (blood work) drawn today and your tests are completely normal, you will receive your results only by: [place] (if you have MyChart) OR A paper copy in the mail If you have any lab test that is abnormal or we need to change your treatment, we will call you to review the results.   Testing/Procedures: NONE    Follow-Up: At [facility], you and your health needs are our priority.  As part of our continuing mission to provide you with exceptional heart care, we have created designated Provider Care Teams.  These Care Teams include your primary Cardiologist (physician) and Advanced Practice Providers (APPs -  Physician Assistants and Nurse Practitioners) who all work together to provide you with the care you need, when you need it.  We recommend signing up for the patient portal called "MyChart".  Sign up information is provided on this After Visit Summary.  MyChart is used to connect with patients for Virtual Visits  (Telemedicine).  Patients are able to view lab/test results, encounter notes, upcoming appointments, etc.  Non-urgent messages can be sent to your provider as well.   To learn more about what you can do with MyChart, go to [email or web address].    Your next appointment:   6 month(s)  The format for your next appointment:   In Person  Provider:   Rozann Lesches, MD    Other Instructions Thank you for choosing [facility]!    Important Information About Sugar         Signed, Erma Heritage, PA-C  11/03/2021 4:23 PM    [facility] [address], [place] [address] Phone: [phone] Fax: [phone]

## 2021-11-03 ENCOUNTER — Encounter: Payer: Self-pay | Admitting: Student

## 2021-11-03 ENCOUNTER — Ambulatory Visit: Payer: Medicare HMO | Admitting: Student

## 2021-11-03 VITALS — BP 126/82 | HR 80 | Ht 64.0 in | Wt 191.6 lb

## 2021-11-03 DIAGNOSIS — N1831 Chronic kidney disease, stage 3a: Secondary | ICD-10-CM

## 2021-11-03 DIAGNOSIS — I4819 Other persistent atrial fibrillation: Secondary | ICD-10-CM | POA: Diagnosis not present

## 2021-11-03 DIAGNOSIS — E785 Hyperlipidemia, unspecified: Secondary | ICD-10-CM

## 2021-11-03 DIAGNOSIS — Z7901 Long term (current) use of anticoagulants: Secondary | ICD-10-CM | POA: Diagnosis not present

## 2021-11-03 NOTE — Patient Instructions (Signed)
Medication Instructions:  Your physician recommends that you continue on your current medications as directed. Please refer to the Current Medication list given to you today.  *If you need a refill on your cardiac medications before your next appointment, please call your pharmacy*   Lab Work: NONE   If you have labs (blood work) drawn today and your tests are completely normal, you will receive your results only by: MyChart Message (if you have MyChart) OR A paper copy in the mail If you have any lab test that is abnormal or we need to change your treatment, we will call you to review the results.   Testing/Procedures: NONE    Follow-Up: At [facility], you and your health needs are our priority.  As part of our continuing mission to provide you with exceptional heart care, we have created designated Provider Care Teams.  These Care Teams include your primary Cardiologist (physician) and Advanced Practice Providers (APPs -  Physician Assistants and Nurse Practitioners) who all work together to provide you with the care you need, when you need it.  We recommend signing up for the patient portal called "MyChart".  Sign up information is provided on this After Visit Summary.  MyChart is used to connect with patients for Virtual Visits (Telemedicine).  Patients are able to view lab/test results, encounter notes, upcoming appointments, etc.  Non-urgent messages can be sent to your provider as well.   To learn more about what you can do with MyChart, go to [email or web address].    Your next appointment:   6 month(s)  The format for your next appointment:   In Person  Provider:   Samuel McDowell, MD    Other Instructions Thank you for choosing [facility]!    Important Information About Sugar       

## 2021-11-13 ENCOUNTER — Ambulatory Visit: Payer: Medicare HMO | Admitting: Medical

## 2021-12-11 DIAGNOSIS — Z85828 Personal history of other malignant neoplasm of skin: Secondary | ICD-10-CM | POA: Diagnosis not present

## 2021-12-11 DIAGNOSIS — L570 Actinic keratosis: Secondary | ICD-10-CM | POA: Diagnosis not present

## 2021-12-11 DIAGNOSIS — B009 Herpesviral infection, unspecified: Secondary | ICD-10-CM | POA: Diagnosis not present

## 2021-12-21 DIAGNOSIS — I4891 Unspecified atrial fibrillation: Secondary | ICD-10-CM | POA: Diagnosis not present

## 2021-12-21 DIAGNOSIS — E6609 Other obesity due to excess calories: Secondary | ICD-10-CM | POA: Diagnosis not present

## 2021-12-21 DIAGNOSIS — Z6831 Body mass index (BMI) 31.0-31.9, adult: Secondary | ICD-10-CM | POA: Diagnosis not present

## 2021-12-21 DIAGNOSIS — M545 Low back pain, unspecified: Secondary | ICD-10-CM | POA: Diagnosis not present

## 2021-12-21 DIAGNOSIS — Z23 Encounter for immunization: Secondary | ICD-10-CM | POA: Diagnosis not present

## 2022-01-01 ENCOUNTER — Other Ambulatory Visit: Payer: Self-pay | Admitting: Cardiology

## 2022-01-03 DIAGNOSIS — I4891 Unspecified atrial fibrillation: Secondary | ICD-10-CM | POA: Diagnosis not present

## 2022-01-03 DIAGNOSIS — M1991 Primary osteoarthritis, unspecified site: Secondary | ICD-10-CM | POA: Diagnosis not present

## 2022-01-03 DIAGNOSIS — N390 Urinary tract infection, site not specified: Secondary | ICD-10-CM | POA: Diagnosis not present

## 2022-01-03 DIAGNOSIS — E6609 Other obesity due to excess calories: Secondary | ICD-10-CM | POA: Diagnosis not present

## 2022-01-03 DIAGNOSIS — Z6831 Body mass index (BMI) 31.0-31.9, adult: Secondary | ICD-10-CM | POA: Diagnosis not present

## 2022-01-18 DIAGNOSIS — L570 Actinic keratosis: Secondary | ICD-10-CM | POA: Diagnosis not present

## 2022-03-26 DIAGNOSIS — M19041 Primary osteoarthritis, right hand: Secondary | ICD-10-CM | POA: Diagnosis not present

## 2022-03-26 DIAGNOSIS — M1812 Unilateral primary osteoarthritis of first carpometacarpal joint, left hand: Secondary | ICD-10-CM | POA: Insufficient documentation

## 2022-03-26 DIAGNOSIS — M19042 Primary osteoarthritis, left hand: Secondary | ICD-10-CM | POA: Diagnosis not present

## 2022-03-26 DIAGNOSIS — M79642 Pain in left hand: Secondary | ICD-10-CM | POA: Diagnosis not present

## 2022-03-26 DIAGNOSIS — M79641 Pain in right hand: Secondary | ICD-10-CM | POA: Diagnosis not present

## 2022-03-30 ENCOUNTER — Emergency Department (HOSPITAL_COMMUNITY)
Admission: EM | Admit: 2022-03-30 | Discharge: 2022-03-30 | Disposition: A | Discharge status: Home / Self Care | Payer: Medicare HMO | Attending: Emergency Medicine | Admitting: Emergency Medicine

## 2022-03-30 ENCOUNTER — Emergency Department (HOSPITAL_COMMUNITY): Payer: Medicare HMO

## 2022-03-30 DIAGNOSIS — R42 Dizziness and giddiness: Secondary | ICD-10-CM

## 2022-03-30 DIAGNOSIS — Z79899 Other long term (current) drug therapy: Secondary | ICD-10-CM | POA: Diagnosis not present

## 2022-03-30 DIAGNOSIS — I10 Essential (primary) hypertension: Secondary | ICD-10-CM

## 2022-03-30 DIAGNOSIS — R001 Bradycardia, unspecified: Secondary | ICD-10-CM | POA: Diagnosis not present

## 2022-03-30 DIAGNOSIS — Z7901 Long term (current) use of anticoagulants: Secondary | ICD-10-CM | POA: Insufficient documentation

## 2022-03-30 LAB — URINALYSIS, ROUTINE W REFLEX MICROSCOPIC
Bacteria, UA: NONE SEEN
Bilirubin Urine: NEGATIVE
Glucose, UA: NEGATIVE mg/dL
Ketones, ur: NEGATIVE mg/dL
Leukocytes,Ua: NEGATIVE
Nitrite: NEGATIVE
Protein, ur: NEGATIVE mg/dL
Specific Gravity, Urine: 1.008 (ref 1.005–1.030)
pH: 5 (ref 5.0–8.0)

## 2022-03-30 LAB — BASIC METABOLIC PANEL
Anion gap: 10 (ref 5–15)
BUN: 23 mg/dL (ref 8–23)
CO2: 23 mmol/L (ref 22–32)
Calcium: 9 mg/dL (ref 8.9–10.3)
Chloride: 104 mmol/L (ref 98–111)
Creatinine, Ser: 0.84 mg/dL (ref 0.44–1.00)
GFR, Estimated: >60 mL/min (ref >60)
Glucose, Bld: 131 mg/dL — ABNORMAL HIGH (ref 70–99)
Potassium: 3.6 mmol/L (ref 3.5–5.1)
Sodium: 137 mmol/L (ref 135–145)

## 2022-03-30 LAB — CBC
HCT: 46.3 % — ABNORMAL HIGH (ref 36.0–46.0)
Hemoglobin: 15.1 g/dL — ABNORMAL HIGH (ref 12.0–15.0)
MCH: 30.1 pg (ref 26.0–34.0)
MCHC: 32.6 g/dL (ref 30.0–36.0)
MCV: 92.2 fL (ref 80.0–100.0)
Platelets: 308 10*3/uL (ref 150–400)
RBC: 5.02 MIL/uL (ref 3.87–5.11)
RDW: 13 % (ref 11.5–15.5)
WBC: 10.2 10*3/uL (ref 4.0–10.5)
nRBC: 0 % (ref 0.0–0.2)

## 2022-03-30 LAB — TROPONIN I (HIGH SENSITIVITY)
Troponin I (High Sensitivity): 3 ng/L (ref <18)
Troponin I (High Sensitivity): 3 ng/L (ref <18)

## 2022-03-30 LAB — CBG MONITORING, ED
Glucose-Capillary: 155 mg/dL — ABNORMAL HIGH (ref 70–99)
Glucose-Capillary: 92 mg/dL (ref 70–99)

## 2022-03-30 MED ORDER — LISINOPRIL 5 MG PO TABS: 5.0000 mg | ORAL_TABLET | Freq: Every day | ORAL | 0 refills | Status: DC

## 2022-03-30 NOTE — ED Triage Notes (Signed)
Pt to ED c/o lightlessness that started this morning. Reports SHOB yesterday, but none currently. Reports hypertensive at home. HX AFIB.

## 2022-03-30 NOTE — ED Notes (Signed)
Pt given crackers and water per request

## 2022-03-30 NOTE — Discharge Instructions (Addendum)
Please pick up the blood pressure medication I have prescribed for you.  Please make an appointment with your PCP in the next few days in regards to recent ER visit and high blood pressure.  If you experience facial swelling/neck swelling/coughing with this blood pressure medication please discontinue and return to ER.

## 2022-03-30 NOTE — ED Provider Notes (Cosign Needed Addendum)
[facility] EMERGENCY DEPARTMENT Provider Note   CSN: [id number] Arrival date & time: 03/30/22  1340     History  Chief Complaint  Patient presents with   Dizziness   *Daughter-in-law was present to assist in history  Patricia Hodges is a 77 y.o. female, h/o a fib w rvr with cardioversion 08/17/21, h pylori, presented today with dizziness that began when she woke up this morning.  Patient stated when she got out of bed she felt unstable and needed to hold onto the bed and railing to not fall over.  Patient denied spinning of room.  Patient states dizziness lasted all morning but has since resolved. Patient stated "my head does not feel right" but could not elaborate. HA is 4/10 bilateral frontal without radiation. Patient denied HA being sudden and massive at onset. Patient denied any medication changes or missing any medications.  She is currently on Eliquis for A-fib. Patient denied having HTN history.  Patient denied fever, chest pain, shortness of breath, recent illness, sick contacts, dysuria, vision changes, tinnitus, dizziness with changes in head position, syncope, fall, trauma, melena, hematemesis.    Home Medications Prior to Admission medications   Medication Sig Start Date End Date Taking? Authorizing Provider  lisinopril (ZESTRIL) 5 MG tablet Take 1 tablet (5 mg total) by mouth daily. 03/30/22  Yes Maynor Mwangi, Florene Route, PA-C  apixaban (ELIQUIS) 5 MG TABS tablet Take 1 tablet (5 mg total) by mouth 2 (two) times daily. 07/26/21   Satira Sark, MD  CARTIA XT 120 MG 24 hr capsule Take 1 capsule by mouth once daily 01/01/22   Satira Sark, MD  chlorhexidine (HIBICLENS) 4 % external liquid Apply topically daily as needed. 07/08/21   Volney American, PA-C  ezetimibe (ZETIA) 10 MG tablet Take 10 mg by mouth daily. 05/15/19   [provider]  levothyroxine (SYNTHROID) 50 MCG tablet Take 50 mcg by mouth daily before breakfast. 08/04/21   [provider]   mupirocin ointment (BACTROBAN) 2 % Apply 1 application. topically 2 (two) times daily. 07/08/21   Volney American, PA-C  omeprazole (PRILOSEC OTC) 20 MG tablet Take 20 mg by mouth daily as needed (acid reflux).    [provider]  oxyCODONE (OXY IR/ROXICODONE) 5 MG immediate release tablet Take 5 mg by mouth every 6 (six) hours as needed for moderate pain. 01/26/21   [provider]      Allergies    Nsaids, Alendronate sodium, Augmentin [amoxicillin-pot clavulanate], Ezetimibe-simvastatin, and Statins    Review of Systems   Review of Systems  Neurological:  Positive for dizziness.  See HPI  Physical Exam Updated Vital Signs BP (!) 181/96   Pulse 66   Temp 98.1 F (36.7 C) (Oral)   Resp 20   Ht '5\' 4"'$  (1.626 m)   Wt 86.2 kg   SpO2 95%   BMI 32.61 kg/m  Physical Exam Vitals and nursing note reviewed.  Constitutional:      General: She is not in acute distress.    Appearance: She is well-developed.  HENT:     Head: Normocephalic and atraumatic.  Eyes:     Extraocular Movements: Extraocular movements intact.     Conjunctiva/sclera: Conjunctivae normal.     Pupils: Pupils are equal, round, and reactive to light.  Neck:     Vascular: No carotid bruit.  Cardiovascular:     Rate and Rhythm: Regular rhythm. Bradycardia present.     Pulses: Normal pulses.  Heart sounds: Normal heart sounds. No murmur heard. Pulmonary:     Effort: Pulmonary effort is normal. No respiratory distress.     Breath sounds: Normal breath sounds.  Abdominal:     General: Bowel sounds are normal.     Palpations: Abdomen is soft.     Tenderness: There is no abdominal tenderness. There is no guarding.  Musculoskeletal:        General: No swelling.     Cervical back: Normal range of motion and neck supple. No tenderness.  Skin:    General: Skin is warm and dry.     Capillary Refill: Capillary refill takes less than 2 seconds.  Neurological:     General: No focal deficit  present.     Mental Status: She is alert and oriented to person, place, and time. Mental status is at baseline.     Cranial Nerves: Cranial nerves 2-12 are intact.     Sensory: Sensation is intact.  Psychiatric:        Mood and Affect: Mood normal.     ED Results / Procedures / Treatments   Labs (all labs ordered are listed, but only abnormal results are displayed) Labs Reviewed  BASIC METABOLIC PANEL - Abnormal; Notable for the following components:      Result Value   Glucose, Bld 131 (*)    All other components within normal limits  CBC - Abnormal; Notable for the following components:   Hemoglobin 15.1 (*)    HCT 46.3 (*)    All other components within normal limits  URINALYSIS, ROUTINE W REFLEX MICROSCOPIC - Abnormal; Notable for the following components:   Color, Urine STRAW (*)    Hgb urine dipstick SMALL (*)    All other components within normal limits  CBG MONITORING, ED - Abnormal; Notable for the following components:   Glucose-Capillary 155 (*)    All other components within normal limits  CBG MONITORING, ED  TROPONIN I (HIGH SENSITIVITY)  TROPONIN I (HIGH SENSITIVITY)    EKG EKG Interpretation  Date/Time:  Friday March 30 2022 13:53:56 EST Ventricular Rate:  72 PR Interval:  170 QRS Duration: 82 QT Interval:  388 QTC Calculation: 424 R Axis:   55 Text Interpretation: Normal sinus rhythm with sinus arrhythmia Normal ECG When compared with ECG of 17-Aug-2021 11:38, Criteria for Septal infarct are no longer Present Confirmed by Noemi Chapel [phone]) on 03/30/2022 6:50:51 PM  Radiology CT Head Wo Contrast  Result Date: 03/30/2022 CLINICAL DATA:  Lightheadedness, shortness of breath, hypertension EXAM: CT HEAD WITHOUT CONTRAST TECHNIQUE: Contiguous axial images were obtained from the base of the skull through the vertex without intravenous contrast. RADIATION DOSE REDUCTION: This exam was performed according to the departmental dose-optimization program which  includes automated exposure control, adjustment of the mA and/or kV according to patient size and/or use of iterative reconstruction technique. COMPARISON:  06/15/2009 FINDINGS: Brain: There hypodensities within the bilateral periventricular and subcortical white matter, greatest in the frontal lobes, consistent with age-indeterminate small vessel ischemic change. No other signs of acute infarct or hemorrhage. The lateral ventricles and midline structures are otherwise unremarkable. No acute extra-axial fluid collections. No mass effect. Vascular: No hyperdense vessel or unexpected calcification. Skull: Normal. Negative for fracture or focal lesion. Sinuses/Orbits: No acute finding. Other: None. IMPRESSION: 1. Age-indeterminate small-vessel ischemic changes throughout the frontal white matter, likely chronic. 2. Otherwise no acute intracranial process. Electronically Signed   By: Randa Ngo M.D.   On: 03/30/2022 19:44  Procedures Procedures    Medications Ordered in ED Medications - No data to display  ED Course/ Medical Decision Making/ A&P                             Medical Decision Making Amount and/or Complexity of Data Reviewed Labs: ordered. Radiology: ordered.  Risk Prescription drug management.   Benjamine Mola 77 y.o. presented today for dizziness. Working DDx that I considered at this time includes, but not limited to, ACS, CVA/TIA, HTN urgency/emergency, SAH, orthostatic HoTN, GIB.  Review of prior external notes: 08/17/21 Cardiology CV Procedure  Unique Tests and My Interpretation: CBG: 92 Trop: unremarkable CBC: unremarkable BMP: unremarkable UA: Unremarkable CT Head wo contrast: Chronic signs of ischemia however no acute changes  Discussion with Independent Historian: Daughter-in-law  Discussion of Management of Tests: none  Risk: Medium: prescription drug management   Risk Stratification Score: none  Staffed with Noemi Chapel, MD  R/o  DDx: CVA/TIA: CT was negative for any acute changes HTN urgency/emergency: patient showed no signs of end organ dysfunction SAH: CT was negative ACS: trops negative and EKG negative for signs of ischemia UTI: UA was negative BPPV: vertigo is not positional GIB: patient is not anemia and denied any history of hematemesis or melena  Plan: Patient appeared with dizziness that began this morning when she was getting up out of bed.  Patient has been asymptomatic throughout ED course with hypertension up to 181/96 and bradycardic to 55.  Patient is not currently on any blood pressure medications and states she has never had blood pressure problems in the past.  I suspect symptoms are related to elevated blood pressure today and positional due to symptoms began after standing up from lying down.  Patient has had a reassuring workup and physical exam that would not suggest any life-threatening illnesses at this time. Patient's HR has improved to 66. Patient will be encouraged to follow-up with PCP and will be discharged with lisinopril as her blood pressure is hypertensive at 181/96.  Patient's blood pressure was 9 prior done in ED due to risk of causing ischemic stroke.  Patient was educated on side effects of lisinopril.  Patient verbalized agreement to this plan.         Final Clinical Impression(s) / ED Diagnoses Final diagnoses:  Dizziness  Hypertension, unspecified type    Rx / DC Orders ED Discharge Orders          Ordered    lisinopril (ZESTRIL) 5 MG tablet  Daily        03/30/22 2024              Chuck Hint, PA-C 03/30/22 2027    Chuck Hint, PA-C 03/30/22 2142    Noemi Chapel, MD 03/31/22 1445

## 2022-03-31 DIAGNOSIS — Z6832 Body mass index (BMI) 32.0-32.9, adult: Secondary | ICD-10-CM | POA: Diagnosis not present

## 2022-03-31 DIAGNOSIS — U071 COVID-19: Secondary | ICD-10-CM | POA: Diagnosis not present

## 2022-03-31 DIAGNOSIS — E669 Obesity, unspecified: Secondary | ICD-10-CM | POA: Diagnosis not present

## 2022-04-12 DIAGNOSIS — R030 Elevated blood-pressure reading, without diagnosis of hypertension: Secondary | ICD-10-CM | POA: Diagnosis not present

## 2022-04-12 DIAGNOSIS — E6609 Other obesity due to excess calories: Secondary | ICD-10-CM | POA: Diagnosis not present

## 2022-04-12 DIAGNOSIS — Z6831 Body mass index (BMI) 31.0-31.9, adult: Secondary | ICD-10-CM | POA: Diagnosis not present

## 2022-04-12 DIAGNOSIS — I4891 Unspecified atrial fibrillation: Secondary | ICD-10-CM | POA: Diagnosis not present

## 2022-04-12 DIAGNOSIS — U099 Post covid-19 condition, unspecified: Secondary | ICD-10-CM | POA: Diagnosis not present

## 2022-04-19 ENCOUNTER — Telehealth: Payer: Self-pay | Admitting: Cardiology

## 2022-04-19 MED ORDER — LISINOPRIL 5 MG PO TABS: 5.0000 mg | ORAL_TABLET | Freq: Every day | ORAL | 0 refills | Status: DC

## 2022-04-19 NOTE — Telephone Encounter (Signed)
I spoke with patient.She will go back on lisinopril 5 mg qd and record daily BPs and bring to apt on 2/21

## 2022-04-19 NOTE — Telephone Encounter (Signed)
Patient seen in ED 03/30/22 for c/o dizziness. Had negative head CT. She was placed on Lisinopril 5 mg daily for elevated BP. She states she saw Dr.Golding a week later and he told her her BP looked "ok" and she was already on [place] so he had her stop the Lisinopril. She wonders if she needs to go back on it.  Has f/u apt already scheduled for later this month.

## 2022-04-19 NOTE — Telephone Encounter (Signed)
Pt c/o BP issue: STAT if pt c/o blurred vision, one-sided weakness or slurred speech  1. What are your last 5 BP readings? This morning 143/101, 143/105 today, 145/91 yesterday  2. Are you having any other symptoms (ex. Dizziness, headache, blurred vision, passed out)? headache, lightheadedness  3. What is your BP issue? Patient states she has been having high BP

## 2022-04-30 DIAGNOSIS — M19042 Primary osteoarthritis, left hand: Secondary | ICD-10-CM | POA: Diagnosis not present

## 2022-04-30 DIAGNOSIS — M19041 Primary osteoarthritis, right hand: Secondary | ICD-10-CM | POA: Diagnosis not present

## 2022-04-30 DIAGNOSIS — M1812 Unilateral primary osteoarthritis of first carpometacarpal joint, left hand: Secondary | ICD-10-CM | POA: Diagnosis not present

## 2022-05-08 ENCOUNTER — Encounter: Payer: Self-pay | Admitting: Cardiology

## 2022-05-08 DIAGNOSIS — H04123 Dry eye syndrome of bilateral lacrimal glands: Secondary | ICD-10-CM | POA: Diagnosis not present

## 2022-05-08 NOTE — Progress Notes (Unsigned)
Cardiology Office Note  Date: 05/09/2022   ID: Patricia Hodges, Patricia Hodges Nov 01, 1945, MRN [id number]  PCP:  Sharilyn Sites, MD  Cardiologist:  Rozann Lesches, MD Electrophysiologist:  None   Chief Complaint  Patient presents with   Cardiac follow-up    History of Present Illness: Patricia Hodges is a 77 y.o. female last seen in August 2023 by Ms. Strader PA-C, I reviewed the note.  She is here for a follow-up visit.  She has had trouble with elevation in blood pressure, was seen in the ER in January, workup reviewed.  She was started on lisinopril 5 mg daily at that time, I asked her to continue it after call to the office with continued evidence of hypertension.  She is otherwise on Cartia XT which is mainly being used for heart rate control with history of atrial fibrillation.  She does not report any palpitations or unusual shortness of breath.  I reviewed her home blood pressure checks, trend looks better although she may eventually need up titration of her lisinopril.  I reviewed her recent lab work as well.  She does not report any spontaneous bleeding problems on Eliquis.  ECG from January showed sinus rhythm.  Past Medical History:  Diagnosis Date   Atrial fibrillation ([place])    a. diagnosed in 05/2021 and underwent successful DCCV in 08/2021   Basal cell carcinoma 04/25/2017   nod- left lower back (CX35FU)   Diverticulosis    Dysphagia    Esophageal reflux    Gastroenteritis, eosinophilic    H pylori ulcer    Hyperlipidemia    Osteoarthritis    S/P colonoscopy 2006   diverticulosis, due for repeat in 2016   S/P endoscopy 2007   Schatzki's Ring   SCC (squamous cell carcinoma) 07/22/2019   Left anterior neck- (cx47f)   Squamous cell carcinoma of skin 02/06/2011   in situ (CX35FU)    Current Outpatient Medications  Medication Sig Dispense Refill   apixaban (ELIQUIS) 5 MG TABS tablet Take 1 tablet (5 mg total) by mouth 2 (two) times daily. 60 tablet 1   CARTIA  XT 120 MG 24 hr capsule Take 1 capsule by mouth once daily 90 capsule 1   chlorhexidine (HIBICLENS) 4 % external liquid Apply topically daily as needed. 120 mL 0   ezetimibe (ZETIA) 10 MG tablet Take 10 mg by mouth daily.     levothyroxine (SYNTHROID) 50 MCG tablet Take 50 mcg by mouth daily before breakfast.     lisinopril (ZESTRIL) 5 MG tablet Take 1 tablet (5 mg total) by mouth daily. 30 tablet 0   mupirocin ointment (BACTROBAN) 2 % Apply 1 application. topically 2 (two) times daily. 22 g 0   omeprazole (PRILOSEC OTC) 20 MG tablet Take 20 mg by mouth daily as needed (acid reflux).     oxyCODONE (OXY IR/ROXICODONE) 5 MG immediate release tablet Take 5 mg by mouth every 6 (six) hours as needed for moderate pain.     No current facility-administered medications for this visit.   Allergies:  Nsaids, Alendronate sodium, Augmentin [amoxicillin-pot clavulanate], Ezetimibe-simvastatin, and Statins   ROS: No dizziness or syncope.  Physical Exam: VS:  BP 138/80   Pulse 70   Ht 5' 4"$  (1.626 m)   Wt 188 lb 9.6 oz (85.5 kg)   SpO2 97%   BMI 32.37 kg/m , BMI Body mass index is 32.37 kg/m.  Wt Readings from Last 3 Encounters:  05/09/22 188 lb 9.6 oz (85.5 kg)  03/30/22 190 lb (86.2 kg)  11/03/21 191 lb 9.6 oz (86.9 kg)    General: Patient appears comfortable at rest. HEENT: Conjunctiva and lids normal. Neck: Supple, no elevated JVP or carotid bruits. Lungs: Clear to auscultation, nonlabored breathing at rest. Cardiac: Regular rate and rhythm, no S3 or significant systolic murmur.  ECG:  An ECG dated 03/30/2022 was personally reviewed today and demonstrated:  Sinus rhythm.  Recent Labwork: 05/27/2021: Magnesium 2.2 09/22/2021: ALT 39; AST 40; TSH 2.531 03/30/2022: BUN 23; Creatinine, Ser 0.84; Hemoglobin 15.1; Platelets 308; Potassium 3.6; Sodium 137   Other Studies Reviewed Today:  Echocardiogram 05/26/2021:  1. Left ventricular ejection fraction, by estimation, is 60 to 65%. The  left  ventricle has normal function. The left ventricle has no regional  wall motion abnormalities. There is mild left ventricular hypertrophy.  Left ventricular diastolic parameters  are indeterminate.   2. Right ventricular systolic function is normal. The right ventricular  size is normal. There is normal pulmonary artery systolic pressure. The  estimated right ventricular systolic pressure is [id number] mmHg.   3. Left atrial size was upper normal.   4. The mitral valve is grossly normal. Trivial mitral valve  regurgitation.   5. The aortic valve is tricuspid. Aortic valve regurgitation is not  visualized. Aortic valve sclerosis is present, with no evidence of aortic  valve stenosis. Aortic valve mean gradient measures 2.0 mmHg.   6. The inferior vena cava is normal in size with greater than 50%  respiratory variability, suggesting right atrial pressure of 3 mmHg.   Assessment and Plan:  1.  Essential hypertension.  Agree with recent initiation of lisinopril 5 mg daily.  She will continue to track blood pressure at home and keep follow-up with PCP.  Recent lab work shows normal renal function and potassium.  2.  Persistent atrial fibrillation with CHA2DS2-VASc score of 4.  She remains in sinus rhythm with no sense of palpitations or increasing shortness of breath in the interim.  Continue observation on Cartia XT and Eliquis.  I reviewed her recent lab work.  She reports no spontaneous bleeding problems.  Medication Adjustments/Labs and Tests Ordered: Current medicines are reviewed at length with the patient today.  Concerns regarding medicines are outlined above.   Tests Ordered: No orders of the defined types were placed in this encounter.   Medication Changes: No orders of the defined types were placed in this encounter.   Disposition:  Follow up  6 months.  Signed, Satira Sark, MD, [facility] 05/09/2022 12:02 PM    [facility]. [address] [place], [place] [address] Phone: [phone]; Fax: [phone]

## 2022-05-09 ENCOUNTER — Ambulatory Visit: Payer: Medicare HMO | Attending: Cardiology | Admitting: Cardiology

## 2022-05-09 ENCOUNTER — Encounter: Payer: Self-pay | Admitting: Cardiology

## 2022-05-09 VITALS — BP 138/80 | HR 70 | Ht 64.0 in | Wt 188.6 lb

## 2022-05-09 DIAGNOSIS — I10 Essential (primary) hypertension: Secondary | ICD-10-CM | POA: Diagnosis not present

## 2022-05-09 DIAGNOSIS — I4819 Other persistent atrial fibrillation: Secondary | ICD-10-CM

## 2022-05-09 NOTE — Patient Instructions (Signed)
Medication Instructions:  Your physician recommends that you continue on your current medications as directed. Please refer to the Current Medication list given to you today.   Labwork: None  Testing/Procedures: None  Follow-Up: Follow up with Dr. McDowell in 6 months.   Any Other Special Instructions Will Be Listed Below (If Applicable).     If you need a refill on your cardiac medications before your next appointment, please call your pharmacy.  

## 2022-05-14 ENCOUNTER — Telehealth: Payer: Self-pay | Admitting: Cardiology

## 2022-05-14 NOTE — Telephone Encounter (Signed)
Pt c/o medication issue:  1. Name of Medication: apixaban (ELIQUIS) 5 MG TABS tablet   2. How are you currently taking this medication (dosage and times per day)?   3. Are you having a reaction (difficulty breathing--STAT)? No   4. What is your medication issue? Patient is calling to check on status of assistance for this medication. Please advise.

## 2022-05-14 NOTE — Telephone Encounter (Signed)
Notified pt that according to [employer] ( Eliquis patient assistance) she has to spend $592.82 in prescription cost. Pt states that she is unable to do this and would like another medicine. Please advise.

## 2022-05-14 NOTE — Telephone Encounter (Signed)
Pt calling back to speak with you

## 2022-05-14 NOTE — Telephone Encounter (Signed)
Returned call to pt and was informed that they told her she would have to meet an out of pocket of $592.82. Pt thankful for the return call.

## 2022-05-16 MED ORDER — APIXABAN 5 MG PO TABS: 5.0000 mg | ORAL_TABLET | Freq: Two times a day (BID) | ORAL | 5 refills | Status: DC

## 2022-05-16 NOTE — Addendum Note (Signed)
Addended by: Malen Gauze on: 05/16/2022 04:35 PM   Modules accepted: Orders

## 2022-05-16 NOTE — Telephone Encounter (Signed)
Spoke to [place], Occupational psychologist, at [facility] who stated that Xarelto 20 mg tablets would be $47 for a 30 day prescription.  Called patient and informed her of price. Pt stated that she had talked to her insurance company and they informed her that her Eliquis would also be $47 for a 30 day prescription, so she just wants to continue the Eliquis at this time. She asked for a new Rx to be sent to [facility] in [place] for Eliquis 5 mg tablets- 30 days at a time.   Will route to provider as FYI and Edrick Oh, RN for new prescription.

## 2022-05-16 NOTE — Telephone Encounter (Signed)
Prescription refill request for Eliquis received. Indication: AF Last office visit: 05/09/22  Myles Gip MD Scr: 0.84 on 03/30/22 Age: 77 Weight: 85.5kg  Based on above findings Eliquis '5mg'$  twice daily is the appropriate dose.  Refill approved.

## 2022-05-22 ENCOUNTER — Other Ambulatory Visit: Payer: Self-pay | Admitting: Cardiology

## 2022-06-01 ENCOUNTER — Other Ambulatory Visit: Payer: Self-pay | Admitting: Cardiology

## 2022-06-11 ENCOUNTER — Other Ambulatory Visit: Payer: Self-pay | Admitting: Cardiology

## 2022-06-11 ENCOUNTER — Telehealth: Payer: Self-pay | Admitting: Cardiology

## 2022-06-11 DIAGNOSIS — L570 Actinic keratosis: Secondary | ICD-10-CM | POA: Diagnosis not present

## 2022-06-11 MED ORDER — LISINOPRIL 5 MG PO TABS: 5.0000 mg | ORAL_TABLET | Freq: Every day | ORAL | 1 refills | Status: DC

## 2022-06-11 MED ORDER — APIXABAN 5 MG PO TABS: 5.0000 mg | ORAL_TABLET | Freq: Two times a day (BID) | ORAL | 5 refills | Status: DC

## 2022-06-11 NOTE — Telephone Encounter (Signed)
Pt c/o medication issue:  1. Name of Medication: apixaban (ELIQUIS) 5 MG TABS tablet ; lisinopril (ZESTRIL) 5 MG tablet   2. How are you currently taking this medication (dosage and times per day)? As written  3. Are you having a reaction (difficulty breathing--STAT)? No   4. What is your medication issue? Patient would like a 90 day supply for both medications sent in to [place], [place] - [address] [place]

## 2022-06-11 NOTE — Telephone Encounter (Signed)
Prescription refill request for Eliquis received. Indication: AF Last office visit: 05/09/22  Myles Gip MD Scr: 0.84 on 03/30/22 Age: 77 Weight: 85.5kg  Based on above findings Eliquis 5mg  twice daily is the appropriate dose.  Refill approved.

## 2022-06-11 NOTE — Telephone Encounter (Signed)
I refilled lisinopril   Will forward Eliquis to L.Joneen Caraway, RN

## 2022-08-08 DIAGNOSIS — E039 Hypothyroidism, unspecified: Secondary | ICD-10-CM | POA: Diagnosis not present

## 2022-08-08 DIAGNOSIS — Z6830 Body mass index (BMI) 30.0-30.9, adult: Secondary | ICD-10-CM | POA: Diagnosis not present

## 2022-08-08 DIAGNOSIS — E785 Hyperlipidemia, unspecified: Secondary | ICD-10-CM | POA: Diagnosis not present

## 2022-08-08 DIAGNOSIS — Z0001 Encounter for general adult medical examination with abnormal findings: Secondary | ICD-10-CM | POA: Diagnosis not present

## 2022-08-08 DIAGNOSIS — I4891 Unspecified atrial fibrillation: Secondary | ICD-10-CM | POA: Diagnosis not present

## 2022-08-08 DIAGNOSIS — E6609 Other obesity due to excess calories: Secondary | ICD-10-CM | POA: Diagnosis not present

## 2022-08-08 DIAGNOSIS — Z1331 Encounter for screening for depression: Secondary | ICD-10-CM | POA: Diagnosis not present

## 2022-08-21 DIAGNOSIS — Z6830 Body mass index (BMI) 30.0-30.9, adult: Secondary | ICD-10-CM | POA: Diagnosis not present

## 2022-08-21 DIAGNOSIS — E6609 Other obesity due to excess calories: Secondary | ICD-10-CM | POA: Diagnosis not present

## 2022-08-21 DIAGNOSIS — M1991 Primary osteoarthritis, unspecified site: Secondary | ICD-10-CM | POA: Diagnosis not present

## 2022-08-21 DIAGNOSIS — D485 Neoplasm of uncertain behavior of skin: Secondary | ICD-10-CM | POA: Diagnosis not present

## 2022-08-21 DIAGNOSIS — I4891 Unspecified atrial fibrillation: Secondary | ICD-10-CM | POA: Diagnosis not present

## 2022-08-21 DIAGNOSIS — E039 Hypothyroidism, unspecified: Secondary | ICD-10-CM | POA: Diagnosis not present

## 2022-08-22 DIAGNOSIS — L570 Actinic keratosis: Secondary | ICD-10-CM | POA: Diagnosis not present

## 2022-08-22 DIAGNOSIS — Z1283 Encounter for screening for malignant neoplasm of skin: Secondary | ICD-10-CM | POA: Diagnosis not present

## 2022-08-22 DIAGNOSIS — D485 Neoplasm of uncertain behavior of skin: Secondary | ICD-10-CM | POA: Diagnosis not present

## 2022-08-22 DIAGNOSIS — C44629 Squamous cell carcinoma of skin of left upper limb, including shoulder: Secondary | ICD-10-CM | POA: Diagnosis not present

## 2022-08-22 DIAGNOSIS — Z85828 Personal history of other malignant neoplasm of skin: Secondary | ICD-10-CM | POA: Diagnosis not present

## 2022-08-30 DIAGNOSIS — C44629 Squamous cell carcinoma of skin of left upper limb, including shoulder: Secondary | ICD-10-CM | POA: Diagnosis not present

## 2022-08-30 DIAGNOSIS — D485 Neoplasm of uncertain behavior of skin: Secondary | ICD-10-CM | POA: Diagnosis not present

## 2022-08-30 DIAGNOSIS — L570 Actinic keratosis: Secondary | ICD-10-CM | POA: Diagnosis not present

## 2022-09-29 DIAGNOSIS — N3001 Acute cystitis with hematuria: Secondary | ICD-10-CM | POA: Diagnosis not present

## 2022-09-30 DIAGNOSIS — M810 Age-related osteoporosis without current pathological fracture: Secondary | ICD-10-CM | POA: Diagnosis not present

## 2022-09-30 DIAGNOSIS — R32 Unspecified urinary incontinence: Secondary | ICD-10-CM | POA: Diagnosis not present

## 2022-09-30 DIAGNOSIS — E039 Hypothyroidism, unspecified: Secondary | ICD-10-CM | POA: Diagnosis not present

## 2022-09-30 DIAGNOSIS — E669 Obesity, unspecified: Secondary | ICD-10-CM | POA: Diagnosis not present

## 2022-09-30 DIAGNOSIS — I2510 Atherosclerotic heart disease of native coronary artery without angina pectoris: Secondary | ICD-10-CM | POA: Diagnosis not present

## 2022-09-30 DIAGNOSIS — M1990 Unspecified osteoarthritis, unspecified site: Secondary | ICD-10-CM | POA: Diagnosis not present

## 2022-09-30 DIAGNOSIS — K219 Gastro-esophageal reflux disease without esophagitis: Secondary | ICD-10-CM | POA: Diagnosis not present

## 2022-09-30 DIAGNOSIS — M545 Low back pain, unspecified: Secondary | ICD-10-CM | POA: Diagnosis not present

## 2022-09-30 DIAGNOSIS — F419 Anxiety disorder, unspecified: Secondary | ICD-10-CM | POA: Diagnosis not present

## 2022-09-30 DIAGNOSIS — E785 Hyperlipidemia, unspecified: Secondary | ICD-10-CM | POA: Diagnosis not present

## 2022-10-07 ENCOUNTER — Other Ambulatory Visit: Payer: Self-pay | Admitting: Cardiology

## 2022-10-10 ENCOUNTER — Other Ambulatory Visit (INDEPENDENT_AMBULATORY_CARE_PROVIDER_SITE_OTHER): Payer: Medicare HMO

## 2022-10-10 ENCOUNTER — Ambulatory Visit (INDEPENDENT_AMBULATORY_CARE_PROVIDER_SITE_OTHER): Payer: Medicare HMO | Admitting: Orthopaedic Surgery

## 2022-10-10 DIAGNOSIS — M25562 Pain in left knee: Secondary | ICD-10-CM | POA: Diagnosis not present

## 2022-10-10 MED ORDER — LIDOCAINE HCL 1 % IJ SOLN
0.5000 mL | INTRAMUSCULAR | Status: AC | PRN
  Administered 2022-10-10: .5 mL

## 2022-10-10 MED ORDER — METHYLPREDNISOLONE ACETATE 40 MG/ML IJ SUSP
40.0000 mg | INTRAMUSCULAR | Status: AC | PRN
  Administered 2022-10-10: 40 mg via INTRA_ARTICULAR

## 2022-10-10 MED ORDER — BUPIVACAINE HCL 0.5 % IJ SOLN
3.0000 mL | INTRAMUSCULAR | Status: AC | PRN
  Administered 2022-10-10: 3 mL via INTRA_ARTICULAR

## 2022-10-10 NOTE — Progress Notes (Signed)
Office Visit Note   Patient: Patricia Hodges           Date of Birth: 10/08/1945           MRN: [id number] Visit Date: 10/10/2022              Requested by: Assunta Found, MD [address] [place],  [place] [address] PCP: Assunta Found, MD   Assessment & Plan: Visit Diagnoses:  1. Acute pain of left knee     Plan: Left knee injection performed.  If she has persistent problems she will let us know or if she develops any locking.  Follow-Up Instructions: Return if symptoms worsen or fail to improve.   Orders:  Orders Placed This Encounter  Procedures   XR KNEE 3 VIEW LEFT   No orders of the defined types were placed in this encounter.     Procedures: Large Joint Inj: L knee on 10/10/2022 10:51 AM Indications: joint swelling and pain Details: 22 G 1.5 in needle, anterolateral approach  Arthrogram: No  Medications: 0.5 mL lidocaine 1 %; 3 mL bupivacaine 0.5 %; 40 mg methylPREDNISolone acetate 40 MG/ML Outcome: tolerated well, no immediate complications Procedure, treatment alternatives, risks and benefits explained, specific risks discussed. Consent was given by the patient. Immediately prior to procedure a time out was called to verify the correct patient, procedure, equipment, support staff and site/side marked as required. Patient was prepped and draped in the usual sterile fashion.       Clinical Data: No additional findings.   Subjective: Chief Complaint  Patient presents with   Left Knee - Pain    DOI 09/25/2022    HPI 77 year old female whose husband just had total knee arthroplasty injured her knee getting on SUV on 09/25/2022 with twisting was persistent pain primarily medial joint line.  No true locking.  She denies groin pain no numbness or tingling in the hands.  She does have osteopenia and had a reaction to Fosamax in the past.  Past history of atrial fibs she is also had some trochanteric bursitis in the past.  No true locking of the knee  problems with the stairs she has to go 1 leg at a time since her twisting injury.  Review of Systems.  Cervical fusion 2017 doing well.  Cardioversion 2023.   Objective: Vital Signs: There were no vitals taken for this visit.  Physical Exam Constitutional:      Appearance: She is well-developed.  HENT:     Head: Normocephalic.     Right Ear: External ear normal.     Left Ear: External ear normal.  Eyes:     Pupils: Pupils are equal, round, and reactive to light.  Neck:     Thyroid: No thyromegaly.     Trachea: No tracheal deviation.  Cardiovascular:     Rate and Rhythm: Normal rate.  Pulmonary:     Effort: Pulmonary effort is normal.  Abdominal:     Palpations: Abdomen is soft.  Skin:    General: Skin is warm and dry.  Neurological:     Mental Status: She is alert and oriented to person, place, and time.  Psychiatric:        Behavior: Behavior normal.     Ortho Exam healed anterior cervical incision.  Mild crepitus knee range of motion medial joint line tenderness no pain with hyperextension ACL PCL exam is stable and normal.  Negative Lachman.  Negative pivot shift.  Negative anterior drawer.  Tenderness along  the medial joint line no lateral joint line tenderness.  No pain with hyperextension.  Specialty Comments:  No specialty comments available.  Imaging: XR KNEE 3 VIEW LEFT  Result Date: 10/10/2022 Standing AP both knees lateral left knee sunrise patella x-ray demonstrates mild knee narrowing.  No malalignment.  No spurring or significant degenerative changes no acute bone changes. Impression: Normal left knee radiographs other than slight symmetrical medial joint line narrowing.    PMFS History: Patient Active Problem List   Diagnosis Date Noted   Atrial fibrillation with RVR (HCC) 05/26/2021   Acute bronchitis 05/26/2021   Hyperlipidemia 05/26/2021   Tendonitis, Achilles, right 07/13/2019   Trochanteric bursitis, left hip 03/26/2019   Lumbar foraminal  stenosis 09/06/2016   Surgery, elective    History of colonic polyps    ABDOMINAL BLOATING 11/11/2009   HELICOBACTER PYLORI GASTRITIS 11/17/2007   SCHATZKI'S RING 11/17/2007   GERD 11/17/2007   HIATAL HERNIA 11/17/2007   EOSINOPHILIC GASTROENTERITIS 11/17/2007   Other spondylosis with radiculopathy, lumbar region 11/17/2007   WEIGHT LOSS 11/17/2007   NAUSEA 11/17/2007   VOMITING 11/17/2007   HEARTBURN 11/17/2007   OTHER DYSPHAGIA 11/17/2007   DIARRHEA 11/17/2007   ABDOMINAL PAIN, HX OF 11/17/2007   CHOLECYSTECTOMY, HX OF 11/17/2007   Past Medical History:  Diagnosis Date   Atrial fibrillation (HCC)    a. diagnosed in 05/2021 and underwent successful DCCV in 08/2021   Basal cell carcinoma 04/25/2017   nod- left lower back (CX35FU)   Diverticulosis    Dysphagia    Esophageal reflux    Gastroenteritis, eosinophilic    H pylori ulcer    Hyperlipidemia    Osteoarthritis    S/P colonoscopy 2006   diverticulosis, due for repeat in 2016   S/P endoscopy 2007   Schatzki's Ring   SCC (squamous cell carcinoma) 07/22/2019   Left anterior neck- (cx60fu)   Squamous cell carcinoma of skin 02/06/2011   in situ (CX35FU)    Family History  Problem Relation Age of Onset   Colon cancer Neg Hx     Past Surgical History:  Procedure Laterality Date   ANTERIOR CERVICAL DECOMP/DISCECTOMY FUSION N/A 01/30/2016   Procedure: C5-6 Anterior Cervical Discectomy and Fusion, Allograft, Plate;  Surgeon: Eldred Manges, MD;  Location: [facility] OR;  Service: Orthopedics;  Laterality: N/A;   bladder tack     BUNIONECTOMY Right    CARDIOVERSION N/A 08/17/2021   Procedure: CARDIOVERSION;  Surgeon: Antoine Poche, MD;  Location: [facility] ORS;  Service: Endoscopy;  Laterality: N/A;   CARPAL TUNNEL RELEASE Left    CATARACT EXTRACTION W/PHACO Right 11/28/2015   Procedure: CATARACT EXTRACTION PHACO AND INTRAOCULAR LENS PLACEMENT (IOC) RIGHT;  Surgeon: Gemma Payor, MD;  Location: [facility] ORS;  Service: Ophthalmology;   Laterality: Right;  CDE: 6.96   CATARACT EXTRACTION W/PHACO Left 12/15/2015   Procedure: CATARACT EXTRACTION PHACO AND INTRAOCULAR LENS PLACEMENT LEFT EYE CDE=9.04;  Surgeon: Gemma Payor, MD;  Location: [facility];  Service: Ophthalmology;  Laterality: Left;  left   CESAREAN SECTION     CHOLECYSTECTOMY     COLONOSCOPY  2006   COLONOSCOPY N/A 09/08/2014   Procedure: COLONOSCOPY;  Surgeon: Corbin Ade, MD;  Location: [facility];  Service: Endoscopy;  Laterality: N/A;  10:30 AM   COLONOSCOPY N/A 11/20/2017   Procedure: COLONOSCOPY;  Surgeon: Corbin Ade, MD;  Location: [facility];  Service: Endoscopy;  Laterality: N/A;  10:30   feet surgery     HAND SURGERY Right  on ring finger due to swollen knuckle   Hx schatski's ring     POLYPECTOMY  11/20/2017   Procedure: POLYPECTOMY;  Surgeon: Corbin Ade, MD;  Location: [facility];  Service: Endoscopy;;  ascending colon (CSx1) descending colon(CSx1)   S/P Hysterectomy     Social History   Occupational History   Not on file  Tobacco Use   Smoking status: Former    Current packs/day: 0.00    Average packs/day: 1 pack/day for 20.0 years (20.0 ttl pk-yrs)    Types: Cigarettes    Start date: 11/23/1958    Quit date: 11/23/1978    Years since quitting: 43.9   Smokeless tobacco: Never   Tobacco comments:    quit about 30 yrs ago  Vaping Use   Vaping status: Never Used  Substance and Sexual Activity   Alcohol use: No   Drug use: No   Sexual activity: Not on file

## 2022-11-14 ENCOUNTER — Ambulatory Visit: Payer: Medicare HMO | Admitting: Cardiology

## 2022-11-15 DIAGNOSIS — Z6829 Body mass index (BMI) 29.0-29.9, adult: Secondary | ICD-10-CM | POA: Diagnosis not present

## 2022-11-15 DIAGNOSIS — E663 Overweight: Secondary | ICD-10-CM | POA: Diagnosis not present

## 2022-11-15 DIAGNOSIS — R159 Full incontinence of feces: Secondary | ICD-10-CM | POA: Diagnosis not present

## 2022-11-15 DIAGNOSIS — N898 Other specified noninflammatory disorders of vagina: Secondary | ICD-10-CM | POA: Diagnosis not present

## 2022-11-15 DIAGNOSIS — N390 Urinary tract infection, site not specified: Secondary | ICD-10-CM | POA: Diagnosis not present

## 2022-11-15 DIAGNOSIS — R195 Other fecal abnormalities: Secondary | ICD-10-CM | POA: Diagnosis not present

## 2022-11-20 ENCOUNTER — Ambulatory Visit: Payer: Medicare HMO | Admitting: Internal Medicine

## 2022-11-23 ENCOUNTER — Encounter: Payer: Self-pay | Admitting: Gastroenterology

## 2022-11-23 ENCOUNTER — Ambulatory Visit (HOSPITAL_COMMUNITY)
Admission: RE | Admit: 2022-11-23 | Discharge: 2022-11-23 | Disposition: A | Discharge status: Home / Self Care | Payer: Medicare HMO | Source: Ambulatory Visit | Attending: Family Medicine | Admitting: Family Medicine

## 2022-11-23 ENCOUNTER — Ambulatory Visit: Payer: Medicare HMO | Admitting: Gastroenterology

## 2022-11-23 ENCOUNTER — Other Ambulatory Visit (HOSPITAL_COMMUNITY): Payer: Self-pay | Admitting: Family Medicine

## 2022-11-23 VITALS — BP 144/85 | HR 57 | Temp 98.0°F | Ht 64.0 in | Wt 178.8 lb

## 2022-11-23 DIAGNOSIS — Z1231 Encounter for screening mammogram for malignant neoplasm of breast: Secondary | ICD-10-CM | POA: Insufficient documentation

## 2022-11-23 DIAGNOSIS — R151 Fecal smearing: Secondary | ICD-10-CM | POA: Diagnosis not present

## 2022-11-23 NOTE — Progress Notes (Signed)
GI Office Note    Referring Provider: Avis Epley, PA* Primary Care Physician:  Ladon Applebaum  Primary Gastroenterologist: Roetta Sessions, MD   Chief Complaint   Chief Complaint  Patient presents with   Encopresis    States that she has to wear a pad at all times due to stool seeping out. States that her bowels are not regular.      History of Present Illness   Patricia Hodges is a 77 y.o. female presenting today at the request of Terie Purser, PA-C for further evaluation of fecal incontinence. She was last seen in our practice in 12/2018.   She presents today with complaints of having 4-5 small stools daily. Mucous in stool. Fecal leakage. She believes this is contributing to recurrent UTIs. Currently on macrobid for UTI. Several months of symptoms. Sometimes use bathroom well, passes a lot of gas. Stools are soft, [place] 5, but still has to strain. Before these symptoms started, she would have a BM once per day. Would have issues with postprandial urgency if eating out. That has been going on for awhile. Stomach gripes a lot. No melena, brbpr. Also has had some fecal incontinence with urgency. No nocturnal stools. She really does not want a colonoscopy unless necessary. Denies heartburn, takes omeprazole OTC only as needed. No dysphagia.   No other medications changes except Cipro several times for UTI. UTIs since 08/2022.   Colonoscopy September 2019: -Diverticulosis -two to 6 mm polyps in the descending colon and descending colon removed, tubular adenomas.  Due to age, no future screening/surveillance colonoscopies recommended.  Colonoscopy recommended only if change in symptoms.    Medications   Current Outpatient Medications  Medication Sig Dispense Refill   apixaban (ELIQUIS) 5 MG TABS tablet Take 1 tablet (5 mg total) by mouth 2 (two) times daily. 60 tablet 5   diltiazem (CARDIZEM CD) 120 MG 24 hr capsule Take 1 capsule by mouth once daily  90 capsule 0   ezetimibe (ZETIA) 10 MG tablet Take 10 mg by mouth daily.     FLUoxetine (PROZAC) 10 MG tablet Take 10 mg by mouth daily.     levothyroxine (SYNTHROID) 50 MCG tablet Take 50 mcg by mouth daily before breakfast.     lisinopril (ZESTRIL) 5 MG tablet Take 1 tablet (5 mg total) by mouth daily. 90 tablet 1   nitrofurantoin, macrocrystal-monohydrate, (MACROBID) 100 MG capsule Take 100 mg by mouth 2 (two) times daily.     omeprazole (PRILOSEC OTC) 20 MG tablet Take 20 mg by mouth daily as needed (acid reflux).     No current facility-administered medications for this visit.    Allergies   Allergies as of 11/23/2022 - Review Complete 11/23/2022  Allergen Reaction Noted   Azithromycin  12/09/2012   Nsaids Nausea And Vomiting 12/09/2012   Raloxifene  12/09/2012   Alendronate sodium Other (See Comments)    Augmentin [amoxicillin-pot clavulanate] Other (See Comments) 01/06/2019   Ezetimibe-simvastatin Other (See Comments)    Statins Other (See Comments) 11/11/2017    Past Medical History   Past Medical History:  Diagnosis Date   Atrial fibrillation (HCC)    a. diagnosed in 05/2021 and underwent successful DCCV in 08/2021   Basal cell carcinoma 04/25/2017   nod- left lower back (CX35FU)   Diverticulosis    Dysphagia    Esophageal reflux    Gastroenteritis, eosinophilic    H pylori ulcer    Hyperlipidemia    Osteoarthritis  S/P colonoscopy 2006   diverticulosis, due for repeat in 2016   S/P endoscopy 2007   Schatzki's Ring   SCC (squamous cell carcinoma) 07/22/2019   Left anterior neck- (cx24fu)   Squamous cell carcinoma of skin 02/06/2011   in situ (CX35FU)    Past Surgical History   Past Surgical History:  Procedure Laterality Date   ANTERIOR CERVICAL DECOMP/DISCECTOMY FUSION N/A 01/30/2016   Procedure: C5-6 Anterior Cervical Discectomy and Fusion, Allograft, Plate;  Surgeon: Eldred Manges, MD;  Location: [facility];  Service: Orthopedics;  Laterality: N/A;    bladder tack     BUNIONECTOMY Right    CARDIOVERSION N/A 08/17/2021   Procedure: CARDIOVERSION;  Surgeon: Antoine Poche, MD;  Location: [facility];  Service: Endoscopy;  Laterality: N/A;   CARPAL TUNNEL RELEASE Left    CATARACT EXTRACTION W/PHACO Right 11/28/2015   Procedure: CATARACT EXTRACTION PHACO AND INTRAOCULAR LENS PLACEMENT (IOC) RIGHT;  Surgeon: Gemma Payor, MD;  Location: [facility];  Service: Ophthalmology;  Laterality: Right;  CDE: 6.96   CATARACT EXTRACTION W/PHACO Left 12/15/2015   Procedure: CATARACT EXTRACTION PHACO AND INTRAOCULAR LENS PLACEMENT LEFT EYE CDE=9.04;  Surgeon: Gemma Payor, MD;  Location: [facility];  Service: Ophthalmology;  Laterality: Left;  left   CESAREAN SECTION     CHOLECYSTECTOMY     COLONOSCOPY  2006   COLONOSCOPY N/A 09/08/2014   Procedure: COLONOSCOPY;  Surgeon: Corbin Ade, MD;  Location: [facility];  Service: Endoscopy;  Laterality: N/A;  10:30 AM   COLONOSCOPY N/A 11/20/2017   Procedure: COLONOSCOPY;  Surgeon: Corbin Ade, MD;  Location: [facility];  Service: Endoscopy;  Laterality: N/A;  10:30   feet surgery     HAND SURGERY Right    on ring finger due to swollen knuckle   Hx schatski's ring     POLYPECTOMY  11/20/2017   Procedure: POLYPECTOMY;  Surgeon: Corbin Ade, MD;  Location: [facility];  Service: Endoscopy;;  ascending colon (CSx1) descending colon(CSx1)   S/P Hysterectomy      Past Family History   Family History  Problem Relation Age of Onset   Colon cancer Neg Hx     Past Social History   Social History   Socioeconomic History   Marital status: Married    Spouse name: Not on file   Number of children: Not on file   Years of education: Not on file   Highest education level: Not on file  Occupational History   Not on file  Tobacco Use   Smoking status: Former    Current packs/day: 0.00    Average packs/day: 1 pack/day for 20.0 years (20.0 ttl pk-yrs)    Types: Cigarettes    Start date: 11/23/1958    Quit date:  11/23/1978    Years since quitting: 44.0   Smokeless tobacco: Never   Tobacco comments:    quit about 30 yrs ago  Vaping Use   Vaping status: Never Used  Substance and Sexual Activity   Alcohol use: No   Drug use: No   Sexual activity: Not on file  Other Topics Concern   Not on file  Social History Narrative   Not on file   Social Determinants of Health   Financial Resource Strain: Not on file  Food Insecurity: Not on file  Transportation Needs: Not on file  Physical Activity: Not on file  Stress: Not on file  Social Connections: Not on file  Intimate Partner Violence: Not on file  Review of Systems   General: Negative for anorexia, weight loss, fever, chills, fatigue, weakness. Eyes: Negative for vision changes.  ENT: Negative for hoarseness, difficulty swallowing , nasal congestion. CV: Negative for chest pain, angina, palpitations, dyspnea on exertion, peripheral edema.  Respiratory: Negative for dyspnea at rest, dyspnea on exertion, cough, sputum, wheezing.  GI: See history of present illness. GU:  Negative for dysuria, hematuria, urinary incontinence, urinary frequency, nocturnal urination.  MS: Negative for joint pain, low back pain.  Derm: Negative for rash or itching.  Neuro: Negative for weakness, abnormal sensation, seizure, frequent headaches, memory loss,  confusion.  Psych: Negative for anxiety, depression, suicidal ideation, hallucinations.  Endo: Negative for unusual weight change.  Heme: Negative for bruising or bleeding. Allergy: Negative for rash or hives.  Physical Exam   BP (!) 144/85 (BP Location: Right Arm, Patient Position: Sitting, Cuff Size: Normal)   Pulse (!) 57   Temp 98 F (36.7 C) (Oral)   Ht 5\' 4"  (1.626 m)   Wt 178 lb 12.8 oz (81.1 kg)   SpO2 97%   BMI 30.69 kg/m    General: Well-nourished, well-developed in no acute distress.  Head: Normocephalic, atraumatic.   Eyes: Conjunctiva pink, no icterus. Mouth: Oropharyngeal mucosa  moist and pink   Neck: Supple without thyromegaly, masses, or lymphadenopathy.  Lungs: Clear to auscultation bilaterally.  Heart: Regular rate and rhythm, no murmurs rubs or gallops.  Abdomen: Bowel sounds are normal, nontender, nondistended, no hepatosplenomegaly or masses,  no abdominal bruits or hernia, no rebound or guarding.   Rectal: no external lesions. Nontender rectal exam. No stool present. No masses present. Somewhat decreases rectal tone Extremities: No lower extremity edema. No clubbing or deformities.  Neuro: Alert and oriented x 4 , grossly normal neurologically.  Skin: Warm and dry, no rash or jaundice.   Psych: Alert and cooperative, normal mood and affect.  Labs   Labs from 07/2022: reviewed under [facility] tab. CBC, CMET, TSH normal.  Imaging Studies   No results found.  Assessment   *Fecal smearing  No rectal abnormalities noted today with except of somewhat decreased anal squeeze. We discussed trying to add fiber, as it seems she has issues with incomplete fecal emptying with multiple small stools daily. This is likely contributing to her fecal smearing in setting of diminished tone.    PLAN   Add benefiber two teaspoons daily for two weeks, then increase to bid if tolerated. She will call if her stool frequency/smearing do not improve.   Leanna Battles. Melvyn Neth, MHS, PA-C [facility]

## 2022-11-23 NOTE — Patient Instructions (Addendum)
Add Benefiber two teaspoons daily for next 2 weeks. If you tolerate it, ie no increased bloating/abdominal pain/diarrhea, then increase to two teaspoons TWICE daily.  If your incontinence and stool frequency do not improve, please call my CMA Tammy at [phone] for further instructions. At that time we would consider adding medication to calm your colon. If all fails, then colonoscopy may be needed.

## 2022-12-03 NOTE — Progress Notes (Unsigned)
Cardiology Office Note    Date:  12/04/2022  ID:  [name], DOB [birth year], MRN [id number] PCP:  Avis Epley, PA-C  Cardiologist:  Nona Dell, MD  Electrophysiologist:  None   Chief Complaint: f/u afib  History of Present Illness: Patricia Hodges is a 77 y.o. female with visit-pertinent history of atrial fibrillation, HLD managed by PCP, GERD with Schatzki's ring, OA, very remote former tobacco use, diverticulosis, dysphagia, eosinophilic gastroenteritis, skin cancer, aortic atherosclerosis by CT who is seen for follow-up DCCV. She was seen by our team in 05/2021 for new onset AFib in the setting of a recent head cold. Echo 05/2021 EF 60-65%, mild LVH, normal PASP -> started on diltiazem + Eliquis. When seen in follow-up 07/2021 was still in afib though asymptomatic. DCCV was performed 08/17/21. I met her in 09/2021 in the setting of some nondescript dizziness, also noting dry cough. Monitor was broken into 2 parts but showed predominantly NSR with rare PACs/PVCs, one brief SVT. Lowest HR occurred during sleeping hours. CXR showed NAD, advised trial of PPI in case GERD was contributing, with recommendation to f/u PCP. Head CT recommended but deferred by patient; obtained during a later ED visit 03/2022 with only chronic microvascular changes. Dizziness resolved spontaneously.  She is seen for follow-up today reporting enthusiastically that she is doing "great" without any CP, SOB, palpitations, syncope or any acute concerns. She is tolerating cardiac meds without adverse effect. She reports PCP is following cholesterol yearly and had her physical in March.   Labwork independently reviewed: 03/2022 trops neg, Hgb 15.1, plt ok, K 3.6, Cr 0.84 09/2021 AST/ALT OK, TSH OK  ROS: .    Please see the history of present illness.  All other systems are reviewed and otherwise negative.  Studies Reviewed: Marland Kitchen    EKG:  EKG is ordered today, personally reviewed, demonstrating  NSR 61bpm, nonspecific STTW changes with TWI more evident in the anterior leads from prior tracing though stable when compared to 2017  CV Studies: Cardiac studies reviewed are outlined and summarized above. Otherwise please see EMR for full report.   Current Reported Medications:.    Current Meds  Medication Sig   apixaban (ELIQUIS) 5 MG TABS tablet Take 1 tablet (5 mg total) by mouth 2 (two) times daily.   diltiazem (CARDIZEM CD) 120 MG 24 hr capsule Take 1 capsule by mouth once daily   ezetimibe (ZETIA) 10 MG tablet Take 10 mg by mouth daily.   FLUoxetine (PROZAC) 10 MG tablet Take 10 mg by mouth daily.   levothyroxine (SYNTHROID) 50 MCG tablet Take 50 mcg by mouth daily before breakfast.   lisinopril (ZESTRIL) 5 MG tablet Take 1 tablet (5 mg total) by mouth daily.   omeprazole (PRILOSEC OTC) 20 MG tablet Take 20 mg by mouth daily as needed (acid reflux).    Physical Exam:    VS:  BP 116/76   Pulse 60   Ht 5\' 4"  (1.626 m)   Wt 175 lb (79.4 kg)   SpO2 97%   BMI 30.04 kg/m    Wt Readings from Last 3 Encounters:  12/04/22 175 lb (79.4 kg)  11/23/22 178 lb 12.8 oz (81.1 kg)  05/09/22 188 lb 9.6 oz (85.5 kg)    GEN: Well nourished, well developed in no acute distress NECK: No JVD; No carotid bruits CARDIAC: RRR, no murmurs, rubs, gallops RESPIRATORY:  Clear to auscultation without rales, wheezing or rhonchi  ABDOMEN: Soft, non-tender, non-distended  EXTREMITIES:  No edema; No acute deformity   Asessement and Plan:.    1. Paroxysmal atrial fibrillation - maintaining NSR on present regimen. Continue diltiazem 120mg  daily and Eliquis 5mg  BID. Update CBC/BMET today to ensure stable for Eliquis dosing.   2. Aortic atherosclerosis - continue lipid management per primary care.  3. Hyperlipidemia - patient reports her lipids are checked every March and she continues with ezetimibe, prescribed by PCP. Continue usual follow-up there. I encouraged her to have their office send [place] a  copy of her next physical's labs for our files.  4. Mild LVH - no acute concerns about this. EKG is mildly abnormal but similar going back to 2017, may reflect changes of LVH. She is asymptomatic and enthusiastically feeling great so we will continue to follow clinically.     Disposition: F/u with Dr. Diona Browner in 6 months, usual interval as previously dictated.  Signed, Laurann Montana, PA-C

## 2022-12-04 ENCOUNTER — Encounter: Payer: Self-pay | Admitting: Physician Assistant

## 2022-12-04 ENCOUNTER — Ambulatory Visit: Payer: Medicare HMO | Attending: Physician Assistant | Admitting: Physician Assistant

## 2022-12-04 ENCOUNTER — Other Ambulatory Visit (HOSPITAL_COMMUNITY)
Admission: RE | Admit: 2022-12-04 | Discharge: 2022-12-04 | Disposition: A | Discharge status: Home / Self Care | Payer: Medicare HMO | Source: Ambulatory Visit | Attending: Physician Assistant | Admitting: Physician Assistant

## 2022-12-04 VITALS — BP 116/76 | HR 60 | Ht 64.0 in | Wt 175.0 lb

## 2022-12-04 DIAGNOSIS — I700 Atherosclerosis of aorta: Secondary | ICD-10-CM

## 2022-12-04 DIAGNOSIS — I480 Paroxysmal atrial fibrillation: Secondary | ICD-10-CM | POA: Insufficient documentation

## 2022-12-04 DIAGNOSIS — I517 Cardiomegaly: Secondary | ICD-10-CM | POA: Diagnosis not present

## 2022-12-04 DIAGNOSIS — E785 Hyperlipidemia, unspecified: Secondary | ICD-10-CM | POA: Diagnosis not present

## 2022-12-04 DIAGNOSIS — Z23 Encounter for immunization: Secondary | ICD-10-CM | POA: Diagnosis not present

## 2022-12-04 LAB — BASIC METABOLIC PANEL
Anion gap: 9 (ref 5–15)
BUN: 17 mg/dL (ref 8–23)
CO2: 25 mmol/L (ref 22–32)
Calcium: 9 mg/dL (ref 8.9–10.3)
Chloride: 105 mmol/L (ref 98–111)
Creatinine, Ser: 0.94 mg/dL (ref 0.44–1.00)
GFR, Estimated: >60 mL/min (ref >60)
Glucose, Bld: 96 mg/dL (ref 70–99)
Potassium: 4.1 mmol/L (ref 3.5–5.1)
Sodium: 139 mmol/L (ref 135–145)

## 2022-12-04 LAB — CBC
HCT: 44 % (ref 36.0–46.0)
Hemoglobin: 14.2 g/dL (ref 12.0–15.0)
MCH: 29.7 pg (ref 26.0–34.0)
MCHC: 32.3 g/dL (ref 30.0–36.0)
MCV: 92.1 fL (ref 80.0–100.0)
Platelets: 261 10*3/uL (ref 150–400)
RBC: 4.78 MIL/uL (ref 3.87–5.11)
RDW: 12.7 % (ref 11.5–15.5)
WBC: 7.3 10*3/uL (ref 4.0–10.5)
nRBC: 0 % (ref 0.0–0.2)

## 2022-12-04 NOTE — Patient Instructions (Signed)
Medication Instructions:  Your physician recommends that you continue on your current medications as directed. Please refer to the Current Medication list given to you today.   Labwork: Bmet ,cbc  Testing/Procedures: None today  Follow-Up: 6 months Dr.McDowell  Any Other Special Instructions Will Be Listed Below (If Applicable).  If you need a refill on your cardiac medications before your next appointment, please call your pharmacy.

## 2022-12-10 ENCOUNTER — Other Ambulatory Visit: Payer: Self-pay | Admitting: Cardiology

## 2022-12-12 DIAGNOSIS — L309 Dermatitis, unspecified: Secondary | ICD-10-CM | POA: Diagnosis not present

## 2022-12-12 DIAGNOSIS — L570 Actinic keratosis: Secondary | ICD-10-CM | POA: Diagnosis not present

## 2022-12-12 DIAGNOSIS — Z85820 Personal history of malignant melanoma of skin: Secondary | ICD-10-CM | POA: Diagnosis not present

## 2022-12-12 DIAGNOSIS — B009 Herpesviral infection, unspecified: Secondary | ICD-10-CM | POA: Diagnosis not present

## 2022-12-17 ENCOUNTER — Other Ambulatory Visit: Payer: Self-pay

## 2022-12-17 ENCOUNTER — Ambulatory Visit
Admission: EM | Admit: 2022-12-17 | Discharge: 2022-12-17 | Disposition: A | Discharge status: Home / Self Care | Payer: Medicare HMO | Attending: Nurse Practitioner | Admitting: Nurse Practitioner

## 2022-12-17 DIAGNOSIS — R42 Dizziness and giddiness: Secondary | ICD-10-CM | POA: Diagnosis not present

## 2022-12-17 LAB — POCT FASTING CBG KUC MANUAL ENTRY: POCT Glucose (KUC): 124 mg/dL — AB (ref 70–99)

## 2022-12-17 MED ORDER — MECLIZINE HCL 12.5 MG PO TABS
12.5000 mg | ORAL_TABLET | Freq: Three times a day (TID) | ORAL | 0 refills | Status: DC | PRN
Start: 2022-12-17 — End: 2023-12-03

## 2022-12-17 NOTE — ED Provider Notes (Signed)
[facility]    CSN: [id number] Arrival date & time: 12/17/22  1031      History   Chief Complaint No chief complaint on file.   HPI Patricia Hodges is a 77 y.o. female.   Patient presents today with a few hours of dizziness and room spinning sensation when she stands up that began when she woke up this morning.  She denies history of similar symptoms, recent head injury, or recent viral symptoms.  Symptoms are not currently worse with moving her head back-and-forth or bending over.  No nausea, vomiting, hearing loss, or headache.  No double vision, blurred vision, postural instability, trouble with speech, trouble swallowing, weakness, pallor, diaphoresis, shortness of breath, or chest pain since it began.  Reports she feels unsteady when she stands but it has gotten better since she has woken up.  No passing out or vision going black.  She had 1 episode of palpitations earlier this morning that is now resolved.    Medical history significant for A-fib, anticoagulated with Eliquis and on Cardizem daily.  Also has history of high blood pressure and hypothyroidism, takes medications as prescribed per her report.    Past Medical History:  Diagnosis Date   Atrial fibrillation (HCC)    a. diagnosed in 05/2021 and underwent successful DCCV in 08/2021   Basal cell carcinoma 04/25/2017   nod- left lower back (CX35FU)   Diverticulosis    Dysphagia    Esophageal reflux    Gastroenteritis, eosinophilic    H pylori ulcer    Hyperlipidemia    Osteoarthritis    S/P colonoscopy 2006   diverticulosis, due for repeat in 2016   S/P endoscopy 2007   Schatzki's Ring   SCC (squamous cell carcinoma) 07/22/2019   Left anterior neck- (cx37fu)   Squamous cell carcinoma of skin 02/06/2011   in situ (CX35FU)    Patient Active Problem List   Diagnosis Date Noted   Fecal smearing 11/23/2022   Atrial fibrillation with RVR (HCC) 05/26/2021   Acute bronchitis 05/26/2021    Hyperlipidemia 05/26/2021   Tendonitis, Achilles, right 07/13/2019   Trochanteric bursitis, left hip 03/26/2019   Lumbar foraminal stenosis 09/06/2016   Surgery, elective    History of colonic polyps    ABDOMINAL BLOATING 11/11/2009   HELICOBACTER PYLORI GASTRITIS 11/17/2007   SCHATZKI'S RING 11/17/2007   GERD 11/17/2007   HIATAL HERNIA 11/17/2007   EOSINOPHILIC GASTROENTERITIS 11/17/2007   Other spondylosis with radiculopathy, lumbar region 11/17/2007   WEIGHT LOSS 11/17/2007   NAUSEA 11/17/2007   VOMITING 11/17/2007   HEARTBURN 11/17/2007   OTHER DYSPHAGIA 11/17/2007   DIARRHEA 11/17/2007   ABDOMINAL PAIN, HX OF 11/17/2007   CHOLECYSTECTOMY, HX OF 11/17/2007    Past Surgical History:  Procedure Laterality Date   ANTERIOR CERVICAL DECOMP/DISCECTOMY FUSION N/A 01/30/2016   Procedure: C5-6 Anterior Cervical Discectomy and Fusion, Allograft, Plate;  Surgeon: Eldred Manges, MD;  Location: [facility];  Service: Orthopedics;  Laterality: N/A;   bladder tack     BUNIONECTOMY Right    CARDIOVERSION N/A 08/17/2021   Procedure: CARDIOVERSION;  Surgeon: Antoine Poche, MD;  Location: [facility];  Service: Endoscopy;  Laterality: N/A;   CARPAL TUNNEL RELEASE Left    CATARACT EXTRACTION W/PHACO Right 11/28/2015   Procedure: CATARACT EXTRACTION PHACO AND INTRAOCULAR LENS PLACEMENT (IOC) RIGHT;  Surgeon: Gemma Payor, MD;  Location: [facility];  Service: Ophthalmology;  Laterality: Right;  CDE: 6.96   CATARACT EXTRACTION W/PHACO Left 12/15/2015   Procedure:  CATARACT EXTRACTION PHACO AND INTRAOCULAR LENS PLACEMENT LEFT EYE CDE=9.04;  Surgeon: Gemma Payor, MD;  Location: [facility];  Service: Ophthalmology;  Laterality: Left;  left   CESAREAN SECTION     CHOLECYSTECTOMY     COLONOSCOPY  2006   COLONOSCOPY N/A 09/08/2014   Procedure: COLONOSCOPY;  Surgeon: Corbin Ade, MD;  Location: [facility];  Service: Endoscopy;  Laterality: N/A;  10:30 AM   COLONOSCOPY N/A 11/20/2017   Procedure: COLONOSCOPY;  Surgeon:  Corbin Ade, MD;  Location: [facility];  Service: Endoscopy;  Laterality: N/A;  10:30   feet surgery     HAND SURGERY Right    on ring finger due to swollen knuckle   Hx schatski's ring     POLYPECTOMY  11/20/2017   Procedure: POLYPECTOMY;  Surgeon: Corbin Ade, MD;  Location: [facility];  Service: Endoscopy;;  ascending colon (CSx1) descending colon(CSx1)   S/P Hysterectomy      OB History   No obstetric history on file.      Home Medications    Prior to Admission medications   Medication Sig Start Date End Date Taking? Authorizing Provider  meclizine (ANTIVERT) 12.5 MG tablet Take 1 tablet (12.5 mg total) by mouth 3 (three) times daily as needed for dizziness. Do not take with alcohol or while driving or operating heavy machinery.  May cause drowsiness. 12/17/22  Yes Valentino Nose, NP  apixaban (ELIQUIS) 5 MG TABS tablet Take 1 tablet (5 mg total) by mouth 2 (two) times daily. 06/11/22   Jonelle Sidle, MD  diltiazem (CARDIZEM CD) 120 MG 24 hr capsule Take 1 capsule by mouth once daily 12/10/22   Jonelle Sidle, MD  ezetimibe (ZETIA) 10 MG tablet Take 10 mg by mouth daily. 05/15/19   [provider]  FLUoxetine (PROZAC) 10 MG tablet Take 10 mg by mouth daily. 08/08/22   [provider]  levothyroxine (SYNTHROID) 50 MCG tablet Take 50 mcg by mouth daily before breakfast. 08/04/21   [provider]  lisinopril (ZESTRIL) 5 MG tablet Take 1 tablet (5 mg total) by mouth daily. 06/11/22   Jonelle Sidle, MD  omeprazole (PRILOSEC OTC) 20 MG tablet Take 20 mg by mouth daily as needed (acid reflux).    [provider]    Family History Family History  Problem Relation Age of Onset   Colon cancer Neg Hx     Social History Social History   Tobacco Use   Smoking status: Former    Current packs/day: 0.00    Average packs/day: 1 pack/day for 20.0 years (20.0 ttl pk-yrs)    Types: Cigarettes    Start date: 11/23/1958    Quit  date: 11/23/1978    Years since quitting: 44.0   Smokeless tobacco: Never   Tobacco comments:    quit about 30 yrs ago  Vaping Use   Vaping status: Never Used  Substance Use Topics   Alcohol use: No   Drug use: No     Allergies   Azithromycin, Nsaids, Raloxifene, Alendronate sodium, Augmentin [amoxicillin-pot clavulanate], Ezetimibe-simvastatin, and Statins   Review of Systems Review of Systems Per HPI  Physical Exam Triage Vital Signs ED Triage Vitals [12/17/22 1052]  Encounter Vitals Group     BP (!) 145/84     Systolic BP Percentile      Diastolic BP Percentile      Pulse Rate (!) 55     Resp 15  Temp 98.5 F (36.9 C)     Temp src      SpO2 97 %     Weight      Height      Head Circumference      Peak Flow      Pain Score 0     Pain Loc      Pain Education      Exclude from Growth Chart    Orthostatic VS for the past 24 hrs:  BP- Lying Pulse- Lying BP- Sitting Pulse- Sitting BP- Standing at 0 minutes Pulse- Standing at 0 minutes  12/17/22 1056 144/77 54 150/85 55 157/85 65    Updated Vital Signs BP (!) 145/84 (BP Location: Right Arm)   Pulse (!) 55   Temp 98.5 F (36.9 C)   Resp 15   SpO2 97%   Visual Acuity Right Eye Distance:   Left Eye Distance:   Bilateral Distance:    Right Eye Near:   Left Eye Near:    Bilateral Near:     Physical Exam Vitals and nursing note reviewed.  Constitutional:      General: She is not in acute distress.    Appearance: Normal appearance. She is not toxic-appearing.  HENT:     Head: Normocephalic and atraumatic.     Nose: Nose normal.     Mouth/Throat:     Mouth: Mucous membranes are moist.     Pharynx: Oropharynx is clear. No posterior oropharyngeal erythema.  Eyes:     General: No scleral icterus.    Extraocular Movements:     Right eye: Normal extraocular motion.     Left eye: Normal extraocular motion.     Pupils: Pupils are equal, round, and reactive to light.  Cardiovascular:     Rate and  Rhythm: Normal rate and regular rhythm.  Pulmonary:     Effort: Pulmonary effort is normal. No respiratory distress.     Breath sounds: Normal breath sounds. No wheezing, rhonchi or rales.  Musculoskeletal:     Cervical back: Normal range of motion.  Lymphadenopathy:     Cervical: No cervical adenopathy.  Skin:    General: Skin is warm and dry.     Capillary Refill: Capillary refill takes less than 2 seconds.     Coloration: Skin is not jaundiced or pale.     Findings: No erythema.  Neurological:     General: No focal deficit present.     Mental Status: She is alert and oriented to person, place, and time.     Cranial Nerves: Cranial nerves 2-12 are intact. No cranial nerve deficit, dysarthria or facial asymmetry.     Sensory: Sensation is intact.     Motor: Motor function is intact. No tremor.     Coordination: Coordination is intact. Heel to [facility] Test normal. Rapid alternating movements normal.     Gait: Gait is intact. Gait normal.      UC Treatments / Results  Labs (all labs ordered are listed, but only abnormal results are displayed) Labs Reviewed  POCT FASTING CBG [facility] MANUAL ENTRY - Abnormal; Notable for the following components:      Result Value   POCT Glucose ([facility]) 124 (*)    All other components within normal limits    EKG   Radiology No results found.  Procedures Procedures (including critical care time)  Medications Ordered in UC Medications - No data to display  Initial Impression / Assessment and Plan / UC Course  I have reviewed the triage vital signs and the nursing notes.  Pertinent labs & imaging results that were available during my care of the patient were reviewed by me and considered in my medical decision making (see chart for details).   Patient is well-appearing, normotensive, afebrile, not tachycardic, not tachypneic, oxygenating well on room air.   Patient is well-appearing, normotensive, afebrile, not tachycardic, not tachypneic,  oxygenating well on room air.    1. Dizziness on standing Overall, vital signs and exam are reassuring EKG shows sinus bradycardia without ST segment or T wave abnormalities that are visualized when compared with previous EKG Orthostatic vital signs are negative for orthostasis CBG is not low Auscultated heart rate is 60 bpm which appears to be near patient's baseline Symptoms are consistent with vertigo, treat with meclizine 12.5 mg Dose lowered due to age Strict ER and return precautions discussed with patient  The patient was given the opportunity to ask questions.  All questions answered to their satisfaction.  The patient is in agreement to this plan.    Final Clinical Impressions(s) / UC Diagnoses   Final diagnoses:  Dizziness on standing     Discharge Instructions      As we discussed, the EKG today looks stable and blood sugar is not low.  Your vital signs with laying, sitting, and standing all looked stable.  You may have had vertigo earlier.  If the room spinning returns, please take the meclizine as prescribed.  If you develop signs of stroke or palpitations, shortness of breath, or you pass out, please seek care emergently.     ED Prescriptions     Medication Sig Dispense Auth. Provider   meclizine (ANTIVERT) 12.5 MG tablet Take 1 tablet (12.5 mg total) by mouth 3 (three) times daily as needed for dizziness. Do not take with alcohol or while driving or operating heavy machinery.  May cause drowsiness. 30 tablet Valentino Nose, NP      PDMP not reviewed this encounter.   Valentino Nose, NP 12/17/22 [phone]

## 2022-12-17 NOTE — ED Triage Notes (Signed)
Pt c/o elevated BP with dizziness, since this morning, feels "out of whack", when standing feels like she will fall.

## 2022-12-17 NOTE — Discharge Instructions (Addendum)
As we discussed, the EKG today looks stable and blood sugar is not low.  Your vital signs with laying, sitting, and standing all looked stable.  You may have had vertigo earlier.  If the room spinning returns, please take the meclizine as prescribed.  If you develop signs of stroke or palpitations, shortness of breath, or you pass out, please seek care emergently.

## 2022-12-24 DIAGNOSIS — M1812 Unilateral primary osteoarthritis of first carpometacarpal joint, left hand: Secondary | ICD-10-CM | POA: Diagnosis not present

## 2022-12-26 ENCOUNTER — Ambulatory Visit: Payer: Medicare HMO | Admitting: Urology

## 2022-12-31 ENCOUNTER — Ambulatory Visit: Payer: Medicare HMO | Admitting: Physician Assistant

## 2023-01-11 ENCOUNTER — Telehealth: Payer: Self-pay | Admitting: Cardiology

## 2023-01-11 MED ORDER — APIXABAN 5 MG PO TABS: 5.0000 mg | ORAL_TABLET | Freq: Two times a day (BID) | ORAL | 0 refills | Status: DC

## 2023-01-11 NOTE — Telephone Encounter (Signed)
Pt c/o medication issue:  1. Name of Medication:   apixaban (ELIQUIS) 5 MG TABS tablet    2. How are you currently taking this medication (dosage and times per day)?   Take 1 tablet (5 mg total) by mouth 2 (two) times daily.    3. Are you having a reaction (difficulty breathing--STAT)? No   4. What is your medication issue? Pt states that the above medication has gone up in price and she needs pt assistance. Please advise

## 2023-01-11 NOTE — Telephone Encounter (Signed)
Samples eliquis 5 mg # 28, lot [id number],EXP 03/2024, pt aware medication will be at front desk.  Patient states she was turned down for patient assistance as her income was too high.   PharmD-any thoughts?

## 2023-01-14 ENCOUNTER — Telehealth: Payer: Self-pay | Admitting: Cardiology

## 2023-01-14 NOTE — Telephone Encounter (Signed)
Patient informed and verbalized understanding of plan. Patient will call her insurance company and then decide on her options. Patient says she will call office back once she decides on her option.

## 2023-01-14 NOTE — Telephone Encounter (Signed)
Patient informed and verbalized understanding of plan. 

## 2023-01-14 NOTE — Telephone Encounter (Signed)
Pt returning nurse's call. Please advise

## 2023-01-14 NOTE — Telephone Encounter (Signed)
If Eliquis copay is cost prohibitive in the donut hole and she was denied from patient assistance, other options are noted below until her plan resets again on January 1:  Xarelto: Similar to Eliquis, but it is taken just once a day. The drug company offers a program called Xarelto with Me Coverage Gap Support. It runs from April 1-December 31. Patients in the coverage gap can get Xarelto for $89 (for 30 days) or $250 (for 90 days). They can call to enroll at: 888-XARELTO [phone])  Dabigatran: Generic of Pradaxa. GoodRx coupon bypasses insurance and brings cost down to ~$60/month if insurance doesn't cover it at a better rate.  Warfarin: Generic and cheap alternative, however it requires frequent in office lab monitoring. It's less effective than the above options and has more food and drug interactions. There may be a copay associated with lab monitoring appts.

## 2023-01-14 NOTE — Telephone Encounter (Signed)
Pt came in office and wanted to know if there is something she can take for sinuses. She stated that the pharmacist told her she could take Claritin but it "gave her a jerk", so she does not want to take that.

## 2023-01-14 NOTE — Telephone Encounter (Signed)
Follow Up:    Patient says she needs to talk to the nurse that she talked to earlier today about her Eliquis and Warfarin please.

## 2023-01-14 NOTE — Telephone Encounter (Addendum)
Contacted but no answer and mail box full.  Will advise that plain mucinex, coricidin HBP or zyrtic would be an option for sinus issues.

## 2023-01-15 NOTE — Telephone Encounter (Signed)
Patient confirmed apt with Misty Stanley Reid,RN next week, 01/24/23 at 9 am  Address and phone number given to location. Patient states she is familiar with location.

## 2023-01-15 NOTE — Telephone Encounter (Signed)
Patient states she spoke with her insurance and she wants to switch to Warfarin as it will be free for her. Her income was too great for patient assistance .I will message [place] and [facility] Dr.McDowell     Per L.Reid,RN, patient will be seen as a new patient on 01/24/23 at 9 am if MD approves.

## 2023-01-24 ENCOUNTER — Ambulatory Visit: Payer: Medicare HMO | Attending: Cardiology | Admitting: *Deleted

## 2023-01-24 DIAGNOSIS — I4891 Unspecified atrial fibrillation: Secondary | ICD-10-CM

## 2023-01-24 DIAGNOSIS — Z5181 Encounter for therapeutic drug level monitoring: Secondary | ICD-10-CM | POA: Diagnosis not present

## 2023-01-24 LAB — POCT INR: INR: 1.3 — AB (ref 2.0–3.0)

## 2023-01-24 MED ORDER — WARFARIN SODIUM 5 MG PO TABS: 5.0000 mg | ORAL_TABLET | Freq: Every day | ORAL | 3 refills | Status: DC

## 2023-01-24 NOTE — Patient Instructions (Addendum)
Here to change from Eliquis to warfarin Continue Eliquis 5mg  twice daily thru 11/24.  Then overlap Eliquis and warfarin x 3 days (11/22, 11/23, 11/24)  then stop Eliquis and continue warfarin 5mg  1 tablet daily in the evening Recheck on 02/13/23

## 2023-02-04 ENCOUNTER — Telehealth: Payer: Self-pay | Admitting: Cardiology

## 2023-02-04 NOTE — Telephone Encounter (Signed)
Pt c/o medication issue:  1. Name of Medication: Eliquis  2. How are you currently taking this medication (dosage and times per day)? NA  3. Are you having a reaction (difficulty breathing--STAT)? No   4. What is your medication issue? Pt is wanting samples of this medication instead of taking the warfarin

## 2023-02-04 NOTE — Telephone Encounter (Signed)
Sample request for Eliquis received. Indication: PAF Last office visit: 12/04/22  Scr: 0.94 on 12/04/22  Epic Age: 77 Weight: 79.4  Pt has signed up for new insurance take will take effect Jan 25.  I pays more for Eliquis so she wants to stay on Eliquis  instead of changing to warfarin but needs samples till then. Told her we would try to help but could not promise.  Based on above findings Eliquis 5mg  twice daily is the appropriate dose.  OK to provide samples if available.

## 2023-02-05 MED ORDER — APIXABAN 5 MG PO TABS: 5.0000 mg | ORAL_TABLET | Freq: Two times a day (BID) | ORAL | 0 refills | Status: DC

## 2023-02-05 NOTE — Telephone Encounter (Signed)
Pt notified that Eliquis 5 mg samples are available for pick up. Pt voiced understanding

## 2023-02-07 ENCOUNTER — Telehealth: Payer: Self-pay | Admitting: Cardiology

## 2023-02-07 NOTE — Telephone Encounter (Signed)
Pt c/o medication issue:  1. Name of Medication:  warfarin (COUMADIN) 5 MG tablet  2. How are you currently taking this medication (dosage and times per day)?   3. Are you having a reaction (difficulty breathing--STAT)?   4. What is your medication issue?   Patient states she is starting on Warfarin tomorrow and she would like to clarify how to take it.

## 2023-02-07 NOTE — Telephone Encounter (Signed)
Called and spoke to pt who stated her "mind can come and go".   Informed pt to continue Eliquis 5mg  , 1 tablet by mouth, twice daily. LAST DOSE 02/10/2023.   Pt will START taking warfarin 5mg  once daily starting tomorrow. Pt verbalized understanding.

## 2023-02-07 NOTE — Telephone Encounter (Signed)
I will forward to Willis Modena, as she has spoken to patient.

## 2023-02-07 NOTE — Telephone Encounter (Signed)
Called and spoke to pt.   Instructed pt to continue taking Eliquis 1 tablet (5mg ) by mouth twice daily. Last day of taking is 02/10/2023 then STOP taking.   Pt is going to start taking warfarin 1 tablet (5mg ) daily on 02/08/2023. Pt scheduled to come in on 02/13/2023 have INR checked.   Informed pt that the reason why she will overlap the Eliquis and warfarin for 3 days is because warfarin is a slow acting medicine and to keep her protected while her INR is coming up she is to have the 3 day overlap of Eliquis and warfarin.   Pt verbalized understanding.

## 2023-02-07 NOTE — Telephone Encounter (Signed)
Patient called back with further questions regarding her medication instructions.

## 2023-02-13 ENCOUNTER — Ambulatory Visit: Payer: Medicare HMO | Attending: Cardiology | Admitting: *Deleted

## 2023-02-13 DIAGNOSIS — Z5181 Encounter for therapeutic drug level monitoring: Secondary | ICD-10-CM | POA: Diagnosis not present

## 2023-02-13 DIAGNOSIS — I4891 Unspecified atrial fibrillation: Secondary | ICD-10-CM

## 2023-02-13 LAB — POCT INR: INR: 1.4 — AB (ref 2.0–3.0)

## 2023-02-13 MED ORDER — WARFARIN SODIUM 5 MG PO TABS: 5.0000 mg | ORAL_TABLET | Freq: Every day | ORAL | 3 refills | Status: DC

## 2023-02-13 NOTE — Patient Instructions (Signed)
Take warfarin 1 1/2 tablets tonight then increase dose to 1 tablet daily except 1 1/2 tablets on Sundays and Thursdays Recheck in 1 wk

## 2023-02-20 ENCOUNTER — Ambulatory Visit: Payer: Medicare HMO | Attending: Cardiology | Admitting: *Deleted

## 2023-02-20 DIAGNOSIS — Z5181 Encounter for therapeutic drug level monitoring: Secondary | ICD-10-CM | POA: Diagnosis not present

## 2023-02-20 DIAGNOSIS — I4891 Unspecified atrial fibrillation: Secondary | ICD-10-CM | POA: Diagnosis not present

## 2023-02-20 LAB — POCT INR: INR: 2.7 (ref 2.0–3.0)

## 2023-02-20 NOTE — Patient Instructions (Signed)
Continue warfarin 1 tablet daily except 1 1/2 tablets on Sundays and Thursdays Recheck in 2 wks

## 2023-02-25 ENCOUNTER — Telehealth: Payer: Self-pay | Admitting: Cardiology

## 2023-02-25 NOTE — Telephone Encounter (Signed)
patient called to talk with Dr. Diona Browner or nurse regarding her medication

## 2023-02-26 NOTE — Telephone Encounter (Signed)
Reports she dropped her [employer]-PAF application off at the front desk at the [place] office more than one week ago. Advised that message would be sent to [facility] office to follow up on application status. Verbalized understanding.

## 2023-02-27 ENCOUNTER — Other Ambulatory Visit: Payer: Self-pay

## 2023-02-27 NOTE — Telephone Encounter (Signed)
Spoke to [place] with [employer] who stated that prescription portion of application will need to be completed and faxed back. Awaiting prescription portion of application to be faxed to [facility] office.    Patient notified and verbalized understanding.

## 2023-02-27 NOTE — Telephone Encounter (Signed)
Updated prescription faxed back to [employer]. Awaiting determination of approval/denial of Eliquis.

## 2023-03-04 ENCOUNTER — Ambulatory Visit: Payer: Medicare HMO | Attending: Cardiology | Admitting: *Deleted

## 2023-03-04 DIAGNOSIS — Z5181 Encounter for therapeutic drug level monitoring: Secondary | ICD-10-CM | POA: Diagnosis not present

## 2023-03-04 DIAGNOSIS — I4891 Unspecified atrial fibrillation: Secondary | ICD-10-CM | POA: Diagnosis not present

## 2023-03-04 LAB — POCT INR: INR: 2.9 (ref 2.0–3.0)

## 2023-03-04 NOTE — Patient Instructions (Signed)
Continue warfarin 1 tablet daily except 1 1/2 tablets on Sundays and Thursdays Recheck in 4 wks

## 2023-03-14 ENCOUNTER — Ambulatory Visit: Payer: Medicare HMO | Admitting: Orthopaedic Surgery

## 2023-03-28 ENCOUNTER — Telehealth: Payer: Self-pay | Admitting: Cardiology

## 2023-03-28 NOTE — Telephone Encounter (Signed)
 Patricia Hodges from [facility] is requesting to speak with RN Bradly Bienenstock.Patricia Hodges stated they are having issues finding the correct insurance information for the patient.  Please advise.

## 2023-03-29 NOTE — Telephone Encounter (Signed)
 Spoke with [employer] and informed them of the information we have on hand. They stated that that insurance is not active and that pt may not have insurance. [employer] will reach out to the pt d/t having to speak with her on this matter.

## 2023-04-01 ENCOUNTER — Ambulatory Visit: Payer: PPO | Attending: Cardiology | Admitting: *Deleted

## 2023-04-01 DIAGNOSIS — I4891 Unspecified atrial fibrillation: Secondary | ICD-10-CM | POA: Diagnosis not present

## 2023-04-01 DIAGNOSIS — Z5181 Encounter for therapeutic drug level monitoring: Secondary | ICD-10-CM | POA: Diagnosis not present

## 2023-04-01 LAB — POCT INR: INR: 3 (ref 2.0–3.0)

## 2023-04-01 NOTE — Patient Instructions (Signed)
Continue warfarin 1 tablet daily except 1 1/2 tablets on Sundays and Thursdays Recheck in 4 wks

## 2023-04-03 ENCOUNTER — Telehealth: Payer: PPO | Admitting: *Deleted

## 2023-04-03 NOTE — Telephone Encounter (Signed)
Patient returned staff call. 

## 2023-04-03 NOTE — Telephone Encounter (Signed)
 Received a fax stating that pt was denied for Pt assistance d/t not showing an OOP of $838.00 in 2025. Pt will also need to show denial letter from [facility] ( Extra help program at [phone]). Called to inform pt. No answer, voicemail full.

## 2023-04-03 NOTE — Telephone Encounter (Signed)
 Patient notified and stated that she will call extra help program to see about assistance. Pt had no further questions or concerns.

## 2023-04-04 DIAGNOSIS — B355 Tinea imbricata: Secondary | ICD-10-CM | POA: Diagnosis not present

## 2023-04-04 DIAGNOSIS — L570 Actinic keratosis: Secondary | ICD-10-CM | POA: Diagnosis not present

## 2023-04-04 DIAGNOSIS — R208 Other disturbances of skin sensation: Secondary | ICD-10-CM | POA: Diagnosis not present

## 2023-04-04 DIAGNOSIS — L820 Inflamed seborrheic keratosis: Secondary | ICD-10-CM | POA: Diagnosis not present

## 2023-04-04 DIAGNOSIS — X32XXXD Exposure to sunlight, subsequent encounter: Secondary | ICD-10-CM | POA: Diagnosis not present

## 2023-04-26 ENCOUNTER — Other Ambulatory Visit (INDEPENDENT_AMBULATORY_CARE_PROVIDER_SITE_OTHER): Payer: Self-pay

## 2023-04-26 ENCOUNTER — Ambulatory Visit: Payer: PPO | Admitting: Orthopedic Surgery

## 2023-04-26 ENCOUNTER — Encounter: Payer: Self-pay | Admitting: Orthopedic Surgery

## 2023-04-26 DIAGNOSIS — M542 Cervicalgia: Secondary | ICD-10-CM

## 2023-04-26 NOTE — Progress Notes (Signed)
 Office Visit Note   Patient: Patricia Hodges           Date of Birth: 05/09/1945           MRN: [id number] Visit Date: 04/26/2023 Requested by: Leonce Lucie PARAS, PA-C [address] [place],  [place] [address] PCP: Leonce Lucie PARAS DEVONNA  Subjective: Chief Complaint  Patient presents with   Neck - Pain    HPI: Patricia Hodges is a 78 y.o. female who presents to the office reporting 54-month history of atraumatic onset neck pain.  She has had prior neck surgery done in 2017 which was a cervical fusion at C5-6.  Describes radiating pain primarily from the neck giving her headaches and also going into her left ear.  Hard for her to sit and watch television.  Her neck is not particularly stiff.  No definite radiation down below the shoulder.  Tylenol  does not give relief.  She cannot take anti-inflammatories because she is on anticoagulants for atrial fibrillation.  Denies any right-sided symptoms.  All symptoms are on the left..                ROS: All systems reviewed are negative as they relate to the chief complaint within the history of present illness.  Patient denies fevers or chills.  Assessment & Plan: Visit Diagnoses:  1. 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  I do not know if the ear symptoms are related.  She is having some neck symptoms radiating into the shoulder but not below the shoulder.  Symptoms ongoing for 3 months and with history of prior surgery as well as adjacent segment disease on plain radiographs MRI scan is indicated with possible ESI to follow.  Follow-Up Instructions: No follow-ups on file.   Orders:  Orders Placed This Encounter  Procedures   XR Cervical Spine 2 or 3 views   MR Cervical Spine w/o contrast   No orders of the defined types were placed in this encounter.     Procedures: No procedures performed   Clinical Data: No additional findings.  Objective: Vital  Signs: There were no vitals taken for this visit.  Physical Exam:  Constitutional: Patient appears well-developed HEENT:  Head: Normocephalic Eyes:EOM are normal Neck: Normal range of motion Cardiovascular: Normal rate Pulmonary/chest: Effort normal Neurologic: Patient is alert Skin: Skin is warm Psychiatric: Patient has normal mood and affect  Ortho Exam: Ortho exam demonstrates pretty reasonable cervical spine exam range of motion with flexion chin to chest extension about 30 degrees rotation is 50 degrees bilaterally.  Motor or sensory function to the arms intact.  No masses lymphadenopathy or skin changes noted in that shoulder girdle or neck region.  Well-healed surgical incision in the neck.  Patient does describe crepitus and grinding in the neck with range of motion.  Radial pulse intact bilaterally.  Specialty Comments:  No specialty comments available.  Imaging: XR Cervical Spine 2 or 3 views Result Date: 04/26/2023 [facility] lateral radiographs cervical spine reviewed cervical spine fusion at C5-6 has been performed.  No hardware complications and excellent full complete fusion has occurred.  There is moderate degenerative disc disease/adjacent segment disease at C6-7.  No spondylolisthesis or compression fractures.    PMFS History: Patient Active Problem List   Diagnosis Date Noted   Atrial fibrillation (HCC) 01/24/2023   Encounter for therapeutic drug monitoring 01/24/2023   Fecal smearing 11/23/2022   Atrial fibrillation  with RVR (HCC) 05/26/2021   Acute bronchitis 05/26/2021   Hyperlipidemia 05/26/2021   Tendonitis, Achilles, right 07/13/2019   Trochanteric bursitis, left hip 03/26/2019   Lumbar foraminal stenosis 09/06/2016   Surgery, elective    History of colonic polyps    ABDOMINAL BLOATING 11/11/2009   Helicobacter pylori infection 11/17/2007   SCHATZKI'S RING 11/17/2007   GERD 11/17/2007   Diaphragmatic hernia 11/17/2007   EOSINOPHILIC GASTROENTERITIS  11/17/2007   Other spondylosis with radiculopathy, lumbar region 11/17/2007   WEIGHT LOSS 11/17/2007   NAUSEA 11/17/2007   VOMITING 11/17/2007   HEARTBURN 11/17/2007   OTHER DYSPHAGIA 11/17/2007   DIARRHEA 11/17/2007   ABDOMINAL PAIN, HX OF 11/17/2007   CHOLECYSTECTOMY, HX OF 11/17/2007   Past Medical History:  Diagnosis Date   Atrial fibrillation (HCC)    a. diagnosed in 05/2021 and underwent successful DCCV in 08/2021   Basal cell carcinoma 04/25/2017   nod- left lower back (CX35FU)   Diverticulosis    Dysphagia    Esophageal reflux    Gastroenteritis, eosinophilic    H pylori ulcer    Hyperlipidemia    Osteoarthritis    S/P colonoscopy 2006   diverticulosis, due for repeat in 2016   S/P endoscopy 2007   Schatzki's Ring   SCC (squamous cell carcinoma) 07/22/2019   Left anterior neck- (cx61fu)   Squamous cell carcinoma of skin 02/06/2011   in situ (CX35FU)    Family History  Problem Relation Age of Onset   Colon cancer Neg Hx     Past Surgical History:  Procedure Laterality Date   ANTERIOR CERVICAL DECOMP/DISCECTOMY FUSION N/A 01/30/2016   Procedure: C5-6 Anterior Cervical Discectomy and Fusion, Allograft, Plate;  Surgeon: Oneil JAYSON Herald, MD;  Location: [facility];  Service: Orthopedics;  Laterality: N/A;   bladder tack     BUNIONECTOMY Right    CARDIOVERSION N/A 08/17/2021   Procedure: CARDIOVERSION;  Surgeon: Alvan Dorn FALCON, MD;  Location: [facility];  Service: Endoscopy;  Laterality: N/A;   CARPAL TUNNEL RELEASE Left    CATARACT EXTRACTION W/PHACO Right 11/28/2015   Procedure: CATARACT EXTRACTION PHACO AND INTRAOCULAR LENS PLACEMENT (IOC) RIGHT;  Surgeon: Cherene Mania, MD;  Location: [facility];  Service: Ophthalmology;  Laterality: Right;  CDE: 6.96   CATARACT EXTRACTION W/PHACO Left 12/15/2015   Procedure: CATARACT EXTRACTION PHACO AND INTRAOCULAR LENS PLACEMENT LEFT EYE CDE=9.04;  Surgeon: Cherene Mania, MD;  Location: [facility];  Service: Ophthalmology;  Laterality: Left;  left    CESAREAN SECTION     CHOLECYSTECTOMY     COLONOSCOPY  2006   COLONOSCOPY N/A 09/08/2014   Procedure: COLONOSCOPY;  Surgeon: Lamar CHRISTELLA Hollingshead, MD;  [facility]: [facility];  Service: Endoscopy;  Laterality: N/A;  10:30 AM   COLONOSCOPY N/A 11/20/2017   Procedure: COLONOSCOPY;  Surgeon: Hollingshead Lamar CHRISTELLA, MD;  [facility]: [facility];  Service: Endoscopy;  Laterality: N/A;  10:30   feet surgery     HAND SURGERY Right    on ring finger due to swollen knuckle   Hx schatski's ring     POLYPECTOMY  11/20/2017   Procedure: POLYPECTOMY;  Surgeon: Hollingshead Lamar CHRISTELLA, MD;  Location: [facility];  Service: Endoscopy;;  ascending colon (CSx1) descending colon(CSx1)   S/P Hysterectomy     Social History   Occupational History   Not on file  Tobacco Use   Smoking status: Former    Current packs/day: 0.00    Average packs/day: 1 pack/day for 20.0 years (20.0 ttl pk-yrs)  Types: Cigarettes    Start date: 11/23/1958    Quit date: 11/23/1978    Years since quitting: 44.4   Smokeless tobacco: Never   Tobacco comments:    quit about 30 yrs ago  Vaping Use   Vaping status: Never Used  Substance and Sexual Activity   Alcohol use: No   Drug use: No   Sexual activity: Not on file

## 2023-04-29 ENCOUNTER — Ambulatory Visit: Payer: PPO | Attending: Cardiology | Admitting: *Deleted

## 2023-04-29 DIAGNOSIS — Z5181 Encounter for therapeutic drug level monitoring: Secondary | ICD-10-CM

## 2023-04-29 DIAGNOSIS — I4891 Unspecified atrial fibrillation: Secondary | ICD-10-CM | POA: Diagnosis not present

## 2023-04-29 LAB — POCT INR: INR: 2.7 (ref 2.0–3.0)

## 2023-04-29 NOTE — Patient Instructions (Signed)
 Continue warfarin 1 tablet daily except 1 1/2 tablets on Sundays and Thursdays Recheck in 4 wks Pt is going to look into the price of Xarelto/Eliquis  to see if affordable. Pt states she has been having H/A since starting warfarin. She will check with her insurance and let me know.

## 2023-05-10 ENCOUNTER — Ambulatory Visit (HOSPITAL_COMMUNITY)
Admission: RE | Admit: 2023-05-10 | Discharge: 2023-05-10 | Disposition: A | Discharge status: Home / Self Care | Payer: PPO | Source: Ambulatory Visit | Attending: Orthopedic Surgery | Admitting: Orthopedic Surgery

## 2023-05-10 DIAGNOSIS — M4802 Spinal stenosis, cervical region: Secondary | ICD-10-CM | POA: Diagnosis not present

## 2023-05-10 DIAGNOSIS — M542 Cervicalgia: Secondary | ICD-10-CM | POA: Insufficient documentation

## 2023-05-10 DIAGNOSIS — M47812 Spondylosis without myelopathy or radiculopathy, cervical region: Secondary | ICD-10-CM | POA: Diagnosis not present

## 2023-05-10 DIAGNOSIS — M47813 Spondylosis without myelopathy or radiculopathy, cervicothoracic region: Secondary | ICD-10-CM | POA: Diagnosis not present

## 2023-05-10 DIAGNOSIS — M5021 Other cervical disc displacement,  high cervical region: Secondary | ICD-10-CM | POA: Diagnosis not present

## 2023-05-13 DIAGNOSIS — H04123 Dry eye syndrome of bilateral lacrimal glands: Secondary | ICD-10-CM | POA: Diagnosis not present

## 2023-05-24 ENCOUNTER — Encounter: Payer: Self-pay | Admitting: Orthopedic Surgery

## 2023-05-24 ENCOUNTER — Ambulatory Visit: Payer: PPO | Admitting: Orthopedic Surgery

## 2023-05-24 DIAGNOSIS — M542 Cervicalgia: Secondary | ICD-10-CM | POA: Diagnosis not present

## 2023-05-24 NOTE — Progress Notes (Signed)
 Office Visit Note   Patient: Patricia Hodges           Date of Birth: [birth year]           MRN: [id number] Visit Date: 05/24/2023 Requested by: Avis Epley, PA-C [address] [place],  [place] [address] PCP: Ladon Applebaum  Subjective: Chief Complaint  Patient presents with   Neck - Pain    HPI: Patricia Hodges is a 78 y.o. female who presents to the office reporting left-sided neck pain as well as headaches.  She is on Coumadin.  Since she was last seen she has had an MRI scan of the cervical spine.  This does show moderate stenosis at C4-5 which is above a well fused level at C5-6.  Not having too much in terms of discrete radicular symptoms but does have significant headaches..                ROS: All systems reviewed are negative as they relate to the chief complaint within the history of present illness.  Patient denies fevers or chills.  Assessment & Plan: Visit Diagnoses:  1. Neck pain     Plan: Impression is moderate stenosis above prior level of fusion.  I think there is a small chance this could be contributing to her headaches.  Would like for her to see neurology to evaluate these headaches and in the meantime have her see Dr. Alvester Morin so he can evaluate her for the appropriateness of cervical spine injection as a diagnostic and therapeutic tool to help with her headaches.  Follow-Up Instructions: No follow-ups on file.   Orders:  Orders Placed This Encounter  Procedures   Ambulatory referral to Neurology   Ambulatory referral to Physical Medicine Rehab   No orders of the defined types were placed in this encounter.     Procedures: No procedures performed   Clinical Data: No additional findings.  Objective: Vital Signs: There were no vitals taken for this visit.  Physical Exam:  Constitutional: Patient appears well-developed HEENT:  Head: Normocephalic Eyes:EOM are normal Neck: Normal range of motion Cardiovascular:  Normal rate Pulmonary/chest: Effort normal Neurologic: Patient is alert Skin: Skin is warm Psychiatric: Patient has normal mood and affect  Ortho Exam: Ortho exam demonstrates pretty reasonable cervical spine range of motion.  5 out of 5 grip EPL FPL interosseous are/extension bicep triceps and deltoid strength.  No definite paresthesias C5-T1 radial pulse intact bilaterally.  Does have some tenderness and muscle spasm in the strap muscles of the neck on the left but not the right.  Specialty Comments:  No specialty comments available.  Imaging: No results found.   PMFS History: Patient Active Problem List   Diagnosis Date Noted   Atrial fibrillation (HCC) 01/24/2023   Encounter for therapeutic drug monitoring 01/24/2023   Fecal smearing 11/23/2022   Atrial fibrillation with RVR (HCC) 05/26/2021   Acute bronchitis 05/26/2021   Hyperlipidemia 05/26/2021   Tendonitis, Achilles, right 07/13/2019   Trochanteric bursitis, left hip 03/26/2019   Lumbar foraminal stenosis 09/06/2016   Surgery, elective    History of colonic polyps    ABDOMINAL BLOATING 11/11/2009   Helicobacter pylori infection 11/17/2007   SCHATZKI'S RING 11/17/2007   GERD 11/17/2007   Diaphragmatic hernia 11/17/2007   EOSINOPHILIC GASTROENTERITIS 11/17/2007   Other spondylosis with radiculopathy, lumbar region 11/17/2007   WEIGHT LOSS 11/17/2007   NAUSEA 11/17/2007   VOMITING 11/17/2007   HEARTBURN 11/17/2007   OTHER DYSPHAGIA 11/17/2007  DIARRHEA 11/17/2007   ABDOMINAL PAIN, HX OF 11/17/2007   CHOLECYSTECTOMY, HX OF 11/17/2007   Past Medical History:  Diagnosis Date   Atrial fibrillation (HCC)    a. diagnosed in 05/2021 and underwent successful DCCV in 08/2021   Basal cell carcinoma 04/25/2017   nod- left lower back (CX35FU)   Diverticulosis    Dysphagia    Esophageal reflux    Gastroenteritis, eosinophilic    H pylori ulcer    Hyperlipidemia    Osteoarthritis    S/P colonoscopy 2006    diverticulosis, due for repeat in 2016   S/P endoscopy 2007   Schatzki's Ring   SCC (squamous cell carcinoma) 07/22/2019   Left anterior neck- (cx92fu)   Squamous cell carcinoma of skin 02/06/2011   in situ (CX35FU)    Family History  Problem Relation Age of Onset   Colon cancer Neg Hx     Past Surgical History:  Procedure Laterality Date   ANTERIOR CERVICAL DECOMP/DISCECTOMY FUSION N/A 01/30/2016   Procedure: C5-6 Anterior Cervical Discectomy and Fusion, Allograft, Plate;  Surgeon: Eldred Manges, MD;  Location: [facility];  Service: Orthopedics;  Laterality: N/A;   bladder tack     BUNIONECTOMY Right    CARDIOVERSION N/A 08/17/2021   Procedure: CARDIOVERSION;  Surgeon: Antoine Poche, MD;  Location: [facility];  Service: Endoscopy;  Laterality: N/A;   CARPAL TUNNEL RELEASE Left    CATARACT EXTRACTION W/PHACO Right 11/28/2015   Procedure: CATARACT EXTRACTION PHACO AND INTRAOCULAR LENS PLACEMENT (IOC) RIGHT;  Surgeon: Gemma Payor, MD;  Location: [facility];  Service: Ophthalmology;  Laterality: Right;  CDE: 6.96   CATARACT EXTRACTION W/PHACO Left 12/15/2015   Procedure: CATARACT EXTRACTION PHACO AND INTRAOCULAR LENS PLACEMENT LEFT EYE CDE=9.04;  Surgeon: Gemma Payor, MD;  Location: [facility];  Service: Ophthalmology;  Laterality: Left;  left   CESAREAN SECTION     CHOLECYSTECTOMY     COLONOSCOPY  2006   COLONOSCOPY N/A 09/08/2014   Procedure: COLONOSCOPY;  Surgeon: Corbin Ade, MD;  [facility];  Service: Endoscopy;  Laterality: N/A;  10:30 AM   COLONOSCOPY N/A 11/20/2017   Procedure: COLONOSCOPY;  Surgeon: Corbin Ade, MD;  [facility];  Service: Endoscopy;  Laterality: N/A;  10:30   feet surgery     HAND SURGERY Right    on ring finger due to swollen knuckle   Hx schatski's ring     POLYPECTOMY  11/20/2017   Procedure: POLYPECTOMY;  Surgeon: Corbin Ade, MD;  [facility];  Service: Endoscopy;;  ascending colon (CSx1) descending colon(CSx1)   S/P  Hysterectomy     Social History   Occupational History   Not on file  Tobacco Use   Smoking status: Former    Current packs/day: 0.00    Average packs/day: 1 pack/day for 20.0 years (20.0 ttl pk-yrs)    Types: Cigarettes    Start date: 11/23/1958    Quit date: 11/23/1978    Years since quitting: 44.5   Smokeless tobacco: Never   Tobacco comments:    quit about 30 yrs ago  Vaping Use   Vaping status: Never Used  Substance and Sexual Activity   Alcohol use: No   Drug use: No   Sexual activity: Not on file

## 2023-05-27 ENCOUNTER — Ambulatory Visit: Payer: PPO | Attending: Cardiology | Admitting: *Deleted

## 2023-05-27 DIAGNOSIS — Z5181 Encounter for therapeutic drug level monitoring: Secondary | ICD-10-CM

## 2023-05-27 DIAGNOSIS — I4891 Unspecified atrial fibrillation: Secondary | ICD-10-CM | POA: Diagnosis not present

## 2023-05-27 LAB — POCT INR: INR: 1.9 — AB (ref 2.0–3.0)

## 2023-05-27 MED ORDER — APIXABAN 5 MG PO TABS: 5.0000 mg | ORAL_TABLET | Freq: Two times a day (BID) | ORAL | 1 refills | Status: DC

## 2023-05-27 NOTE — Patient Instructions (Signed)
 Take warfarin 1 1/2 tablets tonight then resume 1 tablet daily except 1 1/2 tablets on Sundays and Thursdays Will switch back to Eliquis next Wednesday Recheck in 1 wk

## 2023-05-29 ENCOUNTER — Encounter: Payer: Self-pay | Admitting: Physical Medicine and Rehabilitation

## 2023-05-29 ENCOUNTER — Ambulatory Visit: Admitting: Physical Medicine and Rehabilitation

## 2023-05-29 ENCOUNTER — Telehealth: Payer: Self-pay | Admitting: *Deleted

## 2023-05-29 DIAGNOSIS — M4802 Spinal stenosis, cervical region: Secondary | ICD-10-CM | POA: Diagnosis not present

## 2023-05-29 DIAGNOSIS — M7918 Myalgia, other site: Secondary | ICD-10-CM

## 2023-05-29 DIAGNOSIS — M5412 Radiculopathy, cervical region: Secondary | ICD-10-CM | POA: Diagnosis not present

## 2023-05-29 NOTE — Progress Notes (Unsigned)
 Pain Scale   Average Pain 5 Pain radiating neck to head        +Driver, -BT, -Dye Allergies.

## 2023-05-29 NOTE — Progress Notes (Unsigned)
 Patricia Hodges - 78 y.o. female MRN [id number]  Date of birth: [birth year]  Office Visit Note: Visit Date: 05/29/2023 PCP: Avis Epley, PA-C Referred by: Avis Epley, PA*  Subjective: Chief Complaint  Patient presents with   Neck - Pain   HPI: Patricia Hodges is a 78 y.o. female who comes in today per the request of Dr. Dorene Grebe for evaluation of chronic, worsening and severe left sided neck pain radiating to shoulder and up to head. Also reports chronic issues with headaches. Pain ongoing for several months, worsens with sitting. She describes for her pain as sore and aching sensation, currently rates as 5 out of 10. Some relief of pain with home exercise regimen, rest and use of medications. Does take Oxycodone as needed. No history of formal physical therapy. Recent cervical MRI imaging shows C5-C6 ACDF with solid arthrodesis, there is moderate canal stenosis with moderate bilateral foraminal stenosis, left worse than right at the level of C4-C5. History of C5-C6 ACDF with Dr. Annell Greening in 2017. She was recently evaluated by Dr. August Saucer and referred to [place] for possible cervical epidural steroid injection. She underwent lumbar epidural steroid injection in our office in 2020 that did help to alleviate her pain. Patient denies focal weakness, numbness and tingling. No recent trauma or falls.   Patients course is complicated by atrial fibrillation, currently taking Coumadin.      Review of Systems  Musculoskeletal:  Positive for myalgias and neck pain.  Neurological:  Positive for headaches. Negative for tingling, sensory change, focal weakness and weakness.  All other systems reviewed and are negative.  Otherwise per HPI.  Assessment & Plan: Visit Diagnoses:    ICD-10-CM   1. Radiculopathy, cervical region  M54.12 Ambulatory referral to Physical Medicine Rehab    2. Spinal stenosis of cervical region  M48.02     3. Myofascial pain syndrome  M79.18         Plan: Findings:  Chronic, worsening and severe left sided neck pain radiating to shoulder and up to head. Patient continues to have severe pain at this time despite good conservative therapies such as home exercise regimen, rest and use of medications. Patients clinical presentation and exam are consistent with cervical radiculopathy. I also feel there is a myofascial component contributing to her pain. Myofascial tenderness noted to left sternocleidomastoid and levator scapulae regions upon palpation today. We discussed treatment plan in detail today, next step is to perform diagnostic and hopefully therapeutic left C7-T1 interlaminar epidural steroid injection under fluoroscopic guidance. She is currently taking Coumadin, we will need permission to discontinue prior to injection. If good relief of pain with injection we can repeat this procedure infrequently as needed. I discussed injection procedure with her in detail, she has no questions. I also feel patient would benefit from short course of physical therapy with a focus on manual treatments and dry needling. She would like to hold on PT at this time. No red flag symptoms noted upon exam today.     Meds & Orders: No orders of the defined types were placed in this encounter.   Orders Placed This Encounter  Procedures   Ambulatory referral to Physical Medicine Rehab    Follow-up: Return for Left C7-T1 interlaminar epidural steroid injection.   Procedures: No procedures performed      Clinical History: Narrative & Impression CLINICAL DATA:  Left upper extremity radicular symptoms.  Neck pain   EXAM: MRI CERVICAL SPINE WITHOUT CONTRAST  TECHNIQUE: Multiplanar, multisequence MR imaging of the cervical spine was performed. No intravenous contrast was administered.   COMPARISON:  X-ray 04/26/2023, MRI 12/14/2015   FINDINGS: Alignment: Straightening of the cervical lordosis. Grade 1 anterolisthesis of C7 on T1.   Vertebrae: C5-6  ACDF with solid arthrodesis. No fracture. No evidence of discitis. No marrow replacing bone lesion.   Cord: Normal signal and morphology.   Posterior Fossa, vertebral arteries, paraspinal tissues: Negative.   Disc levels:   C2-C3: Unremarkable.   C3-C4: Mild disc bulge with left greater than right facet and uncovertebral arthropathy. Moderate left and mild right foraminal stenosis. Mild canal stenosis. Minimal interval progression.   C4-C5: Disc osteophyte complex with bilateral facet and uncovertebral arthropathy. Moderate canal stenosis with moderate bilateral foraminal stenosis, left worse than right. Findings have progressed from prior.   C5-C6: ACDF. Bony spurring contributes to mild-moderate right foraminal stenosis. No significant canal stenosis. Improved.   C6-C7: Disc osteophyte complex with bilateral uncovertebral spurring. Mild-moderate bilateral foraminal stenosis. No canal stenosis. Minimal interval progression.   C7-T1: Bilateral facet arthropathy and mild disc uncovering. No canal stenosis. Mild left foraminal stenosis. Minimal interval progression.   IMPRESSION: 1. Multilevel cervical spondylosis, progressed since 2017. 2. Moderate canal stenosis and moderate bilateral foraminal stenosis at C4-5. 3. Mild canal stenosis and moderate left foraminal stenosis at C3-4. 4. Mild-moderate bilateral foraminal stenosis at C6-7. 5. C5-6 ACDF with solid arthrodesis.     Electronically Signed   By: Duanne Guess D.O.   On: 05/24/2023 09:10   She reports that she quit smoking about 44 years ago. Her smoking use included cigarettes. She started smoking about 64 years ago. She has a 20 pack-year smoking history. She has never used smokeless tobacco. No results for input(s): "HGBA1C", "LABURIC" in the last 8760 hours.  Objective:  VS:  HT:    WT:   BMI:     BP:   HR: bpm  TEMP: ( )  RESP:  Physical Exam Vitals and nursing note reviewed.  HENT:     Head:  Normocephalic and atraumatic.     Right Ear: External ear normal.     Left Ear: External ear normal.     Nose: Nose normal.     Mouth/Throat:     Mouth: Mucous membranes are moist.  Eyes:     Extraocular Movements: Extraocular movements intact.  Cardiovascular:     Rate and Rhythm: Normal rate.     Pulses: Normal pulses.  Pulmonary:     Effort: Pulmonary effort is normal.  Abdominal:     General: Abdomen is flat. There is no distension.  Musculoskeletal:        General: Tenderness present.     Cervical back: Tenderness present.     Comments: Discomfort noted with flexion, extension and side-to-side rotation. Patient has good strength in the upper extremities including 5 out of 5 strength in wrist extension, long finger flexion and APB. Shoulder range of motion is full bilaterally without any sign of impingement. There is no atrophy of the hands intrinsically. Sensation intact bilaterally. Myofascial tenderness noted to left sternocleidomastoid and levator scapulae regions. Negative Hoffman's sign. Negative Spurling's sign.     Skin:    General: Skin is warm and dry.     Capillary Refill: Capillary refill takes less than 2 seconds.  Neurological:     General: No focal deficit present.     Mental Status: She is alert and oriented to person, place, and time.  Psychiatric:  Mood and Affect: Mood normal.        Behavior: Behavior normal.     Ortho Exam  Imaging: No results found.  Past Medical/Family/Surgical/Social History: Medications & Allergies reviewed per EMR, new medications updated. Patient Active Problem List   Diagnosis Date Noted   Atrial fibrillation (HCC) 01/24/2023   Encounter for therapeutic drug monitoring 01/24/2023   Fecal smearing 11/23/2022   Atrial fibrillation with RVR (HCC) 05/26/2021   Acute bronchitis 05/26/2021   Hyperlipidemia 05/26/2021   Tendonitis, Achilles, right 07/13/2019   Trochanteric bursitis, left hip 03/26/2019   Lumbar foraminal  stenosis 09/06/2016   Surgery, elective    History of colonic polyps    ABDOMINAL BLOATING 11/11/2009   Helicobacter pylori infection 11/17/2007   SCHATZKI'S RING 11/17/2007   GERD 11/17/2007   Diaphragmatic hernia 11/17/2007   EOSINOPHILIC GASTROENTERITIS 11/17/2007   Other spondylosis with radiculopathy, lumbar region 11/17/2007   WEIGHT LOSS 11/17/2007   NAUSEA 11/17/2007   VOMITING 11/17/2007   HEARTBURN 11/17/2007   OTHER DYSPHAGIA 11/17/2007   DIARRHEA 11/17/2007   ABDOMINAL PAIN, HX OF 11/17/2007   CHOLECYSTECTOMY, HX OF 11/17/2007   Past Medical History:  Diagnosis Date   Atrial fibrillation (HCC)    a. diagnosed in 05/2021 and underwent successful DCCV in 08/2021   Basal cell carcinoma 04/25/2017   nod- left lower back (CX35FU)   Diverticulosis    Dysphagia    Esophageal reflux    Gastroenteritis, eosinophilic    H pylori ulcer    Hyperlipidemia    Osteoarthritis    S/P colonoscopy 2006   diverticulosis, due for repeat in 2016   S/P endoscopy 2007   Schatzki's Ring   SCC (squamous cell carcinoma) 07/22/2019   Left anterior neck- (cx97fu)   Squamous cell carcinoma of skin 02/06/2011   in situ (CX35FU)   Family History  Problem Relation Age of Onset   Colon cancer Neg Hx    Past Surgical History:  Procedure Laterality Date   ANTERIOR CERVICAL DECOMP/DISCECTOMY FUSION N/A 01/30/2016   Procedure: C5-6 Anterior Cervical Discectomy and Fusion, Allograft, Plate;  Surgeon: Eldred Manges, MD;  Location: [facility] OR;  Service: Orthopedics;  Laterality: N/A;   bladder tack     BUNIONECTOMY Right    CARDIOVERSION N/A 08/17/2021   Procedure: CARDIOVERSION;  Surgeon: Antoine Poche, MD;  Location: [facility];  Service: Endoscopy;  Laterality: N/A;   CARPAL TUNNEL RELEASE Left    CATARACT EXTRACTION W/PHACO Right 11/28/2015   Procedure: CATARACT EXTRACTION PHACO AND INTRAOCULAR LENS PLACEMENT (IOC) RIGHT;  Surgeon: Gemma Payor, MD;  Location: [facility];  Service: Ophthalmology;   Laterality: Right;  CDE: 6.96   CATARACT EXTRACTION W/PHACO Left 12/15/2015   Procedure: CATARACT EXTRACTION PHACO AND INTRAOCULAR LENS PLACEMENT LEFT EYE CDE=9.04;  Surgeon: Gemma Payor, MD;  Location: [facility];  Service: Ophthalmology;  Laterality: Left;  left   CESAREAN SECTION     CHOLECYSTECTOMY     COLONOSCOPY  2006   COLONOSCOPY N/A 09/08/2014   Procedure: COLONOSCOPY;  Surgeon: Corbin Ade, MD;  Location: [facility];  Service: Endoscopy;  Laterality: N/A;  10:30 AM   COLONOSCOPY N/A 11/20/2017   Procedure: COLONOSCOPY;  Surgeon: Corbin Ade, MD;  Location: [facility];  Service: Endoscopy;  Laterality: N/A;  10:30   feet surgery     HAND SURGERY Right    on ring finger due to swollen knuckle   Hx schatski's ring     POLYPECTOMY  11/20/2017  Procedure: POLYPECTOMY;  Surgeon: Corbin Ade, MD;  Location: [facility];  Service: Endoscopy;;  ascending colon (CSx1) descending colon(CSx1)   S/P Hysterectomy     Social History   Occupational History   Not on file  Tobacco Use   Smoking status: Former    Current packs/day: 0.00    Average packs/day: 1 pack/day for 20.0 years (20.0 ttl pk-yrs)    Types: Cigarettes    Start date: 11/23/1958    Quit date: 11/23/1978    Years since quitting: 44.5   Smokeless tobacco: Never   Tobacco comments:    quit about 30 yrs ago  Vaping Use   Vaping status: Never Used  Substance and Sexual Activity   Alcohol use: No   Drug use: No   Sexual activity: Not on file

## 2023-05-29 NOTE — Telephone Encounter (Signed)
   Pre-operative Risk Assessment    Patient Name: Patricia Hodges  DOB: [birth year] MRN: [id number]   Date of last office visit: 12/04/22 [name], [facility] Date of next office visit: 07/25/23 DR. McDOWELL   Request for Surgical Clearance    Procedure:   ESI  Date of Surgery:  Clearance TBD                                Surgeon:  DR. Jerel Shepherd Group or Practice Name:  [facility] Phone number:  [phone] Fax number:  [phone]   Type of Clearance Requested:   - Medical  - Pharmacy:  Hold Apixaban (Eliquis) x 2 DAYS PRIOR   Type of Anesthesia:  Not Indicated   Additional requests/questions:    Elpidio Anis   05/29/2023, 4:18 PM

## 2023-05-31 ENCOUNTER — Telehealth: Payer: Self-pay | Admitting: *Deleted

## 2023-05-31 NOTE — Telephone Encounter (Signed)
   Name: Patricia Hodges  DOB: March 19, 1946  MRN: [id number]  Primary Cardiologist: Nona Dell, MD   Preoperative team, please contact this patient and set up a phone call appointment for further preoperative risk assessment. Please obtain consent and complete medication review. Thank you for your help.  I confirm that guidance regarding antiplatelet and oral anticoagulation therapy has been completed and, if necessary, noted below.  Per office protocol, patient can hold Eliquis for 3 days prior to procedure.   Patient will not need bridging with Lovenox (enoxaparin) around procedure.   Note: our protocol is to hold anticoagulation for 3 days for all spinal procedures. Please resume Eliquis as soon as possible postprocedure, at the discretion of the surgeon.    I also confirmed the patient resides in the state of [place]. As per [facility] Medical Board telemedicine laws, the patient must reside in the state in which the provider is licensed.   Joylene Grapes, NP 05/31/2023, 9:11 AM [facility]

## 2023-05-31 NOTE — Telephone Encounter (Signed)
 Patient with diagnosis of atrial fibrillation on Eliquis for anticoagulation.    Procedure:   ESI   Date of Surgery:  Clearance TBD    CHA2DS2-VASc Score = 3   This indicates a 3.2% annual risk of stroke. The patient's score is based upon: CHF History: 0 HTN History: 0 Diabetes History: 0 Stroke History: 0 Vascular Disease History: 0 Age Score: 2 Gender Score: 1    CrCl 61 Platelet count 261  Per office protocol, patient can hold Eliquis for 3 days prior to procedure.   Patient will not need bridging with Lovenox (enoxaparin) around procedure.  Note: our protocol is to hold anticoagulation for 3 days for all spinal procedures.  **This guidance is not considered finalized until pre-operative APP has relayed final recommendations.**

## 2023-05-31 NOTE — Telephone Encounter (Signed)
 Pt has been scheduled tele preop appt 06/06/23. Med rec and consent are done.      Patient Consent for Virtual Visit        Patricia Hodges has provided verbal consent on 05/31/2023 for a virtual visit (video or telephone).   CONSENT FOR VIRTUAL VISIT FOR:  Patricia Hodges  By participating in this virtual visit I agree to the following:  I hereby voluntarily request, consent and authorize [facility] and its employed or contracted physicians, physician assistants, nurse practitioners or other licensed health care professionals (the Practitioner), to provide me with telemedicine health care services (the "Services") as deemed necessary by the treating Practitioner. I acknowledge and consent to receive the Services by the Practitioner via telemedicine. I understand that the telemedicine visit will involve communicating with the Practitioner through live audiovisual communication technology and the disclosure of certain medical information by electronic transmission. I acknowledge that I have been given the opportunity to request an in-person assessment or other available alternative prior to the telemedicine visit and am voluntarily participating in the telemedicine visit.  I understand that I have the right to withhold or withdraw my consent to the use of telemedicine in the course of my care at any time, without affecting my right to future care or treatment, and that the Practitioner or I may terminate the telemedicine visit at any time. I understand that I have the right to inspect all information obtained and/or recorded in the course of the telemedicine visit and may receive copies of available information for a reasonable fee.  I understand that some of the potential risks of receiving the Services via telemedicine include:  Delay or interruption in medical evaluation due to technological equipment failure or disruption; Information transmitted may not be sufficient (e.g.  poor resolution of images) to allow for appropriate medical decision making by the Practitioner; and/or  In rare instances, security protocols could fail, causing a breach of personal health information.  Furthermore, I acknowledge that it is my responsibility to provide information about my medical history, conditions and care that is complete and accurate to the best of my ability. I acknowledge that Practitioner's advice, recommendations, and/or decision may be based on factors not within their control, such as incomplete or inaccurate data provided by me or distortions of diagnostic images or specimens that may result from electronic transmissions. I understand that the practice of medicine is not an exact science and that Practitioner makes no warranties or guarantees regarding treatment outcomes. I acknowledge that a copy of this consent can be made available to me via my patient portal [facility] MyChart), or I can request a printed copy by calling the office of [facility].    I understand that my insurance will be billed for this visit.   I have read or had this consent read to me. I understand the contents of this consent, which adequately explains the benefits and risks of the Services being provided via telemedicine.  I have been provided ample opportunity to ask questions regarding this consent and the Services and have had my questions answered to my satisfaction. I give my informed consent for the services to be provided through the use of telemedicine in my medical care

## 2023-05-31 NOTE — Telephone Encounter (Signed)
 Pt has been scheduled tele preop appt 06/06/23. Med rec and consent are done.

## 2023-06-03 ENCOUNTER — Ambulatory Visit: Admitting: Physical Medicine and Rehabilitation

## 2023-06-05 ENCOUNTER — Ambulatory Visit: Attending: Cardiology | Admitting: *Deleted

## 2023-06-05 DIAGNOSIS — I4891 Unspecified atrial fibrillation: Secondary | ICD-10-CM | POA: Diagnosis not present

## 2023-06-05 DIAGNOSIS — Z5181 Encounter for therapeutic drug level monitoring: Secondary | ICD-10-CM | POA: Diagnosis not present

## 2023-06-05 LAB — POCT INR: INR: 2.8 (ref 2.0–3.0)

## 2023-06-05 NOTE — Patient Instructions (Signed)
 Hold warfarin tonight then start Eliquis 5mg  twice daily on Thursday night.  Having back injection on 05/26/23.  Hold Eliquis 3/24, 3/25, 3/26.  Restart Eliquis night of procedure if OK with MD

## 2023-06-06 ENCOUNTER — Ambulatory Visit: Attending: Cardiovascular Disease

## 2023-06-06 DIAGNOSIS — Z01810 Encounter for preprocedural cardiovascular examination: Secondary | ICD-10-CM

## 2023-06-06 DIAGNOSIS — M79671 Pain in right foot: Secondary | ICD-10-CM | POA: Diagnosis not present

## 2023-06-06 NOTE — Progress Notes (Signed)
 Virtual Visit via Telephone Note   Because of Patricia Hodges co-morbid illnesses, she is at least at moderate risk for complications without adequate follow up.  This format is felt to be most appropriate for this patient at this time.  Due to technical limitations with video connection Web designer), today's appointment will be conducted as an audio only telehealth visit, and [name] verbally agreed to proceed in this manner.   All issues noted in this document were discussed and addressed.  No physical exam could be performed with this format.  Evaluation Performed:  Preoperative cardiovascular risk assessment _____________   Date:  06/06/2023   Patient ID:  Patricia Hodges, DOB [birth year], MRN [id number] Patient Location:  Home Provider location:   Office  Primary Care Provider:  Ladon Applebaum Primary Cardiologist:  Nona Dell, MD  Chief Complaint / Patient Profile  78 y.o. y/o female with a h/o atrial fibrillation, HLD, GERD, OA, diverticulosis, dysphagia, eosinophilic gastroenteritis, skin cancer, aortic atherosclerosis by CT who is pending ESI with Dr. Alvester Morin and presents today for telephonic preoperative cardiovascular risk assessment. History of Present Illness  Patricia Hodges is a 78 y.o. female who presents via audio/video conferencing for a telehealth visit today.  Pt was last seen in cardiology clinic on 12/04/2022 by [name], PA.  At that time Patricia Hodges was doing well.  The patient is now pending procedure as outlined above. Since her last visit, she has remained stable from a  cardiac standpoint.  Today she denies chest pain, shortness of breath, lower extremity edema, fatigue, palpitations, melena, hematuria, hemoptysis, diaphoresis, weakness, presyncope, syncope, orthopnea, and PND.  Past Medical History    Past Medical History:  Diagnosis Date   Atrial fibrillation (HCC)    a. diagnosed in 05/2021 and underwent  successful DCCV in 08/2021   Basal cell carcinoma 04/25/2017   nod- left lower back (CX35FU)   Diverticulosis    Dysphagia    Esophageal reflux    Gastroenteritis, eosinophilic    H pylori ulcer    Hyperlipidemia    Osteoarthritis    S/P colonoscopy 2006   diverticulosis, due for repeat in 2016   S/P endoscopy 2007   Schatzki's Ring   SCC (squamous cell carcinoma) 07/22/2019   Left anterior neck- ([id number])   Squamous cell carcinoma of skin 02/06/2011   in situ (CX35FU)   Past Surgical History:  Procedure Laterality Date   ANTERIOR CERVICAL DECOMP/DISCECTOMY FUSION N/A 01/30/2016   Procedure: C5-6 Anterior Cervical Discectomy and Fusion, Allograft, Plate;  Surgeon: Eldred Manges, MD;  Location: [facility];  Service: Orthopedics;  Laterality: N/A;   bladder tack     BUNIONECTOMY Right    CARDIOVERSION N/A 08/17/2021   Procedure: CARDIOVERSION;  Surgeon: Antoine Poche, MD;  Location: [facility];  Service: Endoscopy;  Laterality: N/A;   CARPAL TUNNEL RELEASE Left    CATARACT EXTRACTION W/PHACO Right 11/28/2015   Procedure: CATARACT EXTRACTION PHACO AND INTRAOCULAR LENS PLACEMENT (IOC) RIGHT;  Surgeon: Gemma Payor, MD;  Location: [facility];  Service: Ophthalmology;  Laterality: Right;  CDE: 6.96   CATARACT EXTRACTION W/PHACO Left 12/15/2015   Procedure: CATARACT EXTRACTION PHACO AND INTRAOCULAR LENS PLACEMENT LEFT EYE CDE=9.04;  Surgeon: Gemma Payor, MD;  Location: [facility];  Service: Ophthalmology;  Laterality: Left;  left   CESAREAN SECTION     CHOLECYSTECTOMY     COLONOSCOPY  2006   COLONOSCOPY N/A 09/08/2014   Procedure: COLONOSCOPY;  Surgeon: Gerrit Friends  Rourk, MD;  Location: [facility];  Service: Endoscopy;  Laterality: N/A;  10:30 AM   COLONOSCOPY N/A 11/20/2017   Procedure: COLONOSCOPY;  Surgeon: Corbin Ade, MD;  Location: [facility];  Service: Endoscopy;  Laterality: N/A;  10:30   feet surgery     HAND SURGERY Right    on ring finger due to swollen knuckle   Hx schatski's ring      POLYPECTOMY  11/20/2017   Procedure: POLYPECTOMY;  Surgeon: Corbin Ade, MD;  Location: [facility];  Service: Endoscopy;;  ascending colon (CSx1) descending colon(CSx1)   S/P Hysterectomy      Allergies  Allergies  Allergen Reactions   Azithromycin     Other Reaction(s): GI Intolerance   Nsaids Nausea And Vomiting    Abdominal pain   Raloxifene     Other Reaction(s): GI Intolerance   Alendronate Sodium Other (See Comments)    Muscle pain    Augmentin [Amoxicillin-Pot Clavulanate] Other (See Comments)    Abdominal pain and chest pains   Ezetimibe-Simvastatin Other (See Comments)    Muscle pain, tolerates ezetimibe by itself    Statins Other (See Comments)    Bones/muscle pain    Home Medications    Prior to Admission medications   Medication Sig Start Date End Date Taking? Authorizing Provider  apixaban (ELIQUIS) 5 MG TABS tablet Take 1 tablet (5 mg total) by mouth 2 (two) times daily. Patient not taking: Reported on 05/31/2023 05/27/23   Jonelle Sidle, MD  diltiazem [facility] CD) 120 MG 24 hr capsule Take 1 capsule by mouth once daily 12/10/22   Jonelle Sidle, MD  ezetimibe (ZETIA) 10 MG tablet Take 10 mg by mouth daily. 05/15/19   [provider]  FLUoxetine (PROZAC) 10 MG tablet Take 10 mg by mouth daily. 08/08/22   [provider]  levothyroxine (SYNTHROID) 50 MCG tablet Take 50 mcg by mouth daily before breakfast. 08/04/21   [provider]  lisinopril (ZESTRIL) 5 MG tablet Take 1 tablet (5 mg total) by mouth daily. 06/11/22   Jonelle Sidle, MD  meclizine (ANTIVERT) 12.5 MG tablet Take 1 tablet (12.5 mg total) by mouth 3 (three) times daily as needed for dizziness. Do not take with alcohol or while driving or operating heavy machinery.  May cause drowsiness. 12/17/22   Valentino Nose, NP  omeprazole (PRILOSEC OTC) 20 MG tablet Take 20 mg by mouth daily as needed (acid reflux).    [provider]    Physical Exam     Vital Signs:  GISELL BUEHRLE does not have vital signs available for review today.  Given telephonic nature of communication, physical exam is limited. AAOx3. NAD. Normal affect.  Speech and respirations are unlabored.  Accessory Clinical Findings    None  Assessment & Plan    1.  Preoperative Cardiovascular Risk Assessment:ESI with Dr. Alvester Morin   Ms. Brickman's perioperative risk of a major cardiac event is 0.4% according to the Revised Cardiac Risk Index (RCRI).  Therefore, she is at low risk for perioperative complications.  Her functional capacity is good at 6.61 METs according to the Duke Activity Status Index (DASI). Recommendations: According to ACC/AHA guidelines, no further cardiovascular testing needed.  The patient may proceed to surgery at acceptable risk.   Antiplatelet and/or Anticoagulation Recommendations: Per office protocol, patient can hold Eliquis for 3 days prior to procedure.   Patient will not need bridging with Lovenox (enoxaparin) around procedure. Note: Our protocol  is to hold anticoagulation for 3 days for all spinal procedures. Please resume Eliquis as soon as possible postprocedure, at the discretion of the surgeon.    The patient was advised that if she develops new symptoms prior to surgery to contact our office to arrange for a follow-up visit, and she verbalized understanding.  A copy of this note will be routed to requesting surgeon.  Time:   Today, I have spent 9 minutes with the patient with telehealth technology discussing medical history, symptoms, and management plan.    Rip Harbour, NP  06/06/2023, 3:41 PM

## 2023-06-13 ENCOUNTER — Other Ambulatory Visit: Payer: Self-pay

## 2023-06-13 ENCOUNTER — Ambulatory Visit: Admitting: Physical Medicine and Rehabilitation

## 2023-06-13 VITALS — BP 134/84 | HR 65

## 2023-06-13 DIAGNOSIS — M5412 Radiculopathy, cervical region: Secondary | ICD-10-CM | POA: Diagnosis not present

## 2023-06-13 MED ORDER — METHYLPREDNISOLONE ACETATE 40 MG/ML IJ SUSP
40.0000 mg | Freq: Once | INTRAMUSCULAR | Status: AC
Start: 2023-06-13 — End: 2023-06-13
  Administered 2023-06-13: 40 mg

## 2023-06-13 NOTE — Procedures (Signed)
 Cervical Epidural Steroid Injection - Interlaminar Approach with Fluoroscopic Guidance  Patient: Patricia Hodges      Date of Birth: 1946/02/23 MRN: [id number] PCP: Avis Epley, PA-C      Visit Date: 06/13/2023   Universal Protocol:    Date/Time: 03/27/259:13 AM  Consent Given By: the patient  Position: PRONE  Additional Comments: Vital signs were monitored before and after the procedure. Patient was prepped and draped in the usual sterile fashion. The correct patient, procedure, and site was verified.   Injection Procedure Details:   Procedure diagnoses: Cervical radiculopathy [M54.12]    Meds Administered:  Meds ordered this encounter  Medications   methylPREDNISolone acetate (DEPO-MEDROL) injection 40 mg     Laterality: Left  Location/Site: C7-T1  Needle: 3.5 in., 20 ga. Tuohy  Needle Placement: Paramedian epidural space  Findings:  -Comments: Excellent flow of contrast into the epidural space.  Procedure Details: Using a paramedian approach from the side mentioned above, the region overlying the inferior lamina was localized under fluoroscopic visualization and the soft tissues overlying this structure were infiltrated with 4 ml. of 1% Lidocaine without Epinephrine. A # 20 gauge, Tuohy needle was inserted into the epidural space using a paramedian approach.  The epidural space was localized using loss of resistance along with contralateral oblique bi-planar fluoroscopic views.  After negative aspirate for air, blood, and CSF, a 2 ml. volume of Isovue-250 was injected into the epidural space and the flow of contrast was observed. Radiographs were obtained for documentation purposes.   The injectate was administered into the level noted above.  Additional Comments:  The patient tolerated the procedure well Dressing: 2 x 2 sterile gauze and Band-Aid    Post-procedure details: Patient was observed during the procedure. Post-procedure instructions  were reviewed.  Patient left the clinic in stable condition.

## 2023-06-13 NOTE — Progress Notes (Signed)
 Pain Scale   Average Pain 8  Patient advised she stopped blood thinner x 3 daysago      +Driver, -BT, -Dye Allergies.

## 2023-06-13 NOTE — Patient Instructions (Signed)

## 2023-06-13 NOTE — Progress Notes (Signed)
 KITZIA Hodges - 78 y.o. female MRN [id number]  Date of birth: May 06, 1945  Office Visit Note: Visit Date: 06/13/2023 PCP: Avis Epley, PA-C Referred by: Avis Epley, PA*  Subjective: Chief Complaint  Patient presents with   Neck - Pain   HPI:  Patricia Hodges is a 78 y.o. female who comes in today at the request of Ellin Goodie, FNP for planned Left C7-T1 Cervical Interlaminar epidural steroid injection with fluoroscopic guidance.  The patient has failed conservative care including home exercise, medications, time and activity modification.  This injection will be diagnostic and hopefully therapeutic.  Please see requesting physician notes for further details and justification.   ROS Otherwise per HPI.  Assessment & Plan: Visit Diagnoses:    ICD-10-CM   1. Cervical radiculopathy  M54.12 XR C-ARM NO REPORT    Epidural Steroid injection    methylPREDNISolone acetate (DEPO-MEDROL) injection 40 mg      Plan: No additional findings.   Meds & Orders:  Meds ordered this encounter  Medications   methylPREDNISolone acetate (DEPO-MEDROL) injection 40 mg    Orders Placed This Encounter  Procedures   XR C-ARM NO REPORT   Epidural Steroid injection    Follow-up: Return if symptoms worsen or fail to improve.   Procedures: No procedures performed  Cervical Epidural Steroid Injection - Interlaminar Approach with Fluoroscopic Guidance  Patient: Patricia Hodges      Date of Birth: October 13, 1945 MRN: [id number] PCP: Avis Epley, PA-C      Visit Date: 06/13/2023   Universal Protocol:    Date/Time: 03/27/259:13 AM  Consent Given By: the patient  Position: PRONE  Additional Comments: Vital signs were monitored before and after the procedure. Patient was prepped and draped in the usual sterile fashion. The correct patient, procedure, and site was verified.   Injection Procedure Details:   Procedure diagnoses: Cervical radiculopathy  [M54.12]    Meds Administered:  Meds ordered this encounter  Medications   methylPREDNISolone acetate (DEPO-MEDROL) injection 40 mg     Laterality: Left  Location/Site: C7-T1  Needle: 3.5 in., 20 ga. Tuohy  Needle Placement: Paramedian epidural space  Findings:  -Comments: Excellent flow of contrast into the epidural space.  Procedure Details: Using a paramedian approach from the side mentioned above, the region overlying the inferior lamina was localized under fluoroscopic visualization and the soft tissues overlying this structure were infiltrated with 4 ml. of 1% Lidocaine without Epinephrine. A # 20 gauge, Tuohy needle was inserted into the epidural space using a paramedian approach.  The epidural space was localized using loss of resistance along with contralateral oblique bi-planar fluoroscopic views.  After negative aspirate for air, blood, and CSF, a 2 ml. volume of Isovue-250 was injected into the epidural space and the flow of contrast was observed. Radiographs were obtained for documentation purposes.   The injectate was administered into the level noted above.  Additional Comments:  The patient tolerated the procedure well Dressing: 2 x 2 sterile gauze and Band-Aid    Post-procedure details: Patient was observed during the procedure. Post-procedure instructions were reviewed.  Patient left the clinic in stable condition.    Clinical History: Narrative & Impression CLINICAL DATA:  Left upper extremity radicular symptoms.  Neck pain   EXAM: MRI CERVICAL SPINE WITHOUT CONTRAST   TECHNIQUE: Multiplanar, multisequence MR imaging of the cervical spine was performed. No intravenous contrast was administered.   COMPARISON:  X-ray 04/26/2023, MRI 12/14/2015   FINDINGS: Alignment: Straightening of the  cervical lordosis. Grade 1 anterolisthesis of C7 on T1.   Vertebrae: C5-6 ACDF with solid arthrodesis. No fracture. No evidence of discitis. No marrow replacing  bone lesion.   Cord: Normal signal and morphology.   Posterior Fossa, vertebral arteries, paraspinal tissues: Negative.   Disc levels:   C2-C3: Unremarkable.   C3-C4: Mild disc bulge with left greater than right facet and uncovertebral arthropathy. Moderate left and mild right foraminal stenosis. Mild canal stenosis. Minimal interval progression.   C4-C5: Disc osteophyte complex with bilateral facet and uncovertebral arthropathy. Moderate canal stenosis with moderate bilateral foraminal stenosis, left worse than right. Findings have progressed from prior.   C5-C6: ACDF. Bony spurring contributes to mild-moderate right foraminal stenosis. No significant canal stenosis. Improved.   C6-C7: Disc osteophyte complex with bilateral uncovertebral spurring. Mild-moderate bilateral foraminal stenosis. No canal stenosis. Minimal interval progression.   C7-T1: Bilateral facet arthropathy and mild disc uncovering. No canal stenosis. Mild left foraminal stenosis. Minimal interval progression.   IMPRESSION: 1. Multilevel cervical spondylosis, progressed since 2017. 2. Moderate canal stenosis and moderate bilateral foraminal stenosis at C4-5. 3. Mild canal stenosis and moderate left foraminal stenosis at C3-4. 4. Mild-moderate bilateral foraminal stenosis at C6-7. 5. C5-6 ACDF with solid arthrodesis.     Electronically Signed   By: Duanne Guess D.O.   On: 05/24/2023 09:10     Objective:  VS:  HT:    WT:   BMI:     BP:134/84  HR:65bpm  TEMP: ( )  RESP:  Physical Exam Vitals and nursing note reviewed.  Constitutional:      General: She is not in acute distress.    Appearance: Normal appearance. She is not ill-appearing.  HENT:     Head: Normocephalic and atraumatic.     Right Ear: External ear normal.     Left Ear: External ear normal.  Eyes:     Extraocular Movements: Extraocular movements intact.  Cardiovascular:     Rate and Rhythm: Normal rate.     Pulses:  Normal pulses.  Musculoskeletal:     Cervical back: Tenderness present. No rigidity.     Right lower leg: No edema.     Left lower leg: No edema.     Comments: Patient has good strength in the upper extremities including 5 out of 5 strength in wrist extension long finger flexion and APB.  There is no atrophy of the hands intrinsically.  There is a negative Hoffmann's test.   Lymphadenopathy:     Cervical: No cervical adenopathy.  Skin:    Findings: No erythema, lesion or rash.  Neurological:     General: No focal deficit present.     Mental Status: She is alert and oriented to person, place, and time.     Sensory: No sensory deficit.     Motor: No weakness or abnormal muscle tone.     Coordination: Coordination normal.  Psychiatric:        Mood and Affect: Mood normal.        Behavior: Behavior normal.      Imaging: XR C-ARM NO REPORT Result Date: 06/13/2023 Please see Notes tab for imaging impression.

## 2023-06-18 DIAGNOSIS — L570 Actinic keratosis: Secondary | ICD-10-CM | POA: Diagnosis not present

## 2023-06-18 DIAGNOSIS — D485 Neoplasm of uncertain behavior of skin: Secondary | ICD-10-CM | POA: Diagnosis not present

## 2023-06-18 DIAGNOSIS — L821 Other seborrheic keratosis: Secondary | ICD-10-CM | POA: Diagnosis not present

## 2023-06-27 DIAGNOSIS — Z6829 Body mass index (BMI) 29.0-29.9, adult: Secondary | ICD-10-CM | POA: Diagnosis not present

## 2023-06-27 DIAGNOSIS — Z20828 Contact with and (suspected) exposure to other viral communicable diseases: Secondary | ICD-10-CM | POA: Diagnosis not present

## 2023-06-27 DIAGNOSIS — R6889 Other general symptoms and signs: Secondary | ICD-10-CM | POA: Diagnosis not present

## 2023-06-27 DIAGNOSIS — M1991 Primary osteoarthritis, unspecified site: Secondary | ICD-10-CM | POA: Diagnosis not present

## 2023-06-27 DIAGNOSIS — J069 Acute upper respiratory infection, unspecified: Secondary | ICD-10-CM | POA: Diagnosis not present

## 2023-06-27 DIAGNOSIS — E663 Overweight: Secondary | ICD-10-CM | POA: Diagnosis not present

## 2023-07-01 ENCOUNTER — Ambulatory Visit (HOSPITAL_COMMUNITY)
Admission: RE | Admit: 2023-07-01 | Discharge: 2023-07-01 | Disposition: A | Discharge status: Home / Self Care | Source: Ambulatory Visit | Attending: Internal Medicine | Admitting: Internal Medicine

## 2023-07-01 ENCOUNTER — Other Ambulatory Visit: Payer: Self-pay

## 2023-07-01 ENCOUNTER — Encounter (HOSPITAL_COMMUNITY): Payer: Self-pay | Admitting: Emergency Medicine

## 2023-07-01 ENCOUNTER — Emergency Department (HOSPITAL_COMMUNITY)

## 2023-07-01 ENCOUNTER — Other Ambulatory Visit (HOSPITAL_COMMUNITY): Payer: Self-pay | Admitting: Internal Medicine

## 2023-07-01 ENCOUNTER — Emergency Department (HOSPITAL_COMMUNITY)
Admission: EM | Admit: 2023-07-01 | Discharge: 2023-07-01 | Disposition: A | Discharge status: Home / Self Care | Attending: Emergency Medicine | Admitting: Emergency Medicine

## 2023-07-01 DIAGNOSIS — R059 Cough, unspecified: Secondary | ICD-10-CM | POA: Diagnosis not present

## 2023-07-01 DIAGNOSIS — R051 Acute cough: Secondary | ICD-10-CM | POA: Insufficient documentation

## 2023-07-01 DIAGNOSIS — J209 Acute bronchitis, unspecified: Secondary | ICD-10-CM | POA: Diagnosis not present

## 2023-07-01 DIAGNOSIS — Z7901 Long term (current) use of anticoagulants: Secondary | ICD-10-CM | POA: Diagnosis not present

## 2023-07-01 DIAGNOSIS — Z6829 Body mass index (BMI) 29.0-29.9, adult: Secondary | ICD-10-CM | POA: Diagnosis not present

## 2023-07-01 DIAGNOSIS — J069 Acute upper respiratory infection, unspecified: Secondary | ICD-10-CM | POA: Diagnosis not present

## 2023-07-01 DIAGNOSIS — B9789 Other viral agents as the cause of diseases classified elsewhere: Secondary | ICD-10-CM | POA: Diagnosis not present

## 2023-07-01 DIAGNOSIS — E663 Overweight: Secondary | ICD-10-CM | POA: Diagnosis not present

## 2023-07-01 DIAGNOSIS — J0100 Acute maxillary sinusitis, unspecified: Secondary | ICD-10-CM | POA: Diagnosis not present

## 2023-07-01 LAB — RESP PANEL BY RT-PCR (RSV, FLU A&B, COVID)  RVPGX2
Influenza A by PCR: NEGATIVE
Influenza B by PCR: NEGATIVE
Resp Syncytial Virus by PCR: NEGATIVE
SARS Coronavirus 2 by RT PCR: NEGATIVE

## 2023-07-01 MED ORDER — GUAIFENESIN-DM 100-10 MG/5ML PO SYRP
15.0000 mL | ORAL_SOLUTION | Freq: Once | ORAL | Status: AC
  Administered 2023-07-01: 15 mL via ORAL
  Filled 2023-07-01: qty 15

## 2023-07-01 MED ORDER — GUAIFENESIN-CODEINE 100-10 MG/5ML PO SOLN: 5.0000 mL | Freq: Two times a day (BID) | ORAL | 0 refills | Status: DC | PRN

## 2023-07-01 MED ORDER — ACETAMINOPHEN 325 MG PO TABS
650.0000 mg | ORAL_TABLET | Freq: Once | ORAL | Status: AC
  Administered 2023-07-01: 650 mg via ORAL
  Filled 2023-07-01: qty 2

## 2023-07-01 MED ORDER — DOXYCYCLINE HYCLATE 100 MG PO CAPS: 100.0000 mg | ORAL_CAPSULE | Freq: Two times a day (BID) | ORAL | 0 refills | Status: DC

## 2023-07-01 MED ORDER — DOXYCYCLINE HYCLATE 100 MG PO TABS
100.0000 mg | ORAL_TABLET | Freq: Once | ORAL | Status: AC
  Administered 2023-07-01: 100 mg via ORAL
  Filled 2023-07-01: qty 1

## 2023-07-01 NOTE — ED Provider Triage Note (Signed)
 Emergency Medicine Provider Triage Evaluation Note  Patricia Hodges , a 78 y.o. female  was evaluated in triage.  Pt complains of cough x 1 wk. Tried tessalon perles x multiple days and "compound" medication x once since getting it today from PCP  Review of Systems  Positive: Cough, congestion Negative: Fever, chills, body aches, N/V/D, sore throat  Physical Exam  BP 135/72 (BP Location: Right Arm)   Pulse 75   Temp 98 F (36.7 C) (Oral)   Resp 17   Ht 5\' 4"  (1.626 m)   Wt 79.4 kg   SpO2 98%   BMI 30.04 kg/m  Gen:   Awake, no distress   Resp:  Normal effort, lungs CTAB MSK:   Moves extremities without difficulty  Other:    Medical Decision Making  Medically screening exam initiated at 3:20 PM.  Appropriate orders placed.  Patricia Hodges was informed that the remainder of the evaluation will be completed by another provider, this initial triage assessment does not replace that evaluation, and the importance of remaining in the ED until their evaluation is complete.  Labs and imaging ordered   Merryl Abraham 07/01/23 1610

## 2023-07-01 NOTE — ED Provider Notes (Signed)
 [place] EMERGENCY DEPARTMENT AT [facility] Provider Note   CSN: [id number] Arrival date & time: 07/01/23  1433     History  Chief Complaint  Patient presents with   Cough    Patricia Hodges is a 78 y.o. female.  Patient is an 78 year old female coming in for a 1 week history of a cough.  She admits to productive cough, sore throat, myalgias, headache.  Denies recent antibiotic use.  Been using Tessalon Perles with no improvement of symptoms.  No known fevers.  The history is provided by the patient. No language interpreter was used.  Cough Associated symptoms: headaches and sore throat   Associated symptoms: no chest pain, no chills, no ear pain, no fever, no rash and no shortness of breath        Home Medications Prior to Admission medications   Medication Sig Start Date End Date Taking? Authorizing Provider  guaiFENesin-codeine 100-10 MG/5ML syrup Take 5 mLs by mouth every 12 (twelve) hours as needed for cough. 07/01/23  Yes Owen Blowers P, DO  apixaban (ELIQUIS) 5 MG TABS tablet Take 1 tablet (5 mg total) by mouth 2 (two) times daily. Patient not taking: Reported on 05/31/2023 05/27/23   Gerard Knight, MD  diltiazem (CARDIZEM CD) 120 MG 24 hr capsule Take 1 capsule by mouth once daily 12/10/22   McDowell, Samuel G, MD  ezetimibe (ZETIA) 10 MG tablet Take 10 mg by mouth daily. 05/15/19   [provider]  FLUoxetine (PROZAC) 10 MG tablet Take 10 mg by mouth daily. 08/08/22   [provider]  levothyroxine (SYNTHROID) 50 MCG tablet Take 50 mcg by mouth daily before breakfast. 08/04/21   [provider]  lisinopril (ZESTRIL) 5 MG tablet Take 1 tablet (5 mg total) by mouth daily. 06/11/22   Gerard Knight, MD  meclizine (ANTIVERT) 12.5 MG tablet Take 1 tablet (12.5 mg total) by mouth 3 (three) times daily as needed for dizziness. Do not take with alcohol or while driving or operating heavy machinery.  May cause drowsiness. 12/17/22    Wilhemena Harbour, NP  omeprazole (PRILOSEC OTC) 20 MG tablet Take 20 mg by mouth daily as needed (acid reflux).    [provider]      Allergies    Azithromycin, Nsaids, Raloxifene, Alendronate sodium, Augmentin [amoxicillin-pot clavulanate], Ezetimibe-simvastatin, and Statins    Review of Systems   Review of Systems  Constitutional:  Negative for chills and fever.  HENT:  Positive for sore throat. Negative for ear pain.   Eyes:  Negative for pain and visual disturbance.  Respiratory:  Positive for cough. Negative for shortness of breath.   Cardiovascular:  Negative for chest pain and palpitations.  Gastrointestinal:  Negative for abdominal pain and vomiting.  Genitourinary:  Negative for dysuria and hematuria.  Musculoskeletal:  Negative for arthralgias and back pain.  Skin:  Negative for color change and rash.  Neurological:  Positive for headaches. Negative for seizures and syncope.  All other systems reviewed and are negative.   Physical Exam Updated Vital Signs BP (!) 146/65   Pulse 65   Temp 98 F (36.7 C) (Oral)   Resp 17   Ht 5\' 4"  (1.626 m)   Wt 79.4 kg   SpO2 95%   BMI 30.04 kg/m  Physical Exam Vitals and nursing note reviewed.  Constitutional:      General: She is not in acute distress.    Appearance: She is well-developed.  HENT:  Head: Normocephalic and atraumatic.     Mouth/Throat:     Lips: Pink.     Mouth: Mucous membranes are moist.     Pharynx: Oropharynx is clear.     Comments: Cobblestoning of the oropharynx. Eyes:     Conjunctiva/sclera: Conjunctivae normal.  Cardiovascular:     Rate and Rhythm: Normal rate and regular rhythm.     Heart sounds: No murmur heard. Pulmonary:     Effort: Pulmonary effort is normal. No respiratory distress.     Breath sounds: Normal breath sounds.  Abdominal:     Palpations: Abdomen is soft.     Tenderness: There is no abdominal tenderness.  Musculoskeletal:        General: No swelling.      Cervical back: Neck supple.  Skin:    General: Skin is warm and dry.     Capillary Refill: Capillary refill takes less than 2 seconds.  Neurological:     Mental Status: She is alert.  Psychiatric:        Mood and Affect: Mood normal.     ED Results / Procedures / Treatments   Labs (all labs ordered are listed, but only abnormal results are displayed) Labs Reviewed  RESP PANEL BY RT-PCR (RSV, FLU A&B, COVID)  [id number]    EKG None  Radiology DG Chest 2 View Result Date: 07/01/2023 CLINICAL DATA:  Coughing for 1 week. EXAM: CHEST - 2 VIEW COMPARISON:  09/22/2021 and older studies. FINDINGS: Cardiac silhouette is normal in size and configuration. No mediastinal or hilar masses. No evidence of adenopathy. Lungs are clear.  No pleural effusion or pneumothorax. Skeletal structures are demineralized. Mild wedge-shaped compression deformity of what appears to be L1, unchanged. No acute skeletal abnormality. IMPRESSION: No active cardiopulmonary disease. Electronically Signed   By: Amie Portland M.D.   On: 07/01/2023 15:12    Procedures Procedures    Medications Ordered in ED Medications  doxycycline (VIBRA-TABS) tablet 100 mg (has no administration in time range)  guaiFENesin-dextromethorphan (ROBITUSSIN DM) 100-10 MG/5ML syrup 15 mL (has no administration in time range)  acetaminophen (TYLENOL) tablet 650 mg (has no administration in time range)    ED Course/ Medical Decision Making/ A&P                                 Medical Decision Making Risk OTC drugs. Prescription drug management.   78 year old female coming in for a 1 week history of a cough.  Patient is alert and oriented x 3, no acute distress, afebrile, stable vital signs.  Physical exam demonstrates cobblestoning of the oropharynx, nasal congestion, rhinorrhea, otherwise stable exam.  Breath signs are equal bilaterally with no adventitious lung sounds.  Chest x-ray demonstrates no acute process.  No focal pneumonias.   Symptoms sound like a viral in nature.  No meningeal signs.  No signs or symptoms of sepsis.  [place] distress or respiratory failure.  Patient given symptomatic medication for headache and cough.  Recommended for follow-up with PCP in the next 7 to 10 days if symptoms not improved.  Patient in no distress and overall condition improved here in the ED. Detailed discussions were had with the patient regarding current findings, and need for close f/u with PCP or on call doctor. The patient has been instructed to return immediately if the symptoms worsen in any way for re-evaluation. Patient verbalized understanding and is in agreement with current care plan. All  questions answered prior to discharge.         Final Clinical Impression(s) / ED Diagnoses Final diagnoses:  Viral URI with cough    Rx / DC Orders ED Discharge Orders          Ordered    guaiFENesin-codeine 100-10 MG/5ML syrup  Every 12 hours PRN        07/01/23 1646    doxycycline (VIBRAMYCIN) 100 MG capsule  2 times daily,   Status:  Discontinued        07/01/23 1646              Quinn Bucco, DO 07/01/23 1648

## 2023-07-01 NOTE — ED Triage Notes (Signed)
 Pt c/o cough for over a week. Pt states she's been seen 2x for same issues and was given meds that hasn't helped.

## 2023-07-01 NOTE — Discharge Instructions (Signed)
 Rest, increase oral hydration, cough syrup every 12 hours as needed for cough and congestion, Motrin or Tylenol for headaches or bodyaches.  Will call you for any positive COVID, RSV, or influenza results.  If you do not receive a call in the next 2 hours your results were negative.

## 2023-07-09 DIAGNOSIS — E785 Hyperlipidemia, unspecified: Secondary | ICD-10-CM | POA: Diagnosis not present

## 2023-07-09 DIAGNOSIS — M1990 Unspecified osteoarthritis, unspecified site: Secondary | ICD-10-CM | POA: Diagnosis not present

## 2023-07-09 DIAGNOSIS — F3342 Major depressive disorder, recurrent, in full remission: Secondary | ICD-10-CM | POA: Diagnosis not present

## 2023-07-09 DIAGNOSIS — E669 Obesity, unspecified: Secondary | ICD-10-CM | POA: Diagnosis not present

## 2023-07-09 DIAGNOSIS — D6869 Other thrombophilia: Secondary | ICD-10-CM | POA: Diagnosis not present

## 2023-07-09 DIAGNOSIS — M48061 Spinal stenosis, lumbar region without neurogenic claudication: Secondary | ICD-10-CM | POA: Diagnosis not present

## 2023-07-09 DIAGNOSIS — E039 Hypothyroidism, unspecified: Secondary | ICD-10-CM | POA: Diagnosis not present

## 2023-07-09 DIAGNOSIS — K2100 Gastro-esophageal reflux disease with esophagitis, without bleeding: Secondary | ICD-10-CM | POA: Diagnosis not present

## 2023-07-09 DIAGNOSIS — I482 Chronic atrial fibrillation, unspecified: Secondary | ICD-10-CM | POA: Diagnosis not present

## 2023-07-09 DIAGNOSIS — G8929 Other chronic pain: Secondary | ICD-10-CM | POA: Diagnosis not present

## 2023-07-09 DIAGNOSIS — N1831 Chronic kidney disease, stage 3a: Secondary | ICD-10-CM | POA: Diagnosis not present

## 2023-07-09 DIAGNOSIS — I129 Hypertensive chronic kidney disease with stage 1 through stage 4 chronic kidney disease, or unspecified chronic kidney disease: Secondary | ICD-10-CM | POA: Diagnosis not present

## 2023-07-25 ENCOUNTER — Ambulatory Visit: Attending: Cardiology | Admitting: Cardiology

## 2023-07-25 ENCOUNTER — Encounter: Payer: Self-pay | Admitting: Cardiology

## 2023-07-25 VITALS — BP 126/66 | HR 66 | Ht 64.0 in | Wt 176.0 lb

## 2023-07-25 DIAGNOSIS — S80812A Abrasion, left lower leg, initial encounter: Secondary | ICD-10-CM | POA: Diagnosis not present

## 2023-07-25 DIAGNOSIS — I10 Essential (primary) hypertension: Secondary | ICD-10-CM | POA: Diagnosis not present

## 2023-07-25 DIAGNOSIS — Z6829 Body mass index (BMI) 29.0-29.9, adult: Secondary | ICD-10-CM | POA: Diagnosis not present

## 2023-07-25 DIAGNOSIS — I700 Atherosclerosis of aorta: Secondary | ICD-10-CM | POA: Diagnosis not present

## 2023-07-25 DIAGNOSIS — I480 Paroxysmal atrial fibrillation: Secondary | ICD-10-CM

## 2023-07-25 DIAGNOSIS — E663 Overweight: Secondary | ICD-10-CM | POA: Diagnosis not present

## 2023-07-25 NOTE — Progress Notes (Signed)
    Cardiology Office Note  Date: 07/25/2023   ID: Patricia Hodges, DOB 11-30-1945, MRN [id number]  History of Present Illness: Patricia Hodges is an 78 y.o. female last seen in September 2024 by Ms. Dunn PA-C, I reviewed her note.  She is here for a routine visit.  She reports no interval palpitations, no worsening shortness of breath with typical activities or exertional chest pain.  We went over her medications.  She remains on Eliquis , reports no spontaneous bleeding problems.  I reviewed her most recent lab work.  She has a follow-up visit at [facility] for physical this year.  Physical Exam: VS:  BP 126/66   Pulse 66   Ht 5\' 4"  (1.626 m)   Wt 176 lb (79.8 kg)   SpO2 96%   BMI 30.21 kg/m , BMI Body mass index is 30.21 kg/m.  Wt Readings from Last 3 Encounters:  07/25/23 176 lb (79.8 kg)  07/01/23 175 lb (79.4 kg)  12/04/22 175 lb (79.4 kg)    General: Patient appears comfortable at rest. HEENT: Conjunctiva and lids normal. Neck: Supple, no elevated JVP or carotid bruits. Lungs: Clear to auscultation, nonlabored breathing at rest. Cardiac: Regular rate and rhythm, no S3 or significant systolic murmur.  ECG:  An ECG dated 12/17/2022 was personally reviewed today and demonstrated:  Sinus bradycardia.  Labwork: May 2024: Cholesterol 231, triglycerides 148, HDL 43, LDL 161 12/04/2022: BUN 17; Creatinine, Ser 0.94; Hemoglobin 14.2; Platelets 261; Potassium 4.1; Sodium 139   Other Studies Reviewed Today:  No interval cardiac testing for review today.  Assessment and Plan:  1.  Paroxysmal atrial fibrillation with CHA2DS2-VASc score of 4.  She remains symptomatically stable without recurring palpitations on medical therapy.  Heart rate is regular today.  Continue Cardizem  CD 120 mg daily and Eliquis  5 mg twice daily.  I reviewed her most recent lab work.  2.  Primary hypertension.  Blood pressure control is reasonable today, continue lisinopril  5 mg daily.  3.  Aortic  atherosclerosis.  She has a history of statin myalgias.  Currently on Zetia  10 mg daily per PCP.  Disposition:  Follow up 6 months.  Signed, Gerard Knight, M.D., F.A.C.C. [facility] at [facility]

## 2023-07-25 NOTE — Patient Instructions (Signed)
 Medication Instructions:  Your physician recommends that you continue on your current medications as directed. Please refer to the Current Medication list given to you today.  *If you need a refill on your cardiac medications before your next appointment, please call your pharmacy*  Lab Work: None If you have labs (blood work) drawn today and your tests are completely normal, you will receive your results only by: MyChart Message (if you have MyChart) OR A paper copy in the mail If you have any lab test that is abnormal or we need to change your treatment, we will call you to review the results.  Testing/Procedures: None  Follow-Up: At [facility], you and your health needs are our priority.  As part of our continuing mission to provide you with exceptional heart care, our providers are all part of one team.  This team includes your primary Cardiologist (physician) and Advanced Practice Providers or APPs (Physician Assistants and Nurse Practitioners) who all work together to provide you with the care you need, when you need it.  Your next appointment:   6 month(s)  Provider:   You may see Teddie Favre, MD or one of the following Advanced Practice Providers on your designated Care Team:   Woodfin Hays, PA-C  [place], [place] Theotis Flake, [place]     We recommend signing up for the patient portal called "MyChart".  Sign up information is provided on this After Visit Summary.  MyChart is used to connect with patients for Virtual Visits (Telemedicine).  Patients are able to view lab/test results, encounter notes, upcoming appointments, etc.  Non-urgent messages can be sent to your provider as well.   To learn more about what you can do with MyChart, go to [email or web address].   Other Instructions

## 2023-09-12 DIAGNOSIS — E785 Hyperlipidemia, unspecified: Secondary | ICD-10-CM | POA: Diagnosis not present

## 2023-09-12 DIAGNOSIS — Z0001 Encounter for general adult medical examination with abnormal findings: Secondary | ICD-10-CM | POA: Diagnosis not present

## 2023-09-12 DIAGNOSIS — I4891 Unspecified atrial fibrillation: Secondary | ICD-10-CM | POA: Diagnosis not present

## 2023-09-12 DIAGNOSIS — Z1331 Encounter for screening for depression: Secondary | ICD-10-CM | POA: Diagnosis not present

## 2023-09-12 DIAGNOSIS — N390 Urinary tract infection, site not specified: Secondary | ICD-10-CM | POA: Diagnosis not present

## 2023-09-12 DIAGNOSIS — Z6829 Body mass index (BMI) 29.0-29.9, adult: Secondary | ICD-10-CM | POA: Diagnosis not present

## 2023-09-12 DIAGNOSIS — E663 Overweight: Secondary | ICD-10-CM | POA: Diagnosis not present

## 2023-09-12 DIAGNOSIS — E039 Hypothyroidism, unspecified: Secondary | ICD-10-CM | POA: Diagnosis not present

## 2023-09-13 DIAGNOSIS — E785 Hyperlipidemia, unspecified: Secondary | ICD-10-CM | POA: Diagnosis not present

## 2023-09-13 DIAGNOSIS — Z0001 Encounter for general adult medical examination with abnormal findings: Secondary | ICD-10-CM | POA: Diagnosis not present

## 2023-09-13 DIAGNOSIS — N390 Urinary tract infection, site not specified: Secondary | ICD-10-CM | POA: Diagnosis not present

## 2023-09-21 ENCOUNTER — Other Ambulatory Visit: Payer: Self-pay | Admitting: Cardiology

## 2023-11-21 ENCOUNTER — Ambulatory Visit: Admitting: Family Medicine

## 2023-12-03 ENCOUNTER — Encounter: Payer: Self-pay | Admitting: Family Medicine

## 2023-12-03 ENCOUNTER — Ambulatory Visit (INDEPENDENT_AMBULATORY_CARE_PROVIDER_SITE_OTHER): Admitting: Family Medicine

## 2023-12-03 VITALS — BP 145/79 | HR 65 | Ht 64.0 in | Wt 179.0 lb

## 2023-12-03 DIAGNOSIS — Z23 Encounter for immunization: Secondary | ICD-10-CM | POA: Diagnosis not present

## 2023-12-03 DIAGNOSIS — R519 Headache, unspecified: Secondary | ICD-10-CM

## 2023-12-03 DIAGNOSIS — E039 Hypothyroidism, unspecified: Secondary | ICD-10-CM | POA: Diagnosis not present

## 2023-12-03 DIAGNOSIS — M47812 Spondylosis without myelopathy or radiculopathy, cervical region: Secondary | ICD-10-CM | POA: Insufficient documentation

## 2023-12-03 DIAGNOSIS — I4891 Unspecified atrial fibrillation: Secondary | ICD-10-CM

## 2023-12-03 DIAGNOSIS — E785 Hyperlipidemia, unspecified: Secondary | ICD-10-CM

## 2023-12-03 DIAGNOSIS — Z8619 Personal history of other infectious and parasitic diseases: Secondary | ICD-10-CM | POA: Insufficient documentation

## 2023-12-03 DIAGNOSIS — I10 Essential (primary) hypertension: Secondary | ICD-10-CM | POA: Diagnosis not present

## 2023-12-03 MED ORDER — LEVOTHYROXINE SODIUM 50 MCG PO TABS: 50.0000 ug | ORAL_TABLET | Freq: Every day | ORAL | 3 refills | Status: AC

## 2023-12-03 MED ORDER — EZETIMIBE 10 MG PO TABS: 10.0000 mg | ORAL_TABLET | Freq: Every day | ORAL | 3 refills | Status: AC

## 2023-12-03 NOTE — Assessment & Plan Note (Signed)
 Fair control given advanced age.  Continue lisinopril .  Labs today.

## 2023-12-03 NOTE — Assessment & Plan Note (Signed)
 Concern for occipital neuralgia.  Referring to neurology.

## 2023-12-03 NOTE — Patient Instructions (Addendum)
Labs today.  Referral placed.  Follow up in 3 months.

## 2023-12-03 NOTE — Assessment & Plan Note (Signed)
 Unsure of control.  Lipid panel today.  Continue Zetia .  Zetia  refilled.

## 2023-12-03 NOTE — Assessment & Plan Note (Signed)
 Stable.  In sinus rhythm today.  Continue Eliquis  and diltiazem .

## 2023-12-03 NOTE — Assessment & Plan Note (Signed)
 Levothyroxine  refilled.  TSH today.

## 2023-12-03 NOTE — Progress Notes (Signed)
 Subjective:  Patient ID: Patricia Hodges, female    DOB: 02/20/1946  Age: 78 y.o. MRN: [id number]  CC:   Chief Complaint  Patient presents with   Establish Care   Pain    Head, present all the time, imaging done, seen by Dr Addie    HPI:  78 year old female with paroxysmal A-fib, hypertension, GERD, hypothyroidism, cervical spondylosis, hyperlipidemia presents to establish care.  Patient states that she has been experiencing pain in the left occipital region for the past 6 months.  She has seen PM&R and has received injections in the neck.  She states that this has not improved her issue.  She states that the pain is sharp.  Radiates to the left ear.  Patient's blood pressure mildly elevated here today.  She is on lisinopril .  Needs labs today.  Per her report, hypothyroidism has been stable.  Needs refill on levothyroxine .  Labs today.  Unsure of control of lipids.  She is statin intolerant.  She is on Zetia .  Needs refill on Zetia .  Follows with cardiology regarding atrial fibrillation.  She is on Eliquis  and diltiazem .  Patient desires flu vaccine today.  Patient Active Problem List   Diagnosis Date Noted   Atrial fibrillation (HCC) 12/03/2023   History of Helicobacter pylori infection 12/03/2023   Essential hypertension 12/03/2023   Hypothyroidism 12/03/2023   Cervical spondylosis 12/03/2023   Occipital pain 12/03/2023   Hyperlipidemia 05/26/2021   History of colonic polyps    GERD 11/17/2007    Social Hx   Social History   Socioeconomic History   Marital status: Married    Spouse name: Not on file   Number of children: Not on file   Years of education: Not on file   Highest education level: Not on file  Occupational History   Not on file  Tobacco Use   Smoking status: Former    Current packs/day: 0.00    Average packs/day: 1 pack/day for 20.0 years (20.0 ttl pk-yrs)    Types: Cigarettes    Start date: 11/23/1958    Quit date: 11/23/1978    Years since  quitting: 45.0   Smokeless tobacco: Never   Tobacco comments:    quit about 30 yrs ago  Vaping Use   Vaping status: Never Used  Substance and Sexual Activity   Alcohol use: No   Drug use: No   Sexual activity: Not on file  Other Topics Concern   Not on file  Social History Narrative   Not on file   Social Drivers of Health   Financial Resource Strain: Not on file  Food Insecurity: Not on file  Transportation Needs: Not on file  Physical Activity: Not on file  Stress: Not on file  Social Connections: Not on file    Review of Systems Per HPI  Objective:  BP (!) 145/79   Pulse 65   Ht 5' 4 (1.626 m)   Wt 179 lb (81.2 kg)   BMI 30.73 kg/m      12/03/2023    9:03 AM 12/03/2023    8:11 AM 12/03/2023    8:07 AM  BP/Weight  Systolic BP 145 147 155  Diastolic BP 79 78 80  Wt. (Lbs)   179  BMI   30.73 kg/m2    Physical Exam Vitals and nursing note reviewed.  Constitutional:      General: She is not in acute distress.    Appearance: Normal appearance.  HENT:     Head:  Normocephalic and atraumatic.  Eyes:     General:        Right eye: No discharge.        Left eye: No discharge.     Conjunctiva/sclera: Conjunctivae normal.  Cardiovascular:     Rate and Rhythm: Normal rate and regular rhythm.  Pulmonary:     Effort: Pulmonary effort is normal.     Breath sounds: Normal breath sounds. No wheezing, rhonchi or rales.  Neurological:     Mental Status: She is alert.  Psychiatric:        Mood and Affect: Mood normal.        Behavior: Behavior normal.     Lab Results  Component Value Date   WBC 7.3 12/04/2022   HGB 14.2 12/04/2022   HCT 44.0 12/04/2022   PLT 261 12/04/2022   GLUCOSE 96 12/04/2022   ALT 39 09/22/2021   AST 40 09/22/2021   NA 139 12/04/2022   K 4.1 12/04/2022   CL 105 12/04/2022   CREATININE 0.94 12/04/2022   BUN 17 12/04/2022   CO2 25 12/04/2022   TSH 2.531 09/22/2021   INR 2.8 06/05/2023     Assessment & Plan:  Essential  hypertension Assessment & Plan: Fair control given advanced age.  Continue lisinopril .  Labs today.  Orders: -     CMP14+EGFR -     Microalbumin / creatinine urine ratio  Atrial fibrillation, unspecified type (HCC) Assessment & Plan: Stable.  In sinus rhythm today.  Continue Eliquis  and diltiazem .  Orders: -     CBC  Hypothyroidism, unspecified type Assessment & Plan: Levothyroxine  refilled.  TSH today.  Orders: -     Levothyroxine  Sodium; Take 1 tablet (50 mcg total) by mouth daily before breakfast.  Dispense: 90 tablet; Refill: 3 -     TSH  Occipital pain Assessment & Plan: Concern for occipital neuralgia.  Referring to neurology.  Orders: -     Ambulatory referral to Neurology  Hyperlipidemia, unspecified hyperlipidemia type Assessment & Plan: Unsure of control.  Lipid panel today.  Continue Zetia .  Zetia  refilled.  Orders: -     Ezetimibe ; Take 1 tablet (10 mg total) by mouth daily.  Dispense: 90 tablet; Refill: 3 -     Lipid panel  Need for immunization against influenza -     Flu vaccine HIGH DOSE PF(Fluzone Trivalent)    Follow-up:  3 months  Brainard Highfill Bluford DO [facility]

## 2023-12-04 ENCOUNTER — Ambulatory Visit: Payer: Self-pay | Admitting: Family Medicine

## 2023-12-04 LAB — CBC
Hematocrit: 46.2 % (ref 34.0–46.6)
Hemoglobin: 15 g/dL (ref 11.1–15.9)
MCH: 30.2 pg (ref 26.6–33.0)
MCHC: 32.5 g/dL (ref 31.5–35.7)
MCV: 93 fL (ref 79–97)
Platelets: 297 x10E3/uL (ref 150–450)
RBC: 4.97 x10E6/uL (ref 3.77–5.28)
RDW: 12.6 % (ref 11.7–15.4)
WBC: 8.5 x10E3/uL (ref 3.4–10.8)

## 2023-12-04 LAB — CMP14+EGFR
ALT: 22 IU/L (ref 0–32)
AST: 25 IU/L (ref 0–40)
Albumin: 4.4 g/dL (ref 3.8–4.8)
Alkaline Phosphatase: 118 IU/L (ref 49–135)
BUN/Creatinine Ratio: 15 (ref 12–28)
BUN: 15 mg/dL (ref 8–27)
Bilirubin Total: 0.3 mg/dL (ref 0.0–1.2)
CO2: 22 mmol/L (ref 20–29)
Calcium: 9.6 mg/dL (ref 8.7–10.3)
Chloride: 103 mmol/L (ref 96–106)
Creatinine, Ser: 0.97 mg/dL (ref 0.57–1.00)
Globulin, Total: 2.8 g/dL (ref 1.5–4.5)
Glucose: 98 mg/dL (ref 70–99)
Potassium: 4.6 mmol/L (ref 3.5–5.2)
Sodium: 139 mmol/L (ref 134–144)
Total Protein: 7.2 g/dL (ref 6.0–8.5)
eGFR: 59 mL/min/1.73 — ABNORMAL LOW (ref >59)

## 2023-12-04 LAB — LIPID PANEL
Chol/HDL Ratio: 5.7 ratio — ABNORMAL HIGH (ref 0.0–4.4)
Cholesterol, Total: 228 mg/dL — ABNORMAL HIGH (ref 100–199)
HDL: 40 mg/dL (ref >39)
LDL Chol Calc (NIH): 159 mg/dL — ABNORMAL HIGH (ref 0–99)
Triglycerides: 157 mg/dL — ABNORMAL HIGH (ref 0–149)
VLDL Cholesterol Cal: 29 mg/dL (ref 5–40)

## 2023-12-04 LAB — TSH: TSH: 1.99 u[IU]/mL (ref 0.450–4.500)

## 2023-12-04 LAB — MICROALBUMIN / CREATININE URINE RATIO
Creatinine, Urine: 83.6 mg/dL
Microalb/Creat Ratio: 5 mg/g{creat} (ref 0–29)
Microalbumin, Urine: 4.1 ug/mL

## 2023-12-06 ENCOUNTER — Telehealth: Payer: Self-pay | Admitting: Family Medicine

## 2023-12-06 NOTE — Telephone Encounter (Signed)
 Patient is requesting prescription for Cipro 500 mg,  [facility]

## 2023-12-07 ENCOUNTER — Other Ambulatory Visit: Payer: Self-pay | Admitting: Cardiology

## 2023-12-09 NOTE — Telephone Encounter (Signed)
 She said if it returns she will schedule an appointment to be seen

## 2023-12-09 NOTE — Telephone Encounter (Signed)
 Prescription refill request for Eliquis  received. Indication:afib Last office visit:5/25 Scr:0.97  9/25 Age: 78 Weight:81.2  kg  Prescription refilled

## 2023-12-09 NOTE — Telephone Encounter (Signed)
 Patient states she has bladder infection and knows she has bladder infection and normally takes this but had some left over and took them and is doing better now

## 2023-12-10 ENCOUNTER — Encounter: Payer: Self-pay | Admitting: Neurology

## 2023-12-14 ENCOUNTER — Other Ambulatory Visit: Payer: Self-pay | Admitting: Cardiology

## 2023-12-16 ENCOUNTER — Telehealth: Payer: Self-pay

## 2023-12-16 ENCOUNTER — Ambulatory Visit (INDEPENDENT_AMBULATORY_CARE_PROVIDER_SITE_OTHER): Admitting: Family Medicine

## 2023-12-16 ENCOUNTER — Encounter: Payer: Self-pay | Admitting: Family Medicine

## 2023-12-16 VITALS — BP 133/82 | HR 61 | Ht 64.0 in | Wt 177.0 lb

## 2023-12-16 DIAGNOSIS — R300 Dysuria: Secondary | ICD-10-CM | POA: Diagnosis not present

## 2023-12-16 MED ORDER — CIPROFLOXACIN HCL 500 MG PO TABS: 500.0000 mg | ORAL_TABLET | Freq: Two times a day (BID) | ORAL | 0 refills | Status: DC

## 2023-12-16 NOTE — Progress Notes (Signed)
 Subjective:  Patient ID: Patricia Hodges, female    DOB: [birth year]  Age: 78 y.o. MRN: [id number]  CC:   Chief Complaint  Patient presents with   Urinary Tract Infection    Painful with urination , chills, very little urination     HPI:  78 year old female presents with concerns for UTI.  Patient reports a 4 to 5-day history of symptoms.  Patricia Hodges reports urinary frequency, urgency, dysuria.  Also reports chills.  No abdominal pain.  No documented fever.  No relieving factors.  No other complaints.  Patient Active Problem List   Diagnosis Date Noted   Dysuria 12/16/2023   Atrial fibrillation (HCC) 12/03/2023   History of Helicobacter pylori infection 12/03/2023   Essential hypertension 12/03/2023   Hypothyroidism 12/03/2023   Cervical spondylosis 12/03/2023   Occipital pain 12/03/2023   Hyperlipidemia 05/26/2021   History of colonic polyps    GERD 11/17/2007    Social Hx   Social History   Socioeconomic History   Marital status: Married    Spouse name: Not on file   Number of children: Not on file   Years of education: Not on file   Highest education level: Not on file  Occupational History   Not on file  Tobacco Use   Smoking status: Former    Current packs/day: 0.00    Average packs/day: 1 pack/day for 20.0 years (20.0 ttl pk-yrs)    Types: Cigarettes    Start date: 11/23/1958    Quit date: 11/23/1978    Years since quitting: 45.0   Smokeless tobacco: Never   Tobacco comments:    quit about 30 yrs ago  Vaping Use   Vaping status: Never Used  Substance and Sexual Activity   Alcohol use: No   Drug use: No   Sexual activity: Not on file  Other Topics Concern   Not on file  Social History Narrative   Not on file   Social Drivers of Health   Financial Resource Strain: Not on file  Food Insecurity: Not on file  Transportation Needs: Not on file  Physical Activity: Not on file  Stress: Not on file  Social Connections: Not on file    Review of  Systems Per HPI  Objective:  BP 133/82   Pulse 61   Ht 5' 4 (1.626 m)   Wt 177 lb (80.3 kg)   SpO2 98%   BMI 30.38 kg/m      12/16/2023    4:14 PM 12/03/2023    9:03 AM 12/03/2023    8:11 AM  BP/Weight  Systolic BP 133 145 147  Diastolic BP 82 79 78  Wt. (Lbs) 177    BMI 30.38 kg/m2      Physical Exam Vitals and nursing note reviewed.  Constitutional:      General: Patricia Hodges is not in acute distress. HENT:     Head: Normocephalic and atraumatic.  Pulmonary:     Effort: Pulmonary effort is normal. No respiratory distress.  Neurological:     Mental Status: Patricia Hodges is alert.  Psychiatric:        Mood and Affect: Mood normal.        Behavior: Behavior normal.      Assessment & Plan:  Dysuria Assessment & Plan: UA consistent with UTI.  Sending culture.  Placing on Cipro.  Orders: -     Urinalysis -     Urine Culture  Other orders -     Ciprofloxacin HCl; Take 1 tablet (  500 mg total) by mouth 2 (two) times daily.  Dispense: 14 tablet; Refill: 0    Follow-up:  Return if symptoms worsen or fail to improve.  Jacqulyn Ahle DO [facility]

## 2023-12-16 NOTE — Telephone Encounter (Signed)
 Copied from CRM #[id number]. Topic: Referral - Question >> Dec 13, 2023 11:35 AM Selinda RAMAN wrote: Reason for CRM: The patient called in stating that [facility] cannot see until the end of January and she wants to know if there is another Neurologist who can see her sooner. She states she may not even be here or around any longer by January. She is hoping to be seen way before then. Please assist patient further.

## 2023-12-16 NOTE — Assessment & Plan Note (Signed)
 UA consistent with UTI.  Sending culture.  Placing on Cipro.

## 2023-12-19 LAB — URINALYSIS
Bilirubin, UA: NEGATIVE
Glucose, UA: NEGATIVE
Ketones, UA: NEGATIVE
Nitrite, UA: NEGATIVE
Protein,UA: NEGATIVE
Specific Gravity, UA: 1.025 (ref 1.005–1.030)
Urobilinogen, Ur: 0.2 mg/dL (ref 0.2–1.0)
pH, UA: 5.5 (ref 5.0–7.5)

## 2023-12-20 LAB — URINE CULTURE

## 2023-12-24 DIAGNOSIS — Z85828 Personal history of other malignant neoplasm of skin: Secondary | ICD-10-CM | POA: Diagnosis not present

## 2023-12-24 DIAGNOSIS — L573 Poikiloderma of Civatte: Secondary | ICD-10-CM | POA: Diagnosis not present

## 2023-12-24 DIAGNOSIS — L570 Actinic keratosis: Secondary | ICD-10-CM | POA: Diagnosis not present

## 2023-12-24 DIAGNOSIS — B009 Herpesviral infection, unspecified: Secondary | ICD-10-CM | POA: Diagnosis not present

## 2024-01-20 ENCOUNTER — Encounter: Payer: Self-pay | Admitting: Radiology

## 2024-01-27 ENCOUNTER — Other Ambulatory Visit: Payer: Self-pay | Admitting: Family Medicine

## 2024-01-30 ENCOUNTER — Encounter: Payer: Self-pay | Admitting: Cardiology

## 2024-01-30 ENCOUNTER — Ambulatory Visit: Attending: Cardiology | Admitting: Cardiology

## 2024-01-30 VITALS — BP 126/70 | HR 68 | Ht 64.0 in | Wt 180.6 lb

## 2024-01-30 DIAGNOSIS — I10 Essential (primary) hypertension: Secondary | ICD-10-CM

## 2024-01-30 DIAGNOSIS — I480 Paroxysmal atrial fibrillation: Secondary | ICD-10-CM | POA: Diagnosis not present

## 2024-01-30 NOTE — Progress Notes (Signed)
    Cardiology Office Note  Date: 01/30/2024   ID: Patricia Hodges, DOB 02/14/1946, MRN [id number]  History of Present Illness: Patricia Hodges is an 78 y.o. female last seen in May.  She is here for a routine visit.  Reports no increasing sense of palpitations, no exertional chest pain or syncope.  I went over her medications.  She does not report any spontaneous bleeding problems on Eliquis .  Hemoglobin normal in September.  Blood pressure was well-controlled today.  I reviewed her ECG today which shows normal sinus rhythm with borderline low voltage.  Physical Exam: VS:  BP 126/70   Pulse 68   Ht 5' 4 (1.626 m)   Wt 180 lb 9.6 oz (81.9 kg)   SpO2 97%   BMI 31.00 kg/m , BMI Body mass index is 31 kg/m.  Wt Readings from Last 3 Encounters:  01/30/24 180 lb 9.6 oz (81.9 kg)  12/16/23 177 lb (80.3 kg)  12/03/23 179 lb (81.2 kg)    General: Patient appears comfortable at rest. HEENT: Conjunctiva and lids normal. Neck: Supple, no elevated JVP or carotid bruits. Lungs: Clear to auscultation, nonlabored breathing at rest. Cardiac: Regular rate and rhythm, no S3 or significant systolic murmur. Extremities: No pitting edema.  ECG:  An ECG dated 12/17/2022 was personally reviewed today and demonstrated:  Sinus bradycardia.  Labwork: 12/03/2023: ALT 22; AST 25; BUN 15; Creatinine, Ser 0.97; Hemoglobin 15.0; Platelets 297; Potassium 4.6; Sodium 139; TSH 1.990     Component Value Date/Time   CHOL 228 (H) 12/03/2023 0933   TRIG 157 (H) 12/03/2023 0933   HDL 40 12/03/2023 0933   CHOLHDL 5.7 (H) 12/03/2023 0933   LDLCALC 159 (H) 12/03/2023 0933   Other Studies Reviewed Today:  No interval cardiac testing for review today.  Assessment and Plan:  1.  Paroxysmal atrial fibrillation with CHA2DS2-VASc score of 4.  Symptomatically stable with no increasing palpitations, she is in sinus rhythm today by ECG.  Continue Cardizem  CD 120 mg daily and Eliquis  5 mg twice daily.   Interval lab work reviewed.   2.  Primary hypertension.  Blood pressure well-controlled today.  Continue lisinopril  5 mg daily.   3.  Aortic atherosclerosis.  She has a history of statin myalgias.  Currently on Zetia  10 mg daily per PCP.  Disposition:  Follow up 6 months.  Signed, Jayson JUDITHANN Sierras, M.D., F.A.C.C. [facility] at [facility]

## 2024-01-30 NOTE — Patient Instructions (Signed)
 Medication Instructions:  Your physician recommends that you continue on your current medications as directed. Please refer to the Current Medication list given to you today.   Labwork: None today  Testing/Procedures: None today  Follow-Up: 6 months  Any Other Special Instructions Will Be Listed Below (If Applicable).  If you need a refill on your cardiac medications before your next appointment, please call your pharmacy.

## 2024-02-17 ENCOUNTER — Telehealth: Payer: Self-pay | Admitting: Cardiology

## 2024-02-17 NOTE — Telephone Encounter (Signed)
 Pt c/o medication issue:  1. Name of Medication: apixaban  (ELIQUIS ) 5 MG TABS tablet [[id number]]   2. How are you currently taking this medication (dosage and times per day)? Na   3. Are you having a reaction (difficulty breathing--STAT)? Na   4. What is your medication issue? Pt stated her last visit they talked about getting patient assistance for this med and would like to know the status?    Bet [phone]

## 2024-02-18 ENCOUNTER — Other Ambulatory Visit (HOSPITAL_COMMUNITY): Payer: Self-pay

## 2024-02-18 ENCOUNTER — Telehealth: Payer: Self-pay | Admitting: Pharmacy Technician

## 2024-02-18 NOTE — Telephone Encounter (Signed)
  She doesn't have cardiomyopathy so unable to get a grant.  Per test claims: eliquis  is 47.00 for 30 days, xarelto the same. Pradaxa brand and generic not on formulary. We could send for bms application but then come 03/19/24 she would of had to pay 3% of income to get re-approved. So she would have to pay the 47 until that is met.   I tried to call her but went to vm and vm is full. I will try her back later

## 2024-02-18 NOTE — Telephone Encounter (Signed)
   She doesn't have cardiomyopathy so unable to get a grant.  Per test claims: eliquis  is 47.00 for 30 days, xarelto the same. Pradaxa brand and generic not on formulary. We could send for bms application but then come 03/19/24 she would of had to pay 3% of income to get re-approved. So she would have to pay the 47 until that is met.   Tried to call the patient and went to vm but vm full.

## 2024-02-19 NOTE — Telephone Encounter (Signed)
 Tried to call the patient again and went to vm and vm full

## 2024-02-20 NOTE — Telephone Encounter (Signed)
 Tried to call patient and vm box full

## 2024-02-21 NOTE — Telephone Encounter (Signed)
 Tried to call patient and vm box full

## 2024-02-25 NOTE — Telephone Encounter (Signed)
 Tried to call patient and went to vm and vm box full

## 2024-02-26 NOTE — Progress Notes (Addendum)
 NEUROLOGY CONSULTATION NOTE  Patricia MCGAHEE MRN: [id number] DOB: [birth year]  Referring provider: Jayce Cook, DO Primary care provider: Jayce Cook, DO  Reason for consult:  occipital neuralgia  Assessment/Plan:   Left sided occipital neuralgia  Discussed treatment options such as physical therapy, pharmacologic management (gabapentin) and occipital nerve blocks.  At this time, she says physical therapy would be cost-inhibited.  She is concerned about side effects of medication.  Therefore, will refer to physical medicine and rehab for consideration of occipital nerve block.  She will return in one month after injection.  If no improvement, she is open to reconsider other options.   Subjective:    Discussed the use of AI scribe software for clinical note transcription with the patient, who gave verbal consent to proceed.  History of Present Illness Patricia Hodges is an 78 year old right-handed female with paroxysmal atrial fibrillation on Eliquis , HTN and history of BCC and SCC who presents for occipital neuralgia.  History supplemented by her accompanying husband and referring provider's note.  MRI of cervical spine personally reviewed.  She has been experiencing headaches for at least a year, localized to the left occipital region and radiating to her ear. The pain is described as a 'knife stabbing' sensation, exacerbated by movements such as turning her head, coughing, or sneezing. The headaches occur daily and can be severe enough to disrupt activities, such as sitting in church.  No burning or tingling over the skin in the affected area. No radiation of pain into the arm. The right side of the neck is unaffected.  She manages the pain with Tylenol  and oxycodone , noting that oxycodone  provides significant relief. She has chronic neck pain with history of prior cervical fusion.  MRI of the cervical spine without contrast on 05/10/2023 demonstrated multilevel cervical  spondylosis, progressed since 2017 as well as moderate canal stenosis with moderate bilateral foraminal stenosis at C4-5, mild canal stenosis with moderate left foraminal stenosis at C3-4, mild to moderate bilateral foraminal stenosis at C6-7 and C5-6 ACDF with solid arthrodesis.  She underwent left C7-T1 epidural injection which provided temporary relief for neck pain but did not significantly impact her headaches. She has not participated in physical therapy for her neck since the onset of her headaches.  She has not tried gabapentin, pregabalin, or any other medications specifically for nerve pain.      PAST MEDICAL HISTORY: Past Medical History:  Diagnosis Date   Atrial fibrillation (HCC)    a. diagnosed in 05/2021 and underwent successful DCCV in 08/2021   Basal cell carcinoma 04/25/2017   nod- left lower back (CX35FU)   Diverticulosis    Dysphagia    Esophageal reflux    Gastroenteritis, eosinophilic    H pylori ulcer    Hyperlipidemia    Osteoarthritis    S/P colonoscopy 2006   diverticulosis, due for repeat in 2016   S/P endoscopy 2007   Schatzki's Ring   SCC (squamous cell carcinoma) 07/22/2019   Left anterior neck- (cx81fu)   Squamous cell carcinoma of skin 02/06/2011   in situ (CX35FU)    PAST SURGICAL HISTORY: Past Surgical History:  Procedure Laterality Date   ANTERIOR CERVICAL DECOMP/DISCECTOMY FUSION N/A 01/30/2016   Procedure: C5-6 Anterior Cervical Discectomy and Fusion, Allograft, Plate;  Surgeon: Oneil JAYSON Herald, MD;  Location: [facility];  Service: Orthopedics;  Laterality: N/A;   bladder tack     BUNIONECTOMY Right    CARDIOVERSION N/A 08/17/2021   Procedure: CARDIOVERSION;  Surgeon:  Alvan Dorn FALCON, MD;  Location: [facility];  Service: Endoscopy;  Laterality: N/A;   CARPAL TUNNEL RELEASE Left    CATARACT EXTRACTION W/PHACO Right 11/28/2015   Procedure: CATARACT EXTRACTION PHACO AND INTRAOCULAR LENS PLACEMENT (IOC) RIGHT;  Surgeon: Cherene Mania, MD;  Location: [facility];   Service: Ophthalmology;  Laterality: Right;  CDE: 6.96   CATARACT EXTRACTION W/PHACO Left 12/15/2015   Procedure: CATARACT EXTRACTION PHACO AND INTRAOCULAR LENS PLACEMENT LEFT EYE CDE=9.04;  Surgeon: Cherene Mania, MD;  Location: [facility];  Service: Ophthalmology;  Laterality: Left;  left   CESAREAN SECTION     CHOLECYSTECTOMY     COLONOSCOPY  2006   COLONOSCOPY N/A 09/08/2014   Procedure: COLONOSCOPY;  Surgeon: Lamar CHRISTELLA Hollingshead, MD;  Location: [facility];  Service: Endoscopy;  Laterality: N/A;  10:30 AM   COLONOSCOPY N/A 11/20/2017   Procedure: COLONOSCOPY;  Surgeon: Hollingshead Lamar CHRISTELLA, MD;  Location: [facility];  Service: Endoscopy;  Laterality: N/A;  10:30   feet surgery     HAND SURGERY Right    on ring finger due to swollen knuckle   Hx schatski's ring     POLYPECTOMY  11/20/2017   Procedure: POLYPECTOMY;  Surgeon: Hollingshead Lamar CHRISTELLA, MD;  Location: [facility];  Service: Endoscopy;;  ascending colon (CSx1) descending colon(CSx1)   S/P Hysterectomy      MEDICATIONS: Current Outpatient Medications on File Prior to Visit  Medication Sig Dispense Refill   apixaban  (ELIQUIS ) 5 MG TABS tablet Take 1 tablet by mouth twice daily 180 tablet 1   ciprofloxacin  (CIPRO ) 500 MG tablet Take 1 tablet by mouth twice daily 14 tablet 0   diltiazem  (CARDIZEM  CD) 120 MG 24 hr capsule Take 1 capsule by mouth once daily 90 capsule 2   ezetimibe  (ZETIA ) 10 MG tablet Take 1 tablet (10 mg total) by mouth daily. 90 tablet 3   FLUoxetine  (PROZAC ) 10 MG tablet Take 10 mg by mouth daily.     levothyroxine  (SYNTHROID ) 50 MCG tablet Take 1 tablet (50 mcg total) by mouth daily before breakfast. 90 tablet 3   lisinopril  (ZESTRIL ) 5 MG tablet Take 1 tablet by mouth once daily 90 tablet 2   omeprazole  (PRILOSEC  OTC) 20 MG tablet Take 20 mg by mouth daily as needed (acid reflux).     No current facility-administered medications on file prior to visit.    ALLERGIES: Allergies  Allergen Reactions   Azithromycin     Other  Reaction(s): GI Intolerance   Nsaids Nausea And Vomiting    Abdominal pain   Raloxifene     Other Reaction(s): GI Intolerance   Alendronate Sodium Other (See Comments)    Muscle pain    Augmentin [Amoxicillin-Pot Clavulanate] Other (See Comments)    Abdominal pain and chest pains   Ezetimibe -Simvastatin Other (See Comments)    Muscle pain, tolerates ezetimibe  by itself    Statins Other (See Comments)    Bones/muscle pain    FAMILY HISTORY: Family History  Problem Relation Age of Onset   Colon cancer Neg Hx     Objective:  Blood pressure 109/69, pulse 67, height 5' 4 (1.626 m), weight 177 lb (80.3 kg), SpO2 96%. General: No acute distress.  Patient appears well-groomed.   Head:  Normocephalic/atraumatic Eyes:  fundi examined but not visualized Neck: supple, left suboccipital and paraspinal tenderness, reduced range of motion Heart: regular rate and rhythm Neurological Exam: Mental status: alert and oriented to person, place, and time, speech fluent and not dysarthric,  language intact. Cranial nerves: CN I: not tested CN II: pupils equal, round and reactive to light, visual fields intact CN III, IV, VI:  full range of motion, no nystagmus, no ptosis CN V: facial sensation intact. CN VII: upper and lower face symmetric CN VIII: hearing intact CN IX, X: gag intact, uvula midline CN XI: sternocleidomastoid and trapezius muscles intact CN XII: tongue midline Bulk & Tone: normal, no fasciculations. Motor:  muscle strength 5/5 throughout Sensation:  Pinprick and vibratory sensation intact. Deep Tendon Reflexes:  2+ throughout,  toes downgoing.   Finger to nose testing:  Without dysmetria.   Gait:  Normal station and stride.  Romberg negative.    Thank you for allowing me to take part in the care of this patient.  Juliene Dunnings, DO  CC: Jacqulyn Ahle, DO

## 2024-02-27 ENCOUNTER — Encounter: Payer: Self-pay | Admitting: Neurology

## 2024-02-27 ENCOUNTER — Ambulatory Visit: Admitting: Neurology

## 2024-02-27 VITALS — BP 109/69 | HR 67 | Ht 64.0 in | Wt 177.0 lb

## 2024-02-27 DIAGNOSIS — M5481 Occipital neuralgia: Secondary | ICD-10-CM | POA: Diagnosis not present

## 2024-02-27 NOTE — Patient Instructions (Signed)
 I will refer you to physical medicine and rehab for consideration of an occipital nerve block Follow up afterward.  If no improvement, consider alternative treatment such as medication Once you get an appointment with physical medicine and rehab, contact us  so I can schedule you a follow up with me in one month to re-evaluate    Back of the Head Pain (Occipital Neuralgia): What to Know  Occipital neuralgia is a headache that causes very bad pain in the back of the head. This pain may spread to other parts of the head. The pain may be caused by irritation of the nerves in the spinal cord. The nerves are found just below the base of the head. These nerves transmit feeling from the back of the head, the top of the head, and the areas behind the ears. What are the causes? This headache can happen without any cause. This is known as primary headache syndrome. In some cases, the pain may be caused by problems that irritate or put pressure on the nerves. These include: A muscle spasm in the neck. Neck injury. Arthritis, or wear and tear of the neck bones. Disease of the disks that separate the neck bones. Swollen blood vessels that put pressure on the nerves. Infections. Tumors. What are the signs or symptoms? This problem causes brief pain that feels like burning or stabbing, or like an electric shooting pain in the back of the head. This pain may spread to the top of the head. It can happen on one side or both sides of the head. You may also feel: Pain behind the eye. Pain when you move your neck, or when you brush your hair. Soreness in the scalp. Aching in the back of the head between times of very bad pain. Pain that gets worse when you're around bright lights. How is this diagnosed?  This problem may be diagnosed based on your symptoms and a physical exam. A health care provider may put pressure on certain nerves in the neck to find where the pain is. Other tests may also be done, such  as: MRI or CT scan of the brain and neck. Shots of medicine into the nerves to numb the area and see if the pain goes away. This is called a nerve block. How is this treated? Treatment for this condition may begin with rest, massage, and over-the-counter pain medicine. You may also use an ice pack or a heating pad. If these treatments don't work, you may need stronger medicines that are prescribed by your provider. These include: Medicines to treat swelling. Medicines to relax your muscles. Medicines for seizures that also treat pain. Medicines for depression that also treat pain. Shots to numb the area and lessen swelling. These are called anesthetics or steroids. Pulsed radiofrequency ablation. This is when wires are used to give electrical signals that block pain from the nerves. Surgery to relieve pressure on the nerves. Physical therapy. Follow these instructions at home: Managing pain     Stop activities that cause pain, if possible. Rest when you have an attack of pain. Try gentle massage to decrease the pain. Try a different pillow or sleeping position. Use heat as told. Use the heat source that your provider recommends, such as a moist heat pack or a heating pad. Do this as often as told. Place a towel between your skin and the heat source. Leave the heat on for 20-30 minutes. Use ice or an ice pack as told. Place a towel between  your skin and the ice. Leave the ice on for 20 minutes, 2-3 times a day. If your skin turns red, take off the ice or heat right away to prevent skin damage. The risk of damage is higher if you can't feel pain, heat, or cold. General instructions Take your medicines only as told. Avoid things that make your pain worse, like bright lights. Try to stay active. Get regular exercise that doesn't cause pain. Ask your provider to suggest safe exercises for you. Work with a physical therapist to learn stretching exercises you can do at home. Use good  posture. Contact a health care provider if: Your medicine is not working. You have new or worse symptoms. Get help right away if: You have very bad head pain that doesn't go away. You have a sudden change in vision, balance, or speech. These symptoms may be an emergency. Call 911 right away. Do not wait to see if the symptoms will go away. Do not drive yourself to the hospital. This information is not intended to replace advice given to you by your health care provider. Make sure you discuss any questions you have with your health care provider. Document Revised: 12/07/2022 Document Reviewed: 12/07/2022 Elsevier Patient Education  2025 [employer].

## 2024-03-03 ENCOUNTER — Ambulatory Visit: Admitting: Family Medicine

## 2024-03-03 VITALS — BP 124/71 | HR 66 | Temp 97.7°F | Ht 64.0 in | Wt 175.0 lb

## 2024-03-03 DIAGNOSIS — F32A Depression, unspecified: Secondary | ICD-10-CM | POA: Diagnosis not present

## 2024-03-03 DIAGNOSIS — F419 Anxiety disorder, unspecified: Secondary | ICD-10-CM | POA: Diagnosis not present

## 2024-03-03 DIAGNOSIS — I10 Essential (primary) hypertension: Secondary | ICD-10-CM | POA: Diagnosis not present

## 2024-03-03 DIAGNOSIS — I4891 Unspecified atrial fibrillation: Secondary | ICD-10-CM | POA: Diagnosis not present

## 2024-03-03 MED ORDER — FLUOXETINE HCL 10 MG PO TABS: 10.0000 mg | ORAL_TABLET | Freq: Every day | ORAL | 3 refills | Status: AC

## 2024-03-03 NOTE — Patient Instructions (Signed)
Follow-up in 6 months    Merry Christmas!

## 2024-03-04 DIAGNOSIS — F419 Anxiety disorder, unspecified: Secondary | ICD-10-CM | POA: Insufficient documentation

## 2024-03-04 NOTE — Assessment & Plan Note (Addendum)
Stable and at goal. Continue current medications

## 2024-03-04 NOTE — Assessment & Plan Note (Signed)
 Stable on fluoxetine .  Refilled.

## 2024-03-04 NOTE — Assessment & Plan Note (Signed)
 Stable.  Due for medications.

## 2024-03-04 NOTE — Progress Notes (Signed)
 Subjective:  Patient ID: Patricia Hodges, female    DOB: 30-Aug-1945  Age: 78 y.o. MRN: [id number]  CC: Follow-up   HPI:  78 year old female presents for follow-up  BP well-controlled.  A-fib stable.  Recently diagnosed with occipital neuralgia.  Referral has been made to physical medicine and rehabilitation.  Mood stable on fluoxetine .  Needs refill.  Patient Active Problem List   Diagnosis Date Noted   Anxiety and depression 03/04/2024   Atrial fibrillation (HCC) 12/03/2023   History of Helicobacter pylori infection 12/03/2023   Essential hypertension 12/03/2023   Hypothyroidism 12/03/2023   Cervical spondylosis 12/03/2023   Occipital pain 12/03/2023   Hyperlipidemia 05/26/2021   History of colonic polyps    GERD 11/17/2007    Social Hx   Social History   Socioeconomic History   Marital status: Married    Spouse name: Not on file   Number of children: Not on file   Years of education: Not on file   Highest education level: Not on file  Occupational History   Not on file  Tobacco Use   Smoking status: Former    Current packs/day: 0.00    Average packs/day: 1 pack/day for 20.0 years (20.0 ttl pk-yrs)    Types: Cigarettes    Start date: 11/23/1958    Quit date: 11/23/1978    Years since quitting: 45.3   Smokeless tobacco: Never   Tobacco comments:    quit about 30 yrs ago  Vaping Use   Vaping status: Never Used  Substance and Sexual Activity   Alcohol use: No   Drug use: No   Sexual activity: Not on file  Other Topics Concern   Not on file  Social History Narrative   Not on file   Social Drivers of Health   Tobacco Use: Medium Risk (02/27/2024)   Patient History    Smoking Tobacco Use: Former    Smokeless Tobacco Use: Never    Passive Exposure: Not on Actuary Strain: Not on file  Food Insecurity: Not on file  Transportation Needs: Not on file  Physical Activity: Not on file  Stress: Not on file  Social Connections: Not on  file  Depression (PHQ2-9): Low Risk (03/03/2024)   Depression (PHQ2-9)    PHQ-2 Score: 0  Alcohol Screen: Not on file  Housing: Not on file  Utilities: Not on file  Health Literacy: Not on file    Review of Systems Per HPI  Objective:  BP 124/71   Pulse 66   Temp 97.7 F (36.5 C)   Ht 5' 4 (1.626 m)   Wt 175 lb (79.4 kg)   SpO2 95%   BMI 30.04 kg/m      03/03/2024    1:22 PM 02/27/2024    9:07 AM 01/30/2024    2:46 PM  BP/Weight  Systolic BP 124 109 126  Diastolic BP 71 69 70  Wt. (Lbs) 175 177 180.6  BMI 30.04 kg/m2 30.38 kg/m2 31 kg/m2    Physical Exam Vitals and nursing note reviewed.  Constitutional:      General: She is not in acute distress.    Appearance: Normal appearance.  HENT:     Head: Normocephalic and atraumatic.  Eyes:     General:        Right eye: No discharge.        Left eye: No discharge.     Conjunctiva/sclera: Conjunctivae normal.  Cardiovascular:     Rate and Rhythm: Normal rate  and regular rhythm.  Pulmonary:     Effort: Pulmonary effort is normal.     Breath sounds: Normal breath sounds. No wheezing, rhonchi or rales.  Neurological:     Mental Status: She is alert.  Psychiatric:        Mood and Affect: Mood normal.        Behavior: Behavior normal.     Lab Results  Component Value Date   WBC 8.5 12/03/2023   HGB 15.0 12/03/2023   HCT 46.2 12/03/2023   PLT 297 12/03/2023   GLUCOSE 98 12/03/2023   CHOL 228 (H) 12/03/2023   TRIG 157 (H) 12/03/2023   HDL 40 12/03/2023   LDLCALC 159 (H) 12/03/2023   ALT 22 12/03/2023   AST 25 12/03/2023   NA 139 12/03/2023   K 4.6 12/03/2023   CL 103 12/03/2023   CREATININE 0.97 12/03/2023   BUN 15 12/03/2023   CO2 22 12/03/2023   TSH 1.990 12/03/2023   INR 2.8 06/05/2023     Assessment & Plan:  Essential hypertension Assessment & Plan: Stable and at goal.  Continue current medications.   Atrial fibrillation, unspecified type [facility]) Assessment & Plan: Stable.  Due for  medications.   Anxiety and depression Assessment & Plan: Stable on fluoxetine .  Refilled.  Orders: -     FLUoxetine  HCl; Take 1 tablet (10 mg total) by mouth daily.  Dispense: 90 tablet; Refill: 3    Follow-up: 6 months  Takoya Jonas Bluford DO [facility]

## 2024-03-27 ENCOUNTER — Encounter: Payer: Self-pay | Admitting: Physical Medicine & Rehabilitation

## 2024-04-08 ENCOUNTER — Ambulatory Visit: Admitting: Neurology

## 2024-04-23 ENCOUNTER — Ambulatory Visit: Admitting: Orthopedic Surgery

## 2024-04-23 ENCOUNTER — Other Ambulatory Visit: Payer: Self-pay

## 2024-04-23 DIAGNOSIS — M25511 Pain in right shoulder: Secondary | ICD-10-CM

## 2024-04-23 DIAGNOSIS — M19011 Primary osteoarthritis, right shoulder: Secondary | ICD-10-CM

## 2024-04-23 DIAGNOSIS — M5412 Radiculopathy, cervical region: Secondary | ICD-10-CM

## 2024-04-23 MED ORDER — OXYCODONE-ACETAMINOPHEN 5-325 MG PO TABS: ORAL_TABLET | ORAL | 0 refills | Status: AC

## 2024-04-23 NOTE — Progress Notes (Unsigned)
 "  Office Visit Note   Patient: Patricia Hodges           Date of Birth: [birth year]           MRN: [id number] Visit Date: 04/23/2024 Requested by: Cook, Jayce G, DO [address] [place],  [place] [address] PCP: Patricia Jacqulyn KANDICE, DO  Subjective: Chief Complaint  Patient presents with   Right Shoulder - Pain    HPI: Patricia Hodges is a 79 y.o. female who presents to the office reporting ***.                ROS: All systems reviewed are negative as they relate to the chief complaint within the history of present illness.  Patient denies fevers or chills.  Assessment & Plan: Visit Diagnoses:  1. Right shoulder pain, unspecified chronicity     Plan: ***  Follow-Up Instructions: No follow-ups on file.   Orders:  Orders Placed This Encounter  Procedures   XR Shoulder Right   US  Guided Needle Placement - No Linked Charges   No orders of the defined types were placed in this encounter.     Procedures: Medium Joint Inj: R acromioclavicular on 04/23/2024 2:02 PM Indications: diagnostic evaluation and pain Details: 22 Hodges 1.5 in needle, ultrasound-guided superior approach Medications: 3 mL lidocaine  1 %; 0.66 mL bupivacaine  0.25 %; 15 mg triamcinolone  acetonide 40 MG/ML Outcome: tolerated well, no immediate complications Procedure, treatment alternatives, risks and benefits explained, specific risks discussed. Consent was given by the patient. Immediately prior to procedure a time out was called to verify the correct patient, procedure, equipment, support staff and site/side marked as required. Patient was prepped and draped in the usual sterile fashion.       Clinical Data: No additional findings.  Objective: Vital Signs: There were no vitals taken for this visit.  Physical Exam:  Constitutional: Patient appears well-developed HEENT:  Head: Normocephalic Eyes:EOM are normal Neck: Normal range of motion Cardiovascular: Normal rate Pulmonary/chest: Effort  normal Neurologic: Patient is alert Skin: Skin is warm Psychiatric: Patient has normal mood and affect  Ortho Exam: ***  Specialty Comments:  Narrative & Impression CLINICAL DATA:  Left upper extremity radicular symptoms.  Neck pain   EXAM: MRI CERVICAL SPINE WITHOUT CONTRAST   TECHNIQUE: Multiplanar, multisequence MR imaging of the cervical spine was performed. No intravenous contrast was administered.   COMPARISON:  X-ray 04/26/2023, MRI 12/14/2015   FINDINGS: Alignment: Straightening of the cervical lordosis. Grade 1 anterolisthesis of C7 on T1.   Vertebrae: C5-6 ACDF with solid arthrodesis. No fracture. No evidence of discitis. No marrow replacing bone lesion.   Cord: Normal signal and morphology.   Posterior Fossa, vertebral arteries, paraspinal tissues: Negative.   Disc levels:   C2-C3: Unremarkable.   C3-C4: Mild disc bulge with left greater than right facet and uncovertebral arthropathy. Moderate left and mild right foraminal stenosis. Mild canal stenosis. Minimal interval progression.   C4-C5: Disc osteophyte complex with bilateral facet and uncovertebral arthropathy. Moderate canal stenosis with moderate bilateral foraminal stenosis, left worse than right. Findings have progressed from prior.   C5-C6: ACDF. Bony spurring contributes to mild-moderate right foraminal stenosis. No significant canal stenosis. Improved.   C6-C7: Disc osteophyte complex with bilateral uncovertebral spurring. Mild-moderate bilateral foraminal stenosis. No canal stenosis. Minimal interval progression.   C7-T1: Bilateral facet arthropathy and mild disc uncovering. No canal stenosis. Mild left foraminal stenosis. Minimal interval progression.   IMPRESSION: 1. Multilevel cervical spondylosis, progressed since  2017. 2. Moderate canal stenosis and moderate bilateral foraminal stenosis at C4-5. 3. Mild canal stenosis and moderate left foraminal stenosis at C3-4. 4.  Mild-moderate bilateral foraminal stenosis at C6-7. 5. C5-6 ACDF with solid arthrodesis.     Electronically Signed   By: Patricia Hodges D.O.   On: 05/24/2023 09:10  Imaging: US  Guided Needle Placement - No Linked Charges Result Date: 04/23/2024 Ultrasound imaging demonstrates needle placement into the [facility] joint with injection of fluid into the joint and no complicating features Right shoulder  XR Shoulder Right Result Date: 04/23/2024 [facility] axillary outlet radiograph right shoulder reviewed.  No fracture.  Moderate to severe AC joint arthritis is present.  No glenohumeral joint arthritis.  Acromiohumeral distance maintained    PMFS History: Patient Active Problem List   Diagnosis Date Noted   Anxiety and depression 03/04/2024   Atrial fibrillation (HCC) 12/03/2023   History of Helicobacter pylori infection 12/03/2023   Essential hypertension 12/03/2023   Hypothyroidism 12/03/2023   Cervical spondylosis 12/03/2023   Occipital pain 12/03/2023   Hyperlipidemia 05/26/2021   History of colonic polyps    GERD 11/17/2007   Past Medical History:  Diagnosis Date   Atrial fibrillation (HCC)    a. diagnosed in 05/2021 and underwent successful DCCV in 08/2021   Basal cell carcinoma 04/25/2017   nod- left lower back (CX35FU)   Diverticulosis    Dysphagia    Esophageal reflux    Gastroenteritis, eosinophilic    H pylori ulcer    Hyperlipidemia    Osteoarthritis    S/P colonoscopy 2006   diverticulosis, due for repeat in 2016   S/P endoscopy 2007   Schatzki's Ring   SCC (squamous cell carcinoma) 07/22/2019   Left anterior neck- (cx40fu)   Squamous cell carcinoma of skin 02/06/2011   in situ (CX35FU)    Family History  Problem Relation Age of Onset   Colon cancer Neg Hx     Past Surgical History:  Procedure Laterality Date   ANTERIOR CERVICAL DECOMP/DISCECTOMY FUSION N/A 01/30/2016   Procedure: C5-6 Anterior Cervical Discectomy and Fusion, Allograft,  Plate;  Surgeon: Patricia JAYSON Herald, MD;  Location: [facility] OR;  Service: Orthopedics;  Laterality: N/A;   bladder tack     BUNIONECTOMY Right    CARDIOVERSION N/A 08/17/2021   Procedure: CARDIOVERSION;  Surgeon: Patricia Dorn FALCON, MD;  Location: [facility];  Service: Endoscopy;  Laterality: N/A;   CARPAL TUNNEL RELEASE Left    CATARACT EXTRACTION W/PHACO Right 11/28/2015   Procedure: CATARACT EXTRACTION PHACO AND INTRAOCULAR LENS PLACEMENT (IOC) RIGHT;  Surgeon: Cherene Mania, MD;  Location: [facility];  Service: Ophthalmology;  Laterality: Right;  CDE: 6.96   CATARACT EXTRACTION W/PHACO Left 12/15/2015   Procedure: CATARACT EXTRACTION PHACO AND INTRAOCULAR LENS PLACEMENT LEFT EYE CDE=9.04;  Surgeon: Cherene Mania, MD;  Location: [facility];  Service: Ophthalmology;  Laterality: Left;  left   CESAREAN SECTION     CHOLECYSTECTOMY     COLONOSCOPY  2006   COLONOSCOPY N/A 09/08/2014   Procedure: COLONOSCOPY;  Surgeon: Lamar CHRISTELLA Hollingshead, MD;  Location: [facility];  Service: Endoscopy;  Laterality: N/A;  10:30 AM   COLONOSCOPY N/A 11/20/2017   Procedure: COLONOSCOPY;  Surgeon: Hollingshead Lamar CHRISTELLA, MD;  Location: [facility];  Service: Endoscopy;  Laterality: N/A;  10:30   feet surgery     HAND SURGERY Right    on ring finger due to swollen knuckle   Hx schatski's ring     POLYPECTOMY  11/20/2017  Procedure: POLYPECTOMY;  Surgeon: Shaaron Lamar HERO, MD;  Location: [facility];  Service: Endoscopy;;  ascending colon (CSx1) descending colon(CSx1)   S/P Hysterectomy     Social History   Occupational History   Not on file  Tobacco Use   Smoking status: Former    Current packs/day: 0.00    Average packs/day: 1 pack/day for 20.0 years (20.0 ttl pk-yrs)    Types: Cigarettes    Start date: 11/23/1958    Quit date: 11/23/1978    Years since quitting: 45.4   Smokeless tobacco: Never   Tobacco comments:    quit about 30 yrs ago  Vaping Use   Vaping status: Never Used  Substance and Sexual Activity   Alcohol  use: No   Drug use: No   Sexual activity: Not on file        "

## 2024-04-24 ENCOUNTER — Telehealth: Payer: Self-pay

## 2024-04-24 NOTE — Telephone Encounter (Signed)
"  ° °  Pre-operative Risk Assessment    Patient Name: Patricia Hodges  DOB: [birth year] MRN: [id number]   Date of last office visit: 07/25/23 Date of next office visit: Not scheduled   Request for Surgical Clearance    Procedure:  Epidural steroid injection  Date of Surgery:  Clearance TBD                                Surgeon:  Not provided Surgeon's Group or Practice Name:  [facility] Phone number:  [phone] Fax number:  [phone]   Type of Clearance Requested:   - Medical  - Pharmacy:  Hold Warfarin (Coumadin )     Type of Anesthesia:  Not Indicated   Additional requests/questions:    [name]   04/24/2024, 4:57 PM   "

## 2024-05-07 ENCOUNTER — Encounter: Admitting: Physical Medicine & Rehabilitation

## 2024-09-01 ENCOUNTER — Ambulatory Visit: Admitting: Family Medicine
